# Patient Record
Sex: Male | Born: 1965 | Race: Black or African American | Hispanic: No | Smoking: Current some day smoker
Health system: Southern US, Community
[De-identification: ages and names within clinical notes are randomized; demographics above are authoritative.]

## PROBLEM LIST (undated history)

## (undated) DIAGNOSIS — K219 Gastro-esophageal reflux disease without esophagitis: Secondary | ICD-10-CM

## (undated) DIAGNOSIS — I509 Heart failure, unspecified: Secondary | ICD-10-CM

## (undated) DIAGNOSIS — I2510 Atherosclerotic heart disease of native coronary artery without angina pectoris: Secondary | ICD-10-CM

## (undated) DIAGNOSIS — I240 Acute coronary thrombosis not resulting in myocardial infarction: Secondary | ICD-10-CM

## (undated) DIAGNOSIS — E785 Hyperlipidemia, unspecified: Secondary | ICD-10-CM

## (undated) DIAGNOSIS — I10 Essential (primary) hypertension: Secondary | ICD-10-CM

## (undated) DIAGNOSIS — I255 Ischemic cardiomyopathy: Secondary | ICD-10-CM

## (undated) DIAGNOSIS — Z720 Tobacco use: Secondary | ICD-10-CM

## (undated) HISTORY — DX: Acute coronary thrombosis not resulting in myocardial infarction: I24.0

## (undated) HISTORY — PX: WISDOM TOOTH EXTRACTION: SHX21

---

## 1999-03-05 ENCOUNTER — Emergency Department (HOSPITAL_COMMUNITY): Admission: EM | Admit: 1999-03-05 | Discharge: 1999-03-05 | Payer: Self-pay

## 2003-07-27 ENCOUNTER — Emergency Department (HOSPITAL_COMMUNITY): Admission: EM | Admit: 2003-07-27 | Discharge: 2003-07-27 | Payer: Self-pay | Admitting: Emergency Medicine

## 2003-07-27 ENCOUNTER — Encounter: Payer: Self-pay | Admitting: Emergency Medicine

## 2004-01-03 ENCOUNTER — Emergency Department (HOSPITAL_COMMUNITY): Admission: EM | Admit: 2004-01-03 | Discharge: 2004-01-03 | Payer: Self-pay | Admitting: Emergency Medicine

## 2004-03-09 ENCOUNTER — Emergency Department (HOSPITAL_COMMUNITY): Admission: EM | Admit: 2004-03-09 | Discharge: 2004-03-09 | Payer: Self-pay | Admitting: Emergency Medicine

## 2004-03-09 ENCOUNTER — Emergency Department (HOSPITAL_COMMUNITY): Admission: EM | Admit: 2004-03-09 | Discharge: 2004-03-09 | Payer: Self-pay | Admitting: *Deleted

## 2007-09-08 ENCOUNTER — Emergency Department (HOSPITAL_COMMUNITY): Admission: EM | Admit: 2007-09-08 | Discharge: 2007-09-08 | Payer: Self-pay | Admitting: Emergency Medicine

## 2007-09-12 ENCOUNTER — Emergency Department (HOSPITAL_COMMUNITY): Admission: EM | Admit: 2007-09-12 | Discharge: 2007-09-12 | Payer: Self-pay | Admitting: Emergency Medicine

## 2008-07-25 ENCOUNTER — Emergency Department (HOSPITAL_COMMUNITY): Admission: EM | Admit: 2008-07-25 | Discharge: 2008-07-26 | Payer: Self-pay | Admitting: Emergency Medicine

## 2008-10-31 DIAGNOSIS — E1159 Type 2 diabetes mellitus with other circulatory complications: Secondary | ICD-10-CM

## 2008-10-31 HISTORY — DX: Type 2 diabetes mellitus with other circulatory complications: E11.59

## 2008-11-19 ENCOUNTER — Emergency Department (HOSPITAL_COMMUNITY): Admission: EM | Admit: 2008-11-19 | Discharge: 2008-11-20 | Payer: Self-pay | Admitting: Emergency Medicine

## 2008-12-08 ENCOUNTER — Emergency Department (HOSPITAL_COMMUNITY): Admission: EM | Admit: 2008-12-08 | Discharge: 2008-12-08 | Payer: Self-pay | Admitting: Emergency Medicine

## 2009-01-19 ENCOUNTER — Ambulatory Visit: Payer: Self-pay | Admitting: Nurse Practitioner

## 2009-01-19 DIAGNOSIS — F17200 Nicotine dependence, unspecified, uncomplicated: Secondary | ICD-10-CM

## 2009-01-19 LAB — CONVERTED CEMR LAB
Blood in Urine, dipstick: NEGATIVE
Glucose, Urine, Semiquant: >=1000
Hgb A1c MFr Bld: 13.9 %
Specific Gravity, Urine: 1.015
WBC Urine, dipstick: NEGATIVE
pH: 5

## 2009-01-20 ENCOUNTER — Encounter (INDEPENDENT_AMBULATORY_CARE_PROVIDER_SITE_OTHER): Payer: Self-pay | Admitting: Nurse Practitioner

## 2009-01-20 LAB — CONVERTED CEMR LAB
ALT: 19 U/L
AST: 12 U/L
Albumin: 4.5 g/dL
Alkaline Phosphatase: 98 U/L
BUN: 12 mg/dL
Basophils Absolute: 0 K/uL
Basophils Relative: 1 %
CO2: 25 meq/L
CRP: 0.6 mg/dL — ABNORMAL HIGH
Calcium: 9.5 mg/dL
Chloride: 97 meq/L
Cholesterol: 371 mg/dL — ABNORMAL HIGH
Creatinine, Ser: 1.13 mg/dL
Eosinophils Absolute: 0.1 K/uL
Eosinophils Relative: 2 %
Glucose, Bld: 362 mg/dL — ABNORMAL HIGH
HCT: 50.9 %
HDL: 37 mg/dL — ABNORMAL LOW
Hemoglobin: 17.7 g/dL — ABNORMAL HIGH
Insulin: 11 u[IU]/mL
Lymphocytes Relative: 44 %
Lymphs Abs: 2.3 K/uL
MCHC: 34.8 g/dL
MCV: 93.2 fL
Microalb, Ur: 6.09 mg/dL — ABNORMAL HIGH
Monocytes Absolute: 0.4 K/uL
Monocytes Relative: 8 %
Neutro Abs: 2.4 K/uL
Neutrophils Relative %: 46 %
PSA: 1.31 ng/mL
Platelets: 189 K/uL
Potassium: 4.8 meq/L
RBC: 5.46 M/uL
RDW: 13.5 %
Sodium: 135 meq/L
TSH: 0.859 u[IU]/mL
Total Bilirubin: 0.5 mg/dL
Total CHOL/HDL Ratio: 10
Total Protein: 7.3 g/dL
Triglycerides: 890 mg/dL — ABNORMAL HIGH
WBC: 5.2 10*3/microliter

## 2009-01-21 ENCOUNTER — Ambulatory Visit: Payer: Self-pay | Admitting: *Deleted

## 2009-01-27 ENCOUNTER — Emergency Department (HOSPITAL_COMMUNITY): Admission: EM | Admit: 2009-01-27 | Discharge: 2009-01-28 | Payer: Self-pay | Admitting: Emergency Medicine

## 2009-02-23 ENCOUNTER — Ambulatory Visit: Payer: Self-pay | Admitting: Nurse Practitioner

## 2009-02-23 LAB — CONVERTED CEMR LAB
Blood in Urine, dipstick: NEGATIVE
HDL goal, serum: 40 mg/dL
Hgb A1c MFr Bld: 13 %
LDL Goal: 100 mg/dL
Protein, U semiquant: 30
Urobilinogen, UA: 0.2
WBC Urine, dipstick: NEGATIVE

## 2009-03-22 ENCOUNTER — Emergency Department (HOSPITAL_COMMUNITY): Admission: EM | Admit: 2009-03-22 | Discharge: 2009-03-23 | Payer: Self-pay | Admitting: Emergency Medicine

## 2009-03-24 ENCOUNTER — Emergency Department (HOSPITAL_COMMUNITY): Admission: EM | Admit: 2009-03-24 | Discharge: 2009-03-25 | Payer: Self-pay | Admitting: Emergency Medicine

## 2009-04-18 ENCOUNTER — Emergency Department (HOSPITAL_COMMUNITY): Admission: EM | Admit: 2009-04-18 | Discharge: 2009-04-18 | Payer: Self-pay | Admitting: Emergency Medicine

## 2009-05-07 ENCOUNTER — Emergency Department (HOSPITAL_COMMUNITY): Admission: EM | Admit: 2009-05-07 | Discharge: 2009-05-07 | Payer: Self-pay | Admitting: Emergency Medicine

## 2010-06-22 ENCOUNTER — Emergency Department (HOSPITAL_COMMUNITY): Admission: EM | Admit: 2010-06-22 | Discharge: 2010-06-23 | Payer: Self-pay | Admitting: Emergency Medicine

## 2010-08-14 ENCOUNTER — Emergency Department (HOSPITAL_COMMUNITY): Admission: EM | Admit: 2010-08-14 | Discharge: 2010-08-14 | Payer: Self-pay | Admitting: Emergency Medicine

## 2010-08-17 ENCOUNTER — Emergency Department (HOSPITAL_COMMUNITY): Admission: EM | Admit: 2010-08-17 | Discharge: 2010-08-17 | Payer: Self-pay | Admitting: Emergency Medicine

## 2010-08-21 ENCOUNTER — Emergency Department (HOSPITAL_COMMUNITY): Admission: EM | Admit: 2010-08-21 | Discharge: 2010-08-21 | Payer: Self-pay | Admitting: Emergency Medicine

## 2011-01-21 ENCOUNTER — Encounter: Payer: Self-pay | Admitting: Cardiology

## 2011-01-24 ENCOUNTER — Emergency Department (HOSPITAL_COMMUNITY)
Admission: EM | Admit: 2011-01-24 | Discharge: 2011-01-24 | Payer: Self-pay | Source: Home / Self Care | Admitting: Emergency Medicine

## 2011-02-22 ENCOUNTER — Emergency Department (HOSPITAL_COMMUNITY)
Admission: EM | Admit: 2011-02-22 | Discharge: 2011-02-22 | Disposition: A | Discharge status: Home / Self Care | Payer: Medicaid Other | Attending: Emergency Medicine | Admitting: Emergency Medicine

## 2011-02-22 DIAGNOSIS — K089 Disorder of teeth and supporting structures, unspecified: Secondary | ICD-10-CM | POA: Insufficient documentation

## 2011-02-22 DIAGNOSIS — K029 Dental caries, unspecified: Secondary | ICD-10-CM | POA: Insufficient documentation

## 2011-02-22 DIAGNOSIS — E119 Type 2 diabetes mellitus without complications: Secondary | ICD-10-CM | POA: Insufficient documentation

## 2011-02-22 DIAGNOSIS — R220 Localized swelling, mass and lump, head: Secondary | ICD-10-CM | POA: Insufficient documentation

## 2011-02-22 DIAGNOSIS — R221 Localized swelling, mass and lump, neck: Secondary | ICD-10-CM | POA: Insufficient documentation

## 2011-02-22 DIAGNOSIS — Z79899 Other long term (current) drug therapy: Secondary | ICD-10-CM | POA: Insufficient documentation

## 2011-03-06 ENCOUNTER — Emergency Department (HOSPITAL_COMMUNITY)
Admission: EM | Admit: 2011-03-06 | Discharge: 2011-03-06 | Discharge status: Against Medical Advice | Payer: Medicaid Other | Attending: Emergency Medicine | Admitting: Emergency Medicine

## 2011-03-06 DIAGNOSIS — M79609 Pain in unspecified limb: Secondary | ICD-10-CM | POA: Insufficient documentation

## 2011-03-18 LAB — GLUCOSE, CAPILLARY: Glucose-Capillary: 449 mg/dL — ABNORMAL HIGH (ref 70–99)

## 2011-03-18 LAB — POCT I-STAT, CHEM 8
BUN: 15 mg/dL (ref 6–23)
Calcium, Ion: 1.11 mmol/L — ABNORMAL LOW (ref 1.12–1.32)
Chloride: 105 mEq/L (ref 96–112)
HCT: 45 % (ref 39.0–52.0)
Potassium: 3.9 mEq/L (ref 3.5–5.1)
Sodium: 137 mEq/L (ref 135–145)

## 2011-03-18 LAB — URINALYSIS, ROUTINE W REFLEX MICROSCOPIC
Bilirubin Urine: NEGATIVE
Glucose, UA: >1000 mg/dL — AB
Ketones, ur: NEGATIVE mg/dL
Leukocytes, UA: NEGATIVE
Protein, ur: NEGATIVE mg/dL

## 2011-03-27 ENCOUNTER — Emergency Department (HOSPITAL_COMMUNITY)
Admission: EM | Admit: 2011-03-27 | Discharge: 2011-03-27 | Disposition: A | Discharge status: Home / Self Care | Payer: Medicaid Other | Attending: Emergency Medicine | Admitting: Emergency Medicine

## 2011-03-27 DIAGNOSIS — K089 Disorder of teeth and supporting structures, unspecified: Secondary | ICD-10-CM | POA: Insufficient documentation

## 2011-03-27 DIAGNOSIS — E119 Type 2 diabetes mellitus without complications: Secondary | ICD-10-CM | POA: Insufficient documentation

## 2011-04-11 LAB — GLUCOSE, CAPILLARY: Glucose-Capillary: 343 mg/dL — ABNORMAL HIGH (ref 70–99)

## 2011-04-12 LAB — DIFFERENTIAL
Basophils Absolute: 0 10*3/uL (ref 0.0–0.1)
Eosinophils Relative: 1 % (ref 0–5)
Lymphocytes Relative: 18 % (ref 12–46)
Lymphs Abs: 1.6 10*3/uL (ref 0.7–4.0)
Neutro Abs: 6.4 10*3/uL (ref 1.7–7.7)
Neutrophils Relative %: 72 % (ref 43–77)

## 2011-04-12 LAB — COMPREHENSIVE METABOLIC PANEL
AST: 36 U/L (ref 0–37)
Albumin: 2.9 g/dL — ABNORMAL LOW (ref 3.5–5.2)
Alkaline Phosphatase: 102 U/L (ref 39–117)
BUN: 10 mg/dL (ref 6–23)
Chloride: 101 mEq/L (ref 96–112)
GFR calc Af Amer: >60 mL/min (ref >60)
Potassium: 4.8 mEq/L (ref 3.5–5.1)
Total Bilirubin: 1.6 mg/dL — ABNORMAL HIGH (ref 0.3–1.2)
Total Protein: 6.6 g/dL (ref 6.0–8.3)

## 2011-04-12 LAB — CBC
Platelets: 257 10*3/uL (ref 150–400)
RDW: 13 % (ref 11.5–15.5)
WBC: 9 10*3/uL (ref 4.0–10.5)

## 2011-04-12 LAB — GLUCOSE, CAPILLARY: Glucose-Capillary: 494 mg/dL — ABNORMAL HIGH (ref 70–99)

## 2011-04-12 LAB — POCT I-STAT, CHEM 8
Calcium, Ion: 1.15 mmol/L (ref 1.12–1.32)
Chloride: 102 mEq/L (ref 96–112)
Creatinine, Ser: 1 mg/dL (ref 0.4–1.5)
Glucose, Bld: 423 mg/dL — ABNORMAL HIGH (ref 70–99)
HCT: 44 % (ref 39.0–52.0)
Potassium: 4.1 mEq/L (ref 3.5–5.1)

## 2011-04-12 LAB — KETONES, QUALITATIVE: Acetone, Bld: NEGATIVE

## 2011-04-16 LAB — GLUCOSE, CAPILLARY: Glucose-Capillary: 342 mg/dL — ABNORMAL HIGH (ref 70–99)

## 2011-07-17 ENCOUNTER — Emergency Department (HOSPITAL_COMMUNITY)
Admission: EM | Admit: 2011-07-17 | Discharge: 2011-07-18 | Disposition: A | Discharge status: Home / Self Care | Payer: Medicaid Other | Attending: Emergency Medicine | Admitting: Emergency Medicine

## 2011-07-17 ENCOUNTER — Emergency Department (HOSPITAL_COMMUNITY)
Admission: EM | Admit: 2011-07-17 | Discharge: 2011-07-17 | Disposition: A | Discharge status: Home / Self Care | Payer: Medicaid Other | Attending: Emergency Medicine | Admitting: Emergency Medicine

## 2011-07-17 DIAGNOSIS — L03319 Cellulitis of trunk, unspecified: Secondary | ICD-10-CM | POA: Insufficient documentation

## 2011-07-17 DIAGNOSIS — E119 Type 2 diabetes mellitus without complications: Secondary | ICD-10-CM | POA: Insufficient documentation

## 2011-07-17 DIAGNOSIS — N498 Inflammatory disorders of other specified male genital organs: Secondary | ICD-10-CM | POA: Insufficient documentation

## 2011-07-17 DIAGNOSIS — L02219 Cutaneous abscess of trunk, unspecified: Secondary | ICD-10-CM | POA: Insufficient documentation

## 2011-07-17 LAB — GLUCOSE, CAPILLARY: Glucose-Capillary: 333 mg/dL — ABNORMAL HIGH (ref 70–99)

## 2011-08-08 ENCOUNTER — Emergency Department (HOSPITAL_COMMUNITY)
Admission: EM | Admit: 2011-08-08 | Discharge: 2011-08-08 | Disposition: A | Discharge status: Home / Self Care | Payer: Medicaid Other | Attending: Emergency Medicine | Admitting: Emergency Medicine

## 2011-08-08 DIAGNOSIS — E119 Type 2 diabetes mellitus without complications: Secondary | ICD-10-CM | POA: Insufficient documentation

## 2011-08-08 DIAGNOSIS — L509 Urticaria, unspecified: Secondary | ICD-10-CM | POA: Insufficient documentation

## 2011-10-02 LAB — BLOOD GAS, VENOUS
FIO2: 0.21
Patient temperature: 98.6
TCO2: 22.3
pCO2, Ven: 45.6
pH, Ven: 7.362 — ABNORMAL HIGH

## 2011-10-02 LAB — CBC
HCT: 48.1
Hemoglobin: 16.4
Platelets: 178
WBC: 7.2

## 2011-10-02 LAB — DIFFERENTIAL
Eosinophils Relative: 2
Lymphocytes Relative: 23
Lymphs Abs: 1.7
Monocytes Relative: 10

## 2011-10-02 LAB — URINALYSIS, ROUTINE W REFLEX MICROSCOPIC
Glucose, UA: >1000 — AB
Leukocytes, UA: NEGATIVE
pH: 5.5

## 2011-10-02 LAB — BASIC METABOLIC PANEL
BUN: 11
Calcium: 9.7
GFR calc non Af Amer: >60
Potassium: 4.1
Sodium: 130 — ABNORMAL LOW

## 2011-10-02 LAB — GLUCOSE, CAPILLARY: Glucose-Capillary: 149 — ABNORMAL HIGH

## 2011-10-02 LAB — URINE MICROSCOPIC-ADD ON: Urine-Other: NONE SEEN

## 2011-10-02 LAB — POCT CARDIAC MARKERS

## 2011-10-05 LAB — GLUCOSE, CAPILLARY: Glucose-Capillary: 482 mg/dL — ABNORMAL HIGH (ref 70–99)

## 2011-10-30 ENCOUNTER — Emergency Department (HOSPITAL_COMMUNITY): Payer: Medicaid Other

## 2011-10-30 ENCOUNTER — Emergency Department (HOSPITAL_COMMUNITY)
Admission: EM | Admit: 2011-10-30 | Discharge: 2011-10-30 | Disposition: A | Discharge status: Home / Self Care | Payer: Medicaid Other | Attending: Emergency Medicine | Admitting: Emergency Medicine

## 2011-10-30 ENCOUNTER — Encounter (HOSPITAL_COMMUNITY): Payer: Self-pay

## 2011-10-30 DIAGNOSIS — L03211 Cellulitis of face: Secondary | ICD-10-CM | POA: Insufficient documentation

## 2011-10-30 DIAGNOSIS — L0201 Cutaneous abscess of face: Secondary | ICD-10-CM | POA: Insufficient documentation

## 2011-10-30 DIAGNOSIS — M8448XA Pathological fracture, other site, initial encounter for fracture: Secondary | ICD-10-CM | POA: Insufficient documentation

## 2011-10-30 DIAGNOSIS — E119 Type 2 diabetes mellitus without complications: Secondary | ICD-10-CM | POA: Insufficient documentation

## 2011-10-30 DIAGNOSIS — J329 Chronic sinusitis, unspecified: Secondary | ICD-10-CM | POA: Insufficient documentation

## 2011-10-30 DIAGNOSIS — Z79899 Other long term (current) drug therapy: Secondary | ICD-10-CM | POA: Insufficient documentation

## 2011-10-30 LAB — CBC
MCH: 31.7 pg (ref 26.0–34.0)
MCV: 91.3 fL (ref 78.0–100.0)
Platelets: 203 10*3/uL (ref 150–400)
RBC: 4.96 MIL/uL (ref 4.22–5.81)
RDW: 13 % (ref 11.5–15.5)
WBC: 8.1 10*3/uL (ref 4.0–10.5)

## 2011-10-30 LAB — POCT I-STAT, CHEM 8
Calcium, Ion: 1.15 mmol/L (ref 1.12–1.32)
Glucose, Bld: 144 mg/dL — ABNORMAL HIGH (ref 70–99)
HCT: 47 % (ref 39.0–52.0)
TCO2: 26 mmol/L (ref 0–100)

## 2011-10-30 LAB — DIFFERENTIAL
Basophils Relative: 0 % (ref 0–1)
Eosinophils Absolute: 0.2 10*3/uL (ref 0.0–0.7)
Eosinophils Relative: 3 % (ref 0–5)
Lymphs Abs: 3 10*3/uL (ref 0.7–4.0)
Neutrophils Relative %: 48 % (ref 43–77)

## 2011-10-30 LAB — GLUCOSE, CAPILLARY: Glucose-Capillary: 137 mg/dL — ABNORMAL HIGH (ref 70–99)

## 2012-01-14 ENCOUNTER — Encounter (HOSPITAL_COMMUNITY): Payer: Self-pay

## 2012-01-14 ENCOUNTER — Emergency Department (HOSPITAL_COMMUNITY)
Admission: EM | Admit: 2012-01-14 | Discharge: 2012-01-14 | Discharge status: Against Medical Advice | Payer: Medicaid Other | Attending: Emergency Medicine | Admitting: Emergency Medicine

## 2012-01-14 DIAGNOSIS — L0291 Cutaneous abscess, unspecified: Secondary | ICD-10-CM | POA: Insufficient documentation

## 2012-01-14 NOTE — ED Notes (Signed)
Pt decided to leave and to call doctor in the morning

## 2012-01-14 NOTE — ED Notes (Signed)
Boil to rt side of neck and on head-  Rt neck boil with pus drainage

## 2012-04-01 ENCOUNTER — Emergency Department (HOSPITAL_COMMUNITY)
Admission: EM | Admit: 2012-04-01 | Discharge: 2012-04-01 | Disposition: A | Discharge status: Home / Self Care | Payer: Medicaid Other | Attending: Emergency Medicine | Admitting: Emergency Medicine

## 2012-04-01 ENCOUNTER — Encounter (HOSPITAL_COMMUNITY): Payer: Self-pay

## 2012-04-01 DIAGNOSIS — K029 Dental caries, unspecified: Secondary | ICD-10-CM | POA: Insufficient documentation

## 2012-04-01 DIAGNOSIS — K0889 Other specified disorders of teeth and supporting structures: Secondary | ICD-10-CM

## 2012-04-01 DIAGNOSIS — F17200 Nicotine dependence, unspecified, uncomplicated: Secondary | ICD-10-CM | POA: Insufficient documentation

## 2012-04-01 DIAGNOSIS — E119 Type 2 diabetes mellitus without complications: Secondary | ICD-10-CM | POA: Insufficient documentation

## 2012-04-01 MED ORDER — PENICILLIN V POTASSIUM 500 MG PO TABS: 500.0000 mg | ORAL_TABLET | Freq: Four times a day (QID) | ORAL | Status: AC

## 2012-04-01 MED ORDER — OXYCODONE-ACETAMINOPHEN 5-325 MG PO TABS: 1.0000 | ORAL_TABLET | Freq: Four times a day (QID) | ORAL | Status: AC | PRN

## 2012-04-01 MED ORDER — OXYCODONE-ACETAMINOPHEN 5-325 MG PO TABS
2.0000 | ORAL_TABLET | Freq: Once | ORAL | Status: AC
  Administered 2012-04-01: 2 via ORAL
  Filled 2012-04-01: qty 2

## 2012-04-01 NOTE — ED Provider Notes (Signed)
Medical screening examination/treatment/procedure(s) were performed by non-physician practitioner and as supervising physician I was immediately available for consultation/collaboration.   Dayton Bailiff, MD 04/01/12 [phone]

## 2012-04-01 NOTE — ED Provider Notes (Signed)
History     CSN: [id number]  Arrival date & time 04/01/12  2041   First MD Initiated Contact with Patient 04/01/12 2145      Chief Complaint  Patient presents with  . Dental Pain    (Consider location/radiation/quality/duration/timing/severity/associated sxs/prior treatment) HPI Comments: Patient does not have a dentist.  Dental pain of the right lower premolar teeth.  Patient is a 47 y.o. male presenting with tooth pain. The history is provided by the patient.  Dental PainPrimary symptoms do not include dental injury, oral bleeding, oral lesions, fever or shortness of breath. The symptoms began yesterday. The symptoms are worsening. The symptoms occur constantly.  Additional symptoms include: gum swelling and gum tenderness. Additional symptoms do not include: purulent gums, trismus, facial swelling, trouble swallowing, drooling and ear pain.    Past Medical History  Diagnosis Date  . Diabetes mellitus     History reviewed. No pertinent past surgical history.  History reviewed. No pertinent family history.  History  Substance Use Topics  . Smoking status: Current Everyday Smoker  . Smokeless tobacco: Not on file  . Alcohol Use: Yes      Review of Systems  Constitutional: Negative for fever and chills.  HENT: Positive for dental problem. Negative for ear pain, facial swelling, drooling, trouble swallowing, neck pain and neck stiffness.   Respiratory: Negative for shortness of breath.   Gastrointestinal: Negative for vomiting.  Skin: Negative for color change.    Allergies  Review of patient's allergies indicates no known allergies.  Home Medications   Current Outpatient Rx  Name Route Sig Dispense Refill  . METFORMIN HCL 1000 MG PO TABS Oral Take 1,000 mg by mouth once.      BP 153/98  Pulse 88  Temp(Src) 99 F (37.2 C) (Oral)  Resp 20  SpO2 97%  Physical Exam  Nursing note and vitals reviewed. Constitutional: He is oriented to person, place, and  time. He appears well-developed and well-nourished. No distress.  HENT:  Head: Normocephalic and atraumatic. No trismus in the jaw.  Mouth/Throat: Uvula is midline, oropharynx is clear and moist and mucous membranes are normal. Abnormal dentition. Dental caries present. No dental abscesses or uvula swelling. No oropharyngeal exudate, posterior oropharyngeal edema, posterior oropharyngeal erythema or tonsillar abscesses.       Poor dental hygiene. Pt able to open and close mouth with out difficulty. Airway intact. Uvula midline. Mild gingival swelling with tenderness over affected area, but no fluctuance. No swelling or tenderness of submental and submandibular regions. Right lower premolar tooth decayed and broken.  Eyes: Conjunctivae and EOM are normal.  Neck: Normal range of motion and full passive range of motion without pain. Neck supple.  Cardiovascular: Normal rate and regular rhythm.   Pulmonary/Chest: Effort normal and breath sounds normal. No stridor. No respiratory distress. He has no wheezes.  Musculoskeletal: Normal range of motion.  Lymphadenopathy:       Head (right side): No submental, no submandibular, no tonsillar, no preauricular and no posterior auricular adenopathy present.       Head (left side): No submental, no submandibular, no tonsillar, no preauricular and no posterior auricular adenopathy present.    He has no cervical adenopathy.  Neurological: He is alert and oriented to person, place, and time.  Skin: Skin is warm and dry. No rash noted. He is not diaphoretic.    ED Course  Procedures (including critical care time)  Labs Reviewed - No data to display No results found.   No  diagnosis found.    MDM  Patient with toothache.  No gross abscess.  Exam unconcerning for Ludwig's angina or spread of infection.  Will treat with penicillin and pain medicine.  Urged patient to follow-up with dentist.          Pascal Lux Atco, PA-C 04/01/12 2236

## 2012-04-01 NOTE — ED Notes (Signed)
Pt complains of a tooth abcess since yesterday

## 2012-04-01 NOTE — Discharge Instructions (Signed)
You have a dental injury. Use the resource guide listed below to help you find a dentist if you do not already have one to followup with. It is very important that you get evaluated by a dentist as soon as possible. Call tomorrow to schedule an appointment. Use your pain medication as prescribed and do not operate heavy machinery while on pain medication. Note that your pain medication contains acetaminophen (Tylenol) & its is not recommended that you use additional acetaminophen (Tylenol) while taking this medication. Take your full course of antibiotics. Read the instructions below. ° °Eat a soft or liquid diet and rinse your mouth out after meals with warm water. You should see a dentist or return here at once if you have increased swelling, increased pain or uncontrolled bleeding from the site of your injury. ° ° °SEEK MEDICAL CARE IF:  °· You have increased pain not controlled with medicines.  °· You have swelling around your tooth, in your face or neck.  °· You have bleeding which starts, continues, or gets worse.  °· You have a fever >101 °· If you are unable to open your mouth ° °RESOURCE GUIDE ° °Dental Problems ° °Patients with Medicaid: °[facility]                     [facility] °[address]                                           [address] °Phone:  [phone]                                                  Phone:  510-2600 ° °If unable to pay or uninsured, contact:  [facility] or [facility] to become qualified for the adult dental clinic. ° °Chronic Pain Problems °Contact [facility]  [phone] °Patients need to be referred by their primary care doctor. ° °Insufficient Money for Medicine °Contact [employer]:  call "211" or [facility] 271-5999. ° °No Primary Care Doctor °Call [facility]  832-8000 °Other agencies that provide inexpensive medical care °   [facility]  832-8035 °   [facility]  832-7272 °   [facility]  [phone] °   [facility]  832-4777 °   [facility]  373-0678 °   [facility]  272-1050 ° °Psychological Services °[facility]  [phone] °[facility]  [phone] °[facility]   [phone] (emergency services 641-4993) ° °Substance Abuse Resources °[facility]  336-882-2125 °[facility] 336-784-9470 °[facility] 336-285-9073 °[facility] 336-845-3988 °Residential & Outpatient Substance Abuse Program  800-659-3381 ° °Abuse/Neglect °[place] [facility] (336) 641-3795 °[place] [facility] 800-378-5315 (After Hours) ° °Emergency Shelter °[facility] (336) 271-5985 ° °Maternity Homes °[facility] (336) 275-9566 °[facility] (704) 372-4663 ° °MRSA Hotline #:   832-7006 ° ° ° °[place] [facility] ° °[facility] [facility]     [employer]                            [facility] °[address] [place]                       [address]      [address]  °[place]                                                [place]                            [place] °Phone:  349-3220                                   Phone:  [phone]                 Phone:  342-8140 ° °[facility] °Phone:  342-8316 ° °[place] [facility] Hot[facility] °(336) 342-1394 °(336) 342-3537 (After Hours) ° ° ° ° °

## 2012-06-06 ENCOUNTER — Emergency Department (HOSPITAL_COMMUNITY)
Admission: EM | Admit: 2012-06-06 | Discharge: 2012-06-06 | Disposition: A | Discharge status: Home / Self Care | Payer: Medicaid Other | Attending: Emergency Medicine | Admitting: Emergency Medicine

## 2012-06-06 ENCOUNTER — Emergency Department (HOSPITAL_COMMUNITY): Payer: Medicaid Other

## 2012-06-06 ENCOUNTER — Encounter (HOSPITAL_COMMUNITY): Payer: Self-pay | Admitting: *Deleted

## 2012-06-06 DIAGNOSIS — E119 Type 2 diabetes mellitus without complications: Secondary | ICD-10-CM | POA: Insufficient documentation

## 2012-06-06 DIAGNOSIS — Z79899 Other long term (current) drug therapy: Secondary | ICD-10-CM | POA: Insufficient documentation

## 2012-06-06 DIAGNOSIS — F17200 Nicotine dependence, unspecified, uncomplicated: Secondary | ICD-10-CM | POA: Insufficient documentation

## 2012-06-06 DIAGNOSIS — R079 Chest pain, unspecified: Secondary | ICD-10-CM | POA: Insufficient documentation

## 2012-06-06 DIAGNOSIS — R0789 Other chest pain: Secondary | ICD-10-CM

## 2012-06-06 LAB — COMPREHENSIVE METABOLIC PANEL
AST: 9 U/L (ref 0–37)
Albumin: 3.6 g/dL (ref 3.5–5.2)
BUN: 5 mg/dL — ABNORMAL LOW (ref 6–23)
Chloride: 99 mEq/L (ref 96–112)
Creatinine, Ser: 0.88 mg/dL (ref 0.50–1.35)
Potassium: 3.1 mEq/L — ABNORMAL LOW (ref 3.5–5.1)
Total Bilirubin: 0.4 mg/dL (ref 0.3–1.2)
Total Protein: 6.9 g/dL (ref 6.0–8.3)

## 2012-06-06 LAB — DIFFERENTIAL
Basophils Absolute: 0 10*3/uL (ref 0.0–0.1)
Basophils Relative: 0 % (ref 0–1)
Eosinophils Absolute: 0.2 10*3/uL (ref 0.0–0.7)
Monocytes Absolute: 0.7 10*3/uL (ref 0.1–1.0)
Neutro Abs: 4 10*3/uL (ref 1.7–7.7)
Neutrophils Relative %: 58 % (ref 43–77)

## 2012-06-06 LAB — CBC
MCH: 31.2 pg (ref 26.0–34.0)
MCHC: 34.8 g/dL (ref 30.0–36.0)
RDW: 12.9 % (ref 11.5–15.5)

## 2012-06-06 LAB — POCT I-STAT TROPONIN I: Troponin i, poc: 0 ng/mL (ref 0.00–0.08)

## 2012-06-06 LAB — LIPASE, BLOOD: Lipase: 16 U/L (ref 11–59)

## 2012-06-06 MED ORDER — POTASSIUM CHLORIDE CRYS ER 20 MEQ PO TBCR
40.0000 meq | EXTENDED_RELEASE_TABLET | Freq: Once | ORAL | Status: AC
  Administered 2012-06-06: 40 meq via ORAL
  Filled 2012-06-06: qty 2

## 2012-06-06 MED ORDER — SODIUM CHLORIDE 0.9 % IV SOLN
Freq: Once | INTRAVENOUS | Status: AC
  Administered 2012-06-06: 04:00:00 via INTRAVENOUS

## 2012-06-06 MED ORDER — KETOROLAC TROMETHAMINE 30 MG/ML IJ SOLN: 30.0000 mg | Freq: Once | INTRAMUSCULAR | Status: DC

## 2012-06-06 MED ORDER — IBUPROFEN 600 MG PO TABS: 600.0000 mg | ORAL_TABLET | Freq: Four times a day (QID) | ORAL | Status: AC | PRN

## 2012-06-06 MED ORDER — IBUPROFEN 200 MG PO TABS
400.0000 mg | ORAL_TABLET | Freq: Once | ORAL | Status: AC
  Administered 2012-06-06: 400 mg via ORAL
  Filled 2012-06-06: qty 2

## 2012-06-06 MED ORDER — LORAZEPAM 2 MG/ML IJ SOLN
1.0000 mg | Freq: Once | INTRAMUSCULAR | Status: AC
  Administered 2012-06-06: 1 mg via INTRAVENOUS
  Filled 2012-06-06: qty 1

## 2012-06-06 MED ORDER — METHOCARBAMOL 750 MG PO TABS: 750.0000 mg | ORAL_TABLET | Freq: Four times a day (QID) | ORAL | Status: AC

## 2012-06-06 NOTE — ED Notes (Signed)
Pt c/o chest pain starting at 1700; mainly left sided; denies shortness of breath; no known injury

## 2012-06-06 NOTE — Discharge Instructions (Signed)
Chest Wall Pain  Chest wall pain is pain in or around the bones and muscles of your chest. It may take up to 6 weeks to get better. It may take longer if you must stay physically active in your work and activities.   CAUSES   Chest wall pain may happen on its own. However, it may be caused by:   A viral illness like the flu.   Injury.   Coughing.   Exercise.   Arthritis.   Fibromyalgia.   Shingles.  HOME CARE INSTRUCTIONS    Avoid overtiring physical activity. Try not to strain or perform activities that cause pain. This includes any activities using your chest or your abdominal and side muscles, especially if heavy weights are used.   Put ice on the sore area.   Put ice in a plastic bag.   Place a towel between your skin and the bag.   Leave the ice on for 15 to 20 minutes per hour while awake for the first 2 days.   Only take over-the-counter or prescription medicines for pain, discomfort, or fever as directed by your caregiver.  SEEK IMMEDIATE MEDICAL CARE IF:    Your pain increases, or you are very uncomfortable.   You have a fever.   Your chest pain becomes worse.   You have new, unexplained symptoms.   You have nausea or vomiting.   You feel sweaty or lightheaded.   You have a cough with phlegm (sputum), or you cough up blood.  MAKE SURE YOU:    Understand these instructions.   Will watch your condition.   Will get help right away if you are not doing well or get worse.  Document Released: 12/17/2005 Document Revised: 12/06/2011 Document Reviewed: 08/13/2011  ExitCare Patient Information 2012 [employer]    Chest Pain (Nonspecific)  It is often hard to give a specific diagnosis for the cause of chest pain. There is always a chance that your pain could be related to something serious, such as a heart attack or a blood clot in the lungs. You need to follow up with your caregiver for further evaluation.  CAUSES    Heartburn.   Pneumonia or bronchitis.   Anxiety or  stress.   Inflammation around your heart (pericarditis) or lung (pleuritis or pleurisy).   A blood clot in the lung.   A collapsed lung (pneumothorax). It can develop suddenly on its own (spontaneous pneumothorax) or from injury (trauma) to the chest.   Shingles infection (herpes zoster virus).  The chest wall is composed of bones, muscles, and cartilage. Any of these can be the source of the pain.   The bones can be bruised by injury.   The muscles or cartilage can be strained by coughing or overwork.   The cartilage can be affected by inflammation and become sore (costochondritis).  DIAGNOSIS   Lab tests or other studies, such as X-rays, electrocardiography, stress testing, or cardiac imaging, may be needed to find the cause of your pain.   TREATMENT    Treatment depends on what may be causing your chest pain. Treatment may include:   Acid blockers for heartburn.   Anti-inflammatory medicine.   Pain medicine for inflammatory conditions.   Antibiotics if an infection is present.   You may be advised to change lifestyle habits. This includes stopping smoking and avoiding alcohol, caffeine, and chocolate.   You may be advised to keep your head raised (elevated) when sleeping. This reduces the chance of   acid going backward from your stomach into your esophagus.   Most of the time, nonspecific chest pain will improve within 2 to 3 days with rest and mild pain medicine.  HOME CARE INSTRUCTIONS    If antibiotics were prescribed, take your antibiotics as directed. Finish them even if you start to feel better.   For the next few days, avoid physical activities that bring on chest pain. Continue physical activities as directed.   Do not smoke.   Avoid drinking alcohol.   Only take over-the-counter or prescription medicine for pain, discomfort, or fever as directed by your caregiver.   Follow your caregiver's suggestions for further testing if your chest pain does not go away.   Keep any follow-up  appointments you made. If you do not go to an appointment, you could develop lasting (chronic) problems with pain. If there is any problem keeping an appointment, you must call to reschedule.  SEEK MEDICAL CARE IF:    You think you are having problems from the medicine you are taking. Read your medicine instructions carefully.   Your chest pain does not go away, even after treatment.   You develop a rash with blisters on your chest.  SEEK IMMEDIATE MEDICAL CARE IF:    You have increased chest pain or pain that spreads to your arm, neck, jaw, back, or abdomen.   You develop shortness of breath, an increasing cough, or you are coughing up blood.   You have severe back or abdominal pain, feel nauseous, or vomit.   You develop severe weakness, fainting, or chills.   You have a fever.  THIS IS AN EMERGENCY. Do not wait to see if the pain will go away. Get medical help at once. Call your local emergency services (911 in [place] Do not drive yourself to the hospital.  MAKE SURE YOU:    Understand these instructions.   Will watch your condition.   Will get help right away if you are not doing well or get worse.  Document Released: 09/26/2005 Document Revised: 12/06/2011 Document Reviewed: 07/22/2008  Exit[employer] Patient Information 2012 [employer]

## 2012-06-06 NOTE — ED Provider Notes (Signed)
History     CSN: [id number]  Arrival date & time 06/06/12  0240   First MD Initiated Contact with Patient 06/06/12 0309      Chief Complaint  Patient presents with  . Chest Pain    (Consider location/radiation/quality/duration/timing/severity/associated sxs/prior treatment) Patient is a 47 y.o. male presenting with chest pain. The history is provided by the patient.  Chest Pain    Patient here with constant left-sided chest pain x8 hours. Pain is worse with certain positions and better with remaining still. Patient denies any associated diaphoresis or dyspnea. No nausea vomiting. No medications taken for this prior to arrival. No recent fever or cough symptoms. Pain has been non- exertional. No prior history of same and patient denies any recent injury Past Medical History  Diagnosis Date  . Diabetes mellitus     History reviewed. No pertinent past surgical history.  History reviewed. No pertinent family history.  History  Substance Use Topics  . Smoking status: Current Everyday Smoker  . Smokeless tobacco: Not on file  . Alcohol Use: Yes      Review of Systems  Cardiovascular: Positive for chest pain.  All other systems reviewed and are negative.    Allergies  Review of patient's allergies indicates no known allergies.  Home Medications   Current Outpatient Rx  Name Route Sig Dispense Refill  . METFORMIN HCL 1000 MG PO TABS Oral Take 1,000 mg by mouth once.      BP 182/118  Pulse 71  Temp(Src) 98.4 F (36.9 C) (Oral)  Resp 24  Wt 192 lb 12.8 oz (87.454 kg)  SpO2 99%  Physical Exam  Nursing note and vitals reviewed. Constitutional: He is oriented to person, place, and time. He appears well-developed and well-nourished.  Non-toxic appearance. No distress.  HENT:  Head: Normocephalic and atraumatic.  Eyes: Conjunctivae, EOM and lids are normal. Pupils are equal, round, and reactive to light.  Neck: Normal range of motion. Neck supple. No tracheal  deviation present. No mass present.  Cardiovascular: Normal rate, regular rhythm and normal heart sounds.  Exam reveals no gallop.   No murmur heard. Pulmonary/Chest: Effort normal and breath sounds normal. No stridor. No respiratory distress. He has no decreased breath sounds. He has no wheezes. He has no rhonchi. He has no rales. He exhibits tenderness and bony tenderness. He exhibits no crepitus.    Abdominal: Soft. Normal appearance and bowel sounds are normal. He exhibits no distension. There is no tenderness. There is no rebound and no CVA tenderness.  Musculoskeletal: Normal range of motion. He exhibits no edema and no tenderness.  Neurological: He is alert and oriented to person, place, and time. He has normal strength. No cranial nerve deficit or sensory deficit. GCS eye subscore is 4. GCS verbal subscore is 5. GCS motor subscore is 6.  Skin: Skin is warm and dry. No abrasion and no rash noted.  Psychiatric: He has a normal mood and affect. His speech is normal and behavior is normal.    ED Course  Procedures (including critical care time)   Labs Reviewed  CBC  DIFFERENTIAL  COMPREHENSIVE METABOLIC PANEL  LIPASE, BLOOD   No results found.   No diagnosis found.    MDM   Date: 06/06/2012  Rate: 75  Rhythm: normal sinus rhythm  QRS Axis: normal  Intervals: normal  ST/T Wave abnormalities: nonspecific ST/T changes  Conduction Disutrbances:none  Narrative Interpretation:   Old EKG Reviewed: unchanged      5:01 AM Patient  with out evidence of ACS at this time. Was given medications here for his chest wall pain does flow better. Will be discharged home at this time. No concern for pulmonary embolism  Toy Baker, MD 06/06/12 2956

## 2012-06-06 NOTE — ED Notes (Signed)
Patient transported to X-ray 

## 2012-08-11 ENCOUNTER — Encounter (HOSPITAL_COMMUNITY): Payer: Self-pay | Admitting: Emergency Medicine

## 2012-08-11 ENCOUNTER — Emergency Department (HOSPITAL_COMMUNITY)
Admission: EM | Admit: 2012-08-11 | Discharge: 2012-08-11 | Disposition: A | Discharge status: Home / Self Care | Payer: Medicaid Other | Attending: Emergency Medicine | Admitting: Emergency Medicine

## 2012-08-11 DIAGNOSIS — L0390 Cellulitis, unspecified: Secondary | ICD-10-CM

## 2012-08-11 DIAGNOSIS — L02419 Cutaneous abscess of limb, unspecified: Secondary | ICD-10-CM | POA: Insufficient documentation

## 2012-08-11 DIAGNOSIS — F17200 Nicotine dependence, unspecified, uncomplicated: Secondary | ICD-10-CM | POA: Insufficient documentation

## 2012-08-11 DIAGNOSIS — E119 Type 2 diabetes mellitus without complications: Secondary | ICD-10-CM | POA: Insufficient documentation

## 2012-08-11 DIAGNOSIS — W57XXXA Bitten or stung by nonvenomous insect and other nonvenomous arthropods, initial encounter: Secondary | ICD-10-CM

## 2012-08-11 LAB — GLUCOSE, CAPILLARY: Glucose-Capillary: 142 mg/dL — ABNORMAL HIGH (ref 70–99)

## 2012-08-11 MED ORDER — ONDANSETRON HCL 4 MG PO TABS: 4.0000 mg | ORAL_TABLET | Freq: Four times a day (QID) | ORAL | Status: AC

## 2012-08-11 MED ORDER — CLINDAMYCIN HCL 150 MG PO CAPS: 150.0000 mg | ORAL_CAPSULE | Freq: Four times a day (QID) | ORAL | Status: AC

## 2012-08-11 MED ORDER — HYDROCODONE-ACETAMINOPHEN 5-500 MG PO TABS: 1.0000 | ORAL_TABLET | Freq: Four times a day (QID) | ORAL | Status: AC | PRN

## 2012-08-11 NOTE — ED Provider Notes (Signed)
History     CSN: [id number]  Arrival date & time 08/11/12  1415   First MD Initiated Contact with Patient 08/11/12 1651      Chief Complaint  Patient presents with  . Abscess  . Insect Bite    (Consider location/radiation/quality/duration/timing/severity/associated sxs/prior treatment) HPI  Patient presents to the emergency Department with complaints of rash on the inside of the left leg. He states that he is bit by an insect on Thursday and he continued to get worse. He states that this morning he noticed there is a pimple with pus in it popped and drained. After seeing that he decided that he needed to come in and get reevaluated. The patient is a diabetic and he admits that he has been taking his medications and that his sugars are under control. He denies symptomatic symptoms fever, nausea, vomiting, chills, disorientation. His vital signs are stable and within normal limits.  Past Medical History  Diagnosis Date  . Diabetes mellitus     History reviewed. No pertinent past surgical history.  No family history on file.  History  Substance Use Topics  . Smoking status: Current Everyday Smoker -- 1.0 packs/day  . Smokeless tobacco: Not on file  . Alcohol Use: No      Review of Systems   HEENT: denies blurry vision or change in hearing PULMONARY: Denies difficulty breathing and SOB CARDIAC: denies chest pain or heart palpitations MUSCULOSKELETAL:  denies being unable to ambulate ABDOMEN AL: denies abdominal pain GU: denies loss of bowel or urinary control NEUR+ new rash to inside of left lower extremity PSYCH: patient denies anxiety or depression. NECK: Pt denies having neck pain     Allergies  Review of patient's allergies indicates no known allergies.  Home Medications  No current outpatient prescriptions on file.  BP 152/108  Pulse 69  Temp 98.5 F (36.9 C) (Oral)  Resp 19  Wt 182 lb 3.2 oz (82.645 kg)  SpO2 99%  Physical Exam  Nursing note and  vitals reviewed. Constitutional: He appears well-developed and well-nourished. No distress.  HENT:  Head: Normocephalic and atraumatic.  Eyes: Pupils are equal, round, and reactive to light.  Neck: Normal range of motion. Neck supple.  Cardiovascular: Normal rate and regular rhythm.   Pulmonary/Chest: Effort normal.  Abdominal: Soft.  Neurological: He is alert.  Skin: Skin is warm and dry.          Small 2 x 3 area of erythema with center pustule that is opened and drained. When expressed only blood comes out. Very tender, no underlying crepitus.    ED Course  Procedures (including critical care time)  Labs Reviewed  GLUCOSE, CAPILLARY - Abnormal; Notable for the following:    Glucose-Capillary 142 (*)     All other components within normal limits   No results found.   1. Cellulitis       MDM  Patients symptoms consistent with cellulitis. Clindamycin Rx'd with 24-48 hour follow-up. Pt diabetic so he has been advised to watch healing closely.  Pt has been advised of the symptoms that warrant their return to the ED. Patient has voiced understanding and has agreed to follow-up with the PCP or specialist.           Dorthula Matas, PA 08/11/12 1719

## 2012-08-11 NOTE — ED Notes (Signed)
States that he noticed a "pimple" with pus Thurs and has progressively gotten worse. Redness and swelling noted.

## 2012-08-12 NOTE — ED Provider Notes (Signed)
Medical screening examination/treatment/procedure(s) were performed by non-physician practitioner and as supervising physician I was immediately available for consultation/collaboration.   Laray Anger, DO 08/12/12 [phone]

## 2013-04-14 ENCOUNTER — Emergency Department (HOSPITAL_COMMUNITY)
Admission: EM | Admit: 2013-04-14 | Discharge: 2013-04-14 | Disposition: A | Discharge status: Home / Self Care | Payer: Medicaid Other | Attending: Emergency Medicine | Admitting: Emergency Medicine

## 2013-04-14 ENCOUNTER — Encounter (HOSPITAL_COMMUNITY): Payer: Self-pay

## 2013-04-14 DIAGNOSIS — E119 Type 2 diabetes mellitus without complications: Secondary | ICD-10-CM

## 2013-04-14 DIAGNOSIS — Z79899 Other long term (current) drug therapy: Secondary | ICD-10-CM | POA: Insufficient documentation

## 2013-04-14 DIAGNOSIS — K089 Disorder of teeth and supporting structures, unspecified: Secondary | ICD-10-CM | POA: Insufficient documentation

## 2013-04-14 DIAGNOSIS — R509 Fever, unspecified: Secondary | ICD-10-CM | POA: Insufficient documentation

## 2013-04-14 DIAGNOSIS — K0889 Other specified disorders of teeth and supporting structures: Secondary | ICD-10-CM

## 2013-04-14 DIAGNOSIS — F17200 Nicotine dependence, unspecified, uncomplicated: Secondary | ICD-10-CM | POA: Insufficient documentation

## 2013-04-14 DIAGNOSIS — K0381 Cracked tooth: Secondary | ICD-10-CM | POA: Insufficient documentation

## 2013-04-14 MED ORDER — AMOXICILLIN-POT CLAVULANATE 875-125 MG PO TABS: ORAL_TABLET | ORAL | Status: DC

## 2013-04-14 MED ORDER — IBUPROFEN 800 MG PO TABS
800.0000 mg | ORAL_TABLET | Freq: Once | ORAL | Status: AC
  Administered 2013-04-14: 800 mg via ORAL
  Filled 2013-04-14: qty 1

## 2013-04-14 MED ORDER — NAPROXEN 375 MG PO TABS: 375.0000 mg | ORAL_TABLET | Freq: Two times a day (BID) | ORAL | Status: DC

## 2013-04-14 NOTE — ED Notes (Signed)
Pt c/o pain to bottom RT wisdom tooth pain x 2 days.  Doesn't have a Education officer, community.

## 2013-04-14 NOTE — ED Provider Notes (Signed)
History     CSN: [id number]  Arrival date & time 04/14/13  1331   First MD Initiated Contact with Patient 04/14/13 1439      Chief Complaint  Patient presents with  . Dental Pain    (Consider location/radiation/quality/duration/timing/severity/associated sxs/prior treatment) HPI Comments: Patient is a 48 y/o M with PMHx of DM presenting to the ED with dental pain x 2 days - reported that pain is mainly to the right bottom 1st and 2nd molars described as a constant throbbing sensation with radiation to the right cheek, right temple region. Reported gargling warm water and salt with minimal relief. Patient reported pain to be worse with warm fluids. Associated symptoms are subjective fever, chills. Denied facial swelling, bleeding and drainage from gums, abscess formation, cyst formation, numbness and tingling to mouth, shortness of breathe, chest pain, difficulty breathing, gi symptoms, urinary symptoms, eye complaint, ear complaints.    The history is provided by the patient. No language interpreter was used.    Past Medical History  Diagnosis Date  . Diabetes mellitus     Past Surgical History  Procedure Laterality Date  . Wisdom tooth extraction      bottom LT, top RT    History reviewed. No pertinent family history.  History  Substance Use Topics  . Smoking status: Current Every Day Smoker -- 0.25 packs/day    Types: Cigarettes  . Smokeless tobacco: Not on file  . Alcohol Use: Yes     Comment: very occasionally      Review of Systems  Constitutional: Positive for fever and chills. Negative for fatigue.  HENT: Positive for dental problem. Negative for ear pain, sore throat, trouble swallowing and neck pain.   Eyes: Negative for pain and visual disturbance.  Respiratory: Negative for chest tightness and shortness of breath.   Cardiovascular: Negative for chest pain.  Gastrointestinal: Negative for nausea, vomiting, abdominal pain, diarrhea and constipation.    Genitourinary: Negative for dysuria, hematuria, decreased urine volume, difficulty urinating and penile pain.  Musculoskeletal: Negative for back pain.  Skin: Negative for rash.  Neurological: Negative for dizziness, light-headedness and headaches.  All other systems reviewed and are negative.    Allergies  Aspirin  Home Medications   Current Outpatient Rx  Name  Route  Sig  Dispense  Refill  . ibuprofen (ADVIL,MOTRIN) 200 MG tablet   Oral   Take 200 mg by mouth every 6 (six) hours as needed for pain.         . metFORMIN (GLUCOPHAGE-XR) 750 MG 24 hr tablet   Oral   Take 750 mg by mouth daily with breakfast.         . amoxicillin-clavulanate (AUGMENTIN) 875-125 MG per tablet      Take one tablet PO BID x 7 days.   14 tablet   0   . naproxen (NAPROSYN) 375 MG tablet   Oral   Take 1 tablet (375 mg total) by mouth 2 (two) times daily.   20 tablet   0     BP 190/100  Pulse 61  Temp(Src) 98.8 F (37.1 C) (Oral)  Resp 16  Ht 6' (1.829 m)  Wt 180 lb (81.647 kg)  BMI 24.41 kg/m2  SpO2 100%  Physical Exam  Nursing note and vitals reviewed. Constitutional: He is oriented to person, place, and time. He appears well-developed and well-nourished. No distress.  HENT:  Head: Normocephalic and atraumatic.  Mouth/Throat: Oropharynx is clear and moist. No oropharyngeal exudate.    Mouth:  No lesions, erythema, swelling, inflammation noted to face. No lesions erythema, swelling, inflammation noted to buccal mucosa bilaterally. No lesions, erythema, swelling, inflammation, abscess, cysts noted to upper and lower gums bilaterally. Negative sublingual lesions. Right lower 2nd molar cracked. Pain upon tongue depressor tapping on right lower 2nd molar.   Eyes: Conjunctivae and EOM are normal. Pupils are equal, round, and reactive to light. Right eye exhibits no discharge. Left eye exhibits no discharge.  Neck: Normal range of motion. Neck supple. No tracheal deviation present. No  thyromegaly present.  Negative lymphadenopathy  Cardiovascular: Normal rate, regular rhythm, normal heart sounds and intact distal pulses.  Exam reveals no friction rub.   No murmur heard. Pulmonary/Chest: Effort normal and breath sounds normal. No respiratory distress. He has no wheezes. He has no rales. He exhibits no tenderness.  Lymphadenopathy:    He has no cervical adenopathy.  Neurological: He is alert and oriented to person, place, and time. No cranial nerve deficit. He exhibits normal muscle tone. Coordination normal.  Skin: Skin is warm and dry. No rash noted. He is not diaphoretic. No erythema.  Psychiatric: He has a normal mood and affect. His behavior is normal. Thought content normal.    ED Course  Procedures (including critical care time)  Labs Reviewed - No data to display No results found.  Filed Vitals:   04/14/13 1537  BP: 190/100  Pulse: 61  Temp: 98.8 F (37.1 C)  Resp: 16    1. Pain, dental   2. Diabetes mellitus       MDM  Patient afebrile, normotensive, non-tachycardic, alert and oriented. Dental pain to right lower second molar - pain upon tongue depressor tapping tooth - no lesions, abscess, inflammation, sores, swelling drainage noted to site. Patient aseptic, non-toxic appearing, in no acute distress. No sublingual lesion r/o Ludwig's Angina. Discharged patient. Discharged patient with Augmentin for infection coverage and Naproxen for inflammation and pain. Discussed with patient to follow-up with dentist regarding pain and for possible tooth extraction - stressed importance to follow-up with dentist. Instructed patient to stay hydrated. Discussed with patient to apply cool compression to the face if there is swelling and to aid in pain relief. Discussed with patient to monitor symptoms and that if symptoms worsen or change to please report back to the ED. Gave patient resource list for physician.  Patient agreed to plan of care, understood, all  questions answered.      Raymon Mutton, PA-C 04/14/13 1846

## 2013-04-14 NOTE — ED Notes (Signed)
Pt given pain meds and PA aware of BP.  Pt states his BP elevated d/t pain level.

## 2013-04-16 NOTE — ED Provider Notes (Signed)
Medical screening examination/treatment/procedure(s) were performed by non-physician practitioner and as supervising physician I was immediately available for consultation/collaboration. Devoria Albe, MD, Armando Gang   Ward Givens, MD 04/16/13 [phone]

## 2013-09-28 ENCOUNTER — Encounter (HOSPITAL_COMMUNITY): Payer: Self-pay | Admitting: *Deleted

## 2013-09-28 ENCOUNTER — Emergency Department (HOSPITAL_COMMUNITY)
Admission: EM | Admit: 2013-09-28 | Discharge: 2013-09-29 | Disposition: A | Discharge status: Home / Self Care | Payer: Medicaid Other | Attending: Emergency Medicine | Admitting: Emergency Medicine

## 2013-09-28 ENCOUNTER — Emergency Department (HOSPITAL_COMMUNITY): Payer: Medicaid Other

## 2013-09-28 DIAGNOSIS — R197 Diarrhea, unspecified: Secondary | ICD-10-CM | POA: Insufficient documentation

## 2013-09-28 DIAGNOSIS — E119 Type 2 diabetes mellitus without complications: Secondary | ICD-10-CM | POA: Insufficient documentation

## 2013-09-28 DIAGNOSIS — Z79899 Other long term (current) drug therapy: Secondary | ICD-10-CM | POA: Insufficient documentation

## 2013-09-28 DIAGNOSIS — F17200 Nicotine dependence, unspecified, uncomplicated: Secondary | ICD-10-CM | POA: Insufficient documentation

## 2013-09-28 DIAGNOSIS — R112 Nausea with vomiting, unspecified: Secondary | ICD-10-CM | POA: Insufficient documentation

## 2013-09-28 LAB — CBC WITH DIFFERENTIAL/PLATELET
Basophils Absolute: 0 10*3/uL (ref 0.0–0.1)
Lymphocytes Relative: 13 % (ref 12–46)
Lymphs Abs: 1.1 10*3/uL (ref 0.7–4.0)
MCV: 90.1 fL (ref 78.0–100.0)
Neutro Abs: 6.7 10*3/uL (ref 1.7–7.7)
Neutrophils Relative %: 82 % — ABNORMAL HIGH (ref 43–77)
Platelets: 219 10*3/uL (ref 150–400)
RBC: 5.14 MIL/uL (ref 4.22–5.81)
RDW: 13 % (ref 11.5–15.5)
WBC: 8.2 10*3/uL (ref 4.0–10.5)

## 2013-09-28 LAB — COMPREHENSIVE METABOLIC PANEL
ALT: 18 U/L (ref 0–53)
AST: 20 U/L (ref 0–37)
Alkaline Phosphatase: 90 U/L (ref 39–117)
CO2: 26 mEq/L (ref 19–32)
Calcium: 9.7 mg/dL (ref 8.4–10.5)
Chloride: 95 mEq/L — ABNORMAL LOW (ref 96–112)
GFR calc Af Amer: >90 mL/min (ref >90)
GFR calc non Af Amer: >90 mL/min (ref >90)
Glucose, Bld: 179 mg/dL — ABNORMAL HIGH (ref 70–99)
Potassium: 4.2 mEq/L (ref 3.5–5.1)
Sodium: 135 mEq/L (ref 135–145)
Total Bilirubin: 0.5 mg/dL (ref 0.3–1.2)

## 2013-09-28 MED ORDER — ONDANSETRON HCL 4 MG/2ML IJ SOLN
4.0000 mg | Freq: Once | INTRAMUSCULAR | Status: AC
  Administered 2013-09-28: 4 mg via INTRAVENOUS

## 2013-09-28 MED ORDER — ONDANSETRON HCL 4 MG/2ML IJ SOLN
INTRAMUSCULAR | Status: AC
  Filled 2013-09-28: qty 2

## 2013-09-28 MED ORDER — PROMETHAZINE HCL 25 MG/ML IJ SOLN
25.0000 mg | Freq: Once | INTRAMUSCULAR | Status: AC
  Administered 2013-09-28: 25 mg via INTRAVENOUS
  Filled 2013-09-28: qty 1

## 2013-09-28 NOTE — ED Provider Notes (Signed)
CSN: [id number]     Arrival date & time 09/28/13  2205 History   First MD Initiated Contact with Patient 09/28/13 2244     Chief Complaint  Patient presents with  . Emesis  . Chest Pain   (Consider location/radiation/quality/duration/timing/severity/associated sxs/prior Treatment) HPI Comments: Patient presents with a chief complaint of nausea, vomiting, and diarrhea.  He reports that symptoms began this morning.  He reports numerous episodes of both vomiting and diarrhea.  No blood in emesis or blood in his stool.  He reports that he began having a burning substernal chest pain after several episodes of vomiting.  Burning in his chest has been present since 1 PM earlier today.  Pain radiates up into his throat.  He reports that the pain is improving at this time.  He has taken Pepto-Bismol for his symptoms, but does not feel that it is helping.  He denies urinary symptoms.  Denies abdominal pain. . Denies fever or chills.  No SOB or cough.  No known sick contacts.  He does have a history of DM.  No history of HTN or Hyperlipidemia.  No prior cardiac history.    The history is provided by the patient.    Past Medical History  Diagnosis Date  . Diabetes mellitus    Past Surgical History  Procedure Laterality Date  . Wisdom tooth extraction      bottom LT, top RT   No family history on file. History  Substance Use Topics  . Smoking status: Current Every Day Smoker -- 0.25 packs/day    Types: Cigarettes  . Smokeless tobacco: Not on file  . Alcohol Use: Yes     Comment: very occasionally    Review of Systems  All other systems reviewed and are negative.    Allergies  Aspirin  Home Medications   Current Outpatient Rx  Name  Route  Sig  Dispense  Refill  . amoxicillin-clavulanate (AUGMENTIN) 875-125 MG per tablet      Take one tablet PO BID x 7 days.   14 tablet   0   . ibuprofen (ADVIL,MOTRIN) 200 MG tablet   Oral   Take 200 mg by mouth every 6 (six) hours as needed  for pain.         . metFORMIN (GLUCOPHAGE-XR) 750 MG 24 hr tablet   Oral   Take 750 mg by mouth daily with breakfast.         . naproxen (NAPROSYN) 375 MG tablet   Oral   Take 1 tablet (375 mg total) by mouth 2 (two) times daily.   20 tablet   0    BP 185/84  Pulse 59  Temp(Src) 99.1 F (37.3 C)  Resp 20  SpO2 100% Physical Exam  Nursing note and vitals reviewed. Constitutional: He appears well-developed and well-nourished.  HENT:  Head: Normocephalic and atraumatic.  Mouth/Throat: Oropharynx is clear and moist.  Neck: Normal range of motion. Neck supple.  Cardiovascular: Normal rate, regular rhythm and normal heart sounds.   Pulmonary/Chest: Effort normal and breath sounds normal.  Abdominal: Soft. Bowel sounds are normal. He exhibits no distension and no mass. There is no tenderness. There is no rebound and no guarding.  Neurological: He is alert.  Skin: Skin is warm and dry.  Psychiatric: He has a normal mood and affect.    ED Course  Procedures (including critical care time) Labs Review Labs Reviewed  CBC WITH DIFFERENTIAL  COMPREHENSIVE METABOLIC PANEL   Imaging Review Dg Chest  2 View  09/28/2013   CLINICAL DATA:  Chest pain.  EXAM: CHEST  2 VIEW  COMPARISON:  06/06/2012.  FINDINGS: The cardiac silhouette, mediastinal and hilar contours are within normal limits and stable. Stable bullous changes at the left lung base. No acute overlying pulmonary process. No pleural effusion. The bony thorax is intact.  IMPRESSION: Stable bullous changes at the left lung base.  No acute pulmonary findings.   Electronically Signed   By: Loralie Champagne M.D.   On: 09/28/2013 23:02   12:00 AM Patient reports that nausea has improved after the Phenergan.  Will fluid challenge and reassess.    1:00 AM Patient reports that he continues to feel nauseous, but has been able to tolerate fluids.  1:30 AM Patient reports that nausea has now improved.    MDM  No diagnosis  found. Patient presenting with a chief complaint of nausea, vomiting, and diarrhea.  Patient is afebrile.  No abdominal pain on exam.  Labs unremarkable.  Patient also complaining of burning pain in his chest that presented after several episodes of vomiting.  No blood in his emesis.  Troponin negative.  CXR negative.  Pain improved after the vomiting improved.  No vomiting during ED course.  Patient able to tolerate PO liquids prior to discharge.  Patient stable for discharge.  Return precautions given.    Pascal Lux Hurst, PA-C 09/29/13 1630

## 2013-09-28 NOTE — ED Notes (Signed)
Pt vomiting since this morning; c/o chest pain since 1pm; throbbing;

## 2013-09-29 MED ORDER — PROMETHAZINE HCL 25 MG PO TABS: 25.0000 mg | ORAL_TABLET | Freq: Four times a day (QID) | ORAL | Status: DC | PRN

## 2013-09-29 MED ORDER — ONDANSETRON HCL 4 MG/2ML IJ SOLN
4.0000 mg | Freq: Once | INTRAMUSCULAR | Status: AC
  Administered 2013-09-29: 4 mg via INTRAVENOUS

## 2013-09-29 MED ORDER — ONDANSETRON HCL 4 MG/2ML IJ SOLN
INTRAMUSCULAR | Status: AC
  Filled 2013-09-29: qty 2

## 2013-09-29 NOTE — ED Provider Notes (Signed)
Medical screening examination/treatment/procedure(s) were performed by non-physician practitioner and as supervising physician I was immediately available for consultation/collaboration.  Ethelda Chick, MD 09/29/13 [phone]

## 2013-09-30 ENCOUNTER — Emergency Department (HOSPITAL_COMMUNITY): Payer: Medicaid Other

## 2013-09-30 ENCOUNTER — Emergency Department (HOSPITAL_COMMUNITY)
Admission: EM | Admit: 2013-09-30 | Discharge: 2013-09-30 | Disposition: A | Discharge status: Home / Self Care | Payer: Medicaid Other | Attending: Emergency Medicine | Admitting: Emergency Medicine

## 2013-09-30 ENCOUNTER — Encounter (HOSPITAL_COMMUNITY): Payer: Self-pay | Admitting: Emergency Medicine

## 2013-09-30 DIAGNOSIS — Z79899 Other long term (current) drug therapy: Secondary | ICD-10-CM | POA: Insufficient documentation

## 2013-09-30 DIAGNOSIS — F17200 Nicotine dependence, unspecified, uncomplicated: Secondary | ICD-10-CM | POA: Insufficient documentation

## 2013-09-30 DIAGNOSIS — R112 Nausea with vomiting, unspecified: Secondary | ICD-10-CM | POA: Insufficient documentation

## 2013-09-30 DIAGNOSIS — Z791 Long term (current) use of non-steroidal anti-inflammatories (NSAID): Secondary | ICD-10-CM | POA: Insufficient documentation

## 2013-09-30 DIAGNOSIS — E119 Type 2 diabetes mellitus without complications: Secondary | ICD-10-CM | POA: Insufficient documentation

## 2013-09-30 DIAGNOSIS — R1013 Epigastric pain: Secondary | ICD-10-CM | POA: Insufficient documentation

## 2013-09-30 LAB — CBC WITH DIFFERENTIAL/PLATELET
Basophils Absolute: 0 10*3/uL (ref 0.0–0.1)
Eosinophils Absolute: 0 10*3/uL (ref 0.0–0.7)
HCT: 49.1 % (ref 39.0–52.0)
Hemoglobin: 17.3 g/dL — ABNORMAL HIGH (ref 13.0–17.0)
Lymphocytes Relative: 18 % (ref 12–46)
Lymphs Abs: 1.2 10*3/uL (ref 0.7–4.0)
Monocytes Absolute: 0.5 10*3/uL (ref 0.1–1.0)
Neutro Abs: 4.9 10*3/uL (ref 1.7–7.7)
Platelets: 190 10*3/uL (ref 150–400)
RBC: 5.42 MIL/uL (ref 4.22–5.81)
RDW: 13 % (ref 11.5–15.5)
WBC: 6.6 10*3/uL (ref 4.0–10.5)

## 2013-09-30 LAB — HEPATIC FUNCTION PANEL
ALT: 17 U/L (ref 0–53)
AST: 15 U/L (ref 0–37)
Alkaline Phosphatase: 88 U/L (ref 39–117)
Total Protein: 8.1 g/dL (ref 6.0–8.3)

## 2013-09-30 LAB — BASIC METABOLIC PANEL
CO2: 23 mEq/L (ref 19–32)
Chloride: 96 mEq/L (ref 96–112)
GFR calc Af Amer: >90 mL/min (ref >90)
Glucose, Bld: 202 mg/dL — ABNORMAL HIGH (ref 70–99)
Sodium: 135 mEq/L (ref 135–145)

## 2013-09-30 LAB — LIPASE, BLOOD: Lipase: 20 U/L (ref 11–59)

## 2013-09-30 MED ORDER — SODIUM CHLORIDE 0.9 % IV SOLN
1000.0000 mL | Freq: Once | INTRAVENOUS | Status: AC
  Administered 2013-09-30: 1000 mL via INTRAVENOUS

## 2013-09-30 MED ORDER — OMEPRAZOLE 20 MG PO CPDR: 20.0000 mg | DELAYED_RELEASE_CAPSULE | Freq: Every day | ORAL | Status: DC

## 2013-09-30 MED ORDER — ONDANSETRON HCL 4 MG/2ML IJ SOLN
4.0000 mg | Freq: Once | INTRAMUSCULAR | Status: AC
  Administered 2013-09-30: 4 mg via INTRAVENOUS
  Filled 2013-09-30: qty 2

## 2013-09-30 MED ORDER — HYDROMORPHONE HCL PF 1 MG/ML IJ SOLN
0.5000 mg | Freq: Once | INTRAMUSCULAR | Status: AC
  Administered 2013-09-30: 0.5 mg via INTRAVENOUS
  Filled 2013-09-30: qty 1

## 2013-09-30 MED ORDER — SUCRALFATE 1 G PO TABS: 1.0000 g | ORAL_TABLET | Freq: Four times a day (QID) | ORAL | Status: DC

## 2013-09-30 MED ORDER — PROMETHAZINE HCL 25 MG/ML IJ SOLN
25.0000 mg | Freq: Once | INTRAMUSCULAR | Status: AC
  Administered 2013-09-30: 25 mg via INTRAVENOUS
  Filled 2013-09-30: qty 1

## 2013-09-30 NOTE — ED Provider Notes (Addendum)
CSN: [id number]     Arrival date & time 09/30/13  1651 History   First MD Initiated Contact with Patient 09/30/13 1846     Chief Complaint  Patient presents with  . Nausea  . Emesis  . Diarrhea   (Consider location/radiation/quality/duration/timing/severity/associated sxs/prior Treatment) HPI Patient presents for the second time in 48 hours with nausea, vomiting, epigastric pain. Patient states that since his initial evaluation, he was briefly well, but soon developed additional nausea, epigastric pain, vomiting.  When the patient is not vomiting, there is minimal pain, but the epigastric pain is severe, nonradiating when he is vomiting. No relief with anything. The patient does state that he tried to eat something in the interim, and has not taken any new medication. No new fever, no chills, no other chest pain, dyspnea.  Past Medical History  Diagnosis Date  . Diabetes mellitus    Past Surgical History  Procedure Laterality Date  . Wisdom tooth extraction      bottom LT, top RT   No family history on file. History  Substance Use Topics  . Smoking status: Current Every Day Smoker -- 0.25 packs/day    Types: Cigarettes  . Smokeless tobacco: Not on file  . Alcohol Use: Yes     Comment: very occasionally    Review of Systems  Constitutional:       Per HPI, otherwise negative  HENT:       Per HPI, otherwise negative  Respiratory:       Per HPI, otherwise negative  Cardiovascular:       Per HPI, otherwise negative  Gastrointestinal: Positive for nausea, vomiting and abdominal pain. Negative for diarrhea.  Endocrine:       Negative aside from HPI  Genitourinary:       Neg aside from HPI   Musculoskeletal:       Per HPI, otherwise negative  Skin: Negative.   Neurological: Negative for syncope.    Allergies  Aspirin  Home Medications   Current Outpatient Rx  Name  Route  Sig  Dispense  Refill  . ibuprofen (ADVIL,MOTRIN) 200 MG tablet   Oral   Take 200 mg by  mouth every 6 (six) hours as needed for pain.         . metFORMIN (GLUCOPHAGE-XR) 750 MG 24 hr tablet   Oral   Take 750 mg by mouth daily with breakfast.         . naproxen (NAPROSYN) 375 MG tablet   Oral   Take 1 tablet (375 mg total) by mouth 2 (two) times daily.   20 tablet   0   . promethazine (PHENERGAN) 25 MG tablet   Oral   Take 1 tablet (25 mg total) by mouth every 6 (six) hours as needed for nausea.   20 tablet   0    BP 215/93  Pulse 63  Temp(Src) 98.9 F (37.2 C) (Oral)  Resp 16  SpO2 100% Physical Exam  Nursing note and vitals reviewed. Constitutional: He is oriented to person, place, and time. He appears well-developed. No distress.  HENT:  Head: Normocephalic and atraumatic.  Eyes: Conjunctivae and EOM are normal.  Cardiovascular: Normal rate and regular rhythm.   Pulmonary/Chest: Effort normal. No stridor. No respiratory distress.  Abdominal: He exhibits no distension. There is no tenderness. There is no rebound and no guarding.  Musculoskeletal: He exhibits no edema.  Neurological: He is alert and oriented to person, place, and time.  Skin: Skin is warm  and dry.  Psychiatric: He has a normal mood and affect.    ED Course  Procedures (including critical care time) Labs Review Labs Reviewed  CBC WITH DIFFERENTIAL - Abnormal; Notable for the following:    Hemoglobin 17.3 (*)    All other components within normal limits  BASIC METABOLIC PANEL  LIPASE, BLOOD  HEPATIC FUNCTION PANEL   Imaging Review Dg Chest 2 View  09/28/2013   CLINICAL DATA:  Chest pain.  EXAM: CHEST  2 VIEW  COMPARISON:  06/06/2012.  FINDINGS: The cardiac silhouette, mediastinal and hilar contours are within normal limits and stable. Stable bullous changes at the left lung base. No acute overlying pulmonary process. No pleural effusion. The bony thorax is intact.  IMPRESSION: Stable bullous changes at the left lung base.  No acute pulmonary findings.   Electronically Signed   By:  Loralie Champagne M.D.   On: 09/28/2013 23:02  patient's initial vital signs notable for systolic blood pressure greater than 200.  I reviewed the patient's chart, including ED evaluation 2 days ago.  EKG has a sinus rhythm, short PR, rate 64, otherwise unremarkable  9:37 PM Patient sleeping. I discussed all results with the patient and his wife. MDM  No diagnosis found. Patient presents with ongoing nausea, vomiting, epigastric pain.  Patient was seen here for similar complaints within the past 48 hours.  With patient that currently taking any antacid, Carafate, or other medication focused on gastroesophageal irritation.  With 2 negative troponins, and substantial improvement following medication here, there is low suspicion for ACS, occult infection.  Patient's presentations are likely due to gastroesophageal irritation.  Hemoglobin stable, there is low concern for ulceration.  I had a lengthy conversation with the patient and his wife.  Patient will be discharged with new medication,    gastroenterology followup.  Gerhard Munch, MD 09/30/13 2139  Gerhard Munch, MD 09/30/13 2140

## 2013-09-30 NOTE — ED Notes (Signed)
Called pt for a BP recheck.  Pt did not answer.

## 2013-09-30 NOTE — ED Notes (Signed)
Pt states he was here on Monday and d/c.  C/o NVD.

## 2014-03-09 ENCOUNTER — Emergency Department (HOSPITAL_COMMUNITY)
Admission: EM | Admit: 2014-03-09 | Discharge: 2014-03-10 | Disposition: A | Discharge status: Home / Self Care | Payer: Medicaid Other | Attending: Emergency Medicine | Admitting: Emergency Medicine

## 2014-03-09 ENCOUNTER — Encounter (HOSPITAL_COMMUNITY): Payer: Self-pay | Admitting: Emergency Medicine

## 2014-03-09 DIAGNOSIS — I10 Essential (primary) hypertension: Secondary | ICD-10-CM | POA: Insufficient documentation

## 2014-03-09 DIAGNOSIS — Z791 Long term (current) use of non-steroidal anti-inflammatories (NSAID): Secondary | ICD-10-CM | POA: Insufficient documentation

## 2014-03-09 DIAGNOSIS — Z79899 Other long term (current) drug therapy: Secondary | ICD-10-CM | POA: Insufficient documentation

## 2014-03-09 DIAGNOSIS — F17200 Nicotine dependence, unspecified, uncomplicated: Secondary | ICD-10-CM | POA: Insufficient documentation

## 2014-03-09 DIAGNOSIS — E119 Type 2 diabetes mellitus without complications: Secondary | ICD-10-CM | POA: Insufficient documentation

## 2014-03-09 DIAGNOSIS — R197 Diarrhea, unspecified: Secondary | ICD-10-CM | POA: Insufficient documentation

## 2014-03-09 DIAGNOSIS — R112 Nausea with vomiting, unspecified: Secondary | ICD-10-CM

## 2014-03-09 HISTORY — DX: Essential (primary) hypertension: I10

## 2014-03-09 LAB — CBC WITH DIFFERENTIAL/PLATELET
BASOS PCT: 0 % (ref 0–1)
Basophils Absolute: 0 10*3/uL (ref 0.0–0.1)
EOS ABS: 0 10*3/uL (ref 0.0–0.7)
Eosinophils Relative: 0 % (ref 0–5)
HCT: 45.2 % (ref 39.0–52.0)
Hemoglobin: 16 g/dL (ref 13.0–17.0)
Lymphocytes Relative: 10 % — ABNORMAL LOW (ref 12–46)
Lymphs Abs: 1 10*3/uL (ref 0.7–4.0)
MCH: 32.2 pg (ref 26.0–34.0)
MCHC: 35.4 g/dL (ref 30.0–36.0)
MCV: 90.9 fL (ref 78.0–100.0)
Monocytes Absolute: 0.4 10*3/uL (ref 0.1–1.0)
Monocytes Relative: 3 % (ref 3–12)
NEUTROS PCT: 87 % — AB (ref 43–77)
Neutro Abs: 9.2 10*3/uL — ABNORMAL HIGH (ref 1.7–7.7)
PLATELETS: 214 10*3/uL (ref 150–400)
RBC: 4.97 MIL/uL (ref 4.22–5.81)
RDW: 13.5 % (ref 11.5–15.5)
WBC: 10.6 10*3/uL — ABNORMAL HIGH (ref 4.0–10.5)

## 2014-03-09 LAB — COMPREHENSIVE METABOLIC PANEL
ALT: 17 U/L (ref 0–53)
AST: 15 U/L (ref 0–37)
Albumin: 4.5 g/dL (ref 3.5–5.2)
Alkaline Phosphatase: 95 U/L (ref 39–117)
BUN: 11 mg/dL (ref 6–23)
CALCIUM: 9.9 mg/dL (ref 8.4–10.5)
CO2: 24 mEq/L (ref 19–32)
Chloride: 99 mEq/L (ref 96–112)
Creatinine, Ser: 1.01 mg/dL (ref 0.50–1.35)
GFR calc Af Amer: >90 mL/min (ref >90)
GFR calc non Af Amer: 86 mL/min — ABNORMAL LOW (ref >90)
Glucose, Bld: 201 mg/dL — ABNORMAL HIGH (ref 70–99)
POTASSIUM: 4 meq/L (ref 3.7–5.3)
SODIUM: 141 meq/L (ref 137–147)
TOTAL PROTEIN: 8.1 g/dL (ref 6.0–8.3)
Total Bilirubin: 0.4 mg/dL (ref 0.3–1.2)

## 2014-03-09 LAB — I-STAT TROPONIN, ED: Troponin i, poc: 0.02 ng/mL (ref 0.00–0.08)

## 2014-03-09 LAB — LIPASE, BLOOD: LIPASE: 14 U/L (ref 11–59)

## 2014-03-09 LAB — CBG MONITORING, ED: GLUCOSE-CAPILLARY: 213 mg/dL — AB (ref 70–99)

## 2014-03-09 MED ORDER — SODIUM CHLORIDE 0.9 % IV BOLUS (SEPSIS)
1000.0000 mL | Freq: Once | INTRAVENOUS | Status: AC
  Administered 2014-03-09: 1000 mL via INTRAVENOUS

## 2014-03-09 MED ORDER — ONDANSETRON HCL 4 MG/2ML IJ SOLN
4.0000 mg | Freq: Once | INTRAMUSCULAR | Status: AC
  Administered 2014-03-09: 4 mg via INTRAVENOUS
  Filled 2014-03-09: qty 2

## 2014-03-09 MED ORDER — FENTANYL CITRATE 0.05 MG/ML IJ SOLN
50.0000 ug | INTRAMUSCULAR | Status: DC | PRN
  Administered 2014-03-10 (×2): 50 ug via INTRAVENOUS
  Filled 2014-03-09 (×2): qty 2

## 2014-03-09 MED ORDER — PROMETHAZINE HCL 25 MG/ML IJ SOLN
25.0000 mg | Freq: Once | INTRAMUSCULAR | Status: AC
  Administered 2014-03-10: 25 mg via INTRAVENOUS
  Filled 2014-03-09: qty 1

## 2014-03-09 NOTE — ED Notes (Signed)
Pt states that he started vomiting and hyperglycemia at home. Hypertensive. States BS at home was 211.

## 2014-03-09 NOTE — ED Notes (Signed)
MD at bedside. 

## 2014-03-09 NOTE — ED Notes (Signed)
Pt unable to void at this time. 

## 2014-03-10 LAB — URINALYSIS, ROUTINE W REFLEX MICROSCOPIC
Bilirubin Urine: NEGATIVE
Glucose, UA: NEGATIVE mg/dL
LEUKOCYTES UA: NEGATIVE
NITRITE: NEGATIVE
PROTEIN: 100 mg/dL — AB
Specific Gravity, Urine: 1.027 (ref 1.005–1.030)
Urobilinogen, UA: 0.2 mg/dL (ref 0.0–1.0)
pH: 6 (ref 5.0–8.0)

## 2014-03-10 LAB — URINE MICROSCOPIC-ADD ON

## 2014-03-10 MED ORDER — PROMETHAZINE HCL 25 MG PO TABS: 25.0000 mg | ORAL_TABLET | Freq: Four times a day (QID) | ORAL | Status: DC | PRN

## 2014-03-10 NOTE — ED Provider Notes (Signed)
CSN: [id number]     Arrival date & time 03/09/14  2245 History   First MD Initiated Contact with Patient 03/09/14 2342     Chief Complaint  Patient presents with  . Emesis  . Hyperglycemia     (Consider location/radiation/quality/duration/timing/severity/associated sxs/prior Treatment) HPI History per patient. Nausea vomiting and diarrhea onset around 10 AM this morning. Denies any abdominal pain /back pain but does c/o queezieness in his stomach. No fevers. Chills today. Patient is diabetic and blood sugar is over 200 tonight. No dysuria. No rash. No recent travel. No known sick contacts. Unable to hold anything down tonight. Symptoms moderate in severity.  Past Medical History  Diagnosis Date  . Diabetes mellitus   . Hypertension    Past Surgical History  Procedure Laterality Date  . Wisdom tooth extraction      bottom LT, top RT   History reviewed. No pertinent family history. History  Substance Use Topics  . Smoking status: Current Every Day Smoker -- 0.25 packs/day    Types: Cigarettes  . Smokeless tobacco: Not on file  . Alcohol Use: Yes     Comment: very occasionally    Review of Systems  Constitutional: Negative for fever and chills.  Respiratory: Negative for shortness of breath.   Cardiovascular: Negative for chest pain.  Gastrointestinal: Positive for nausea, vomiting and diarrhea. Negative for abdominal pain and blood in stool.  Genitourinary: Negative for dysuria.  Musculoskeletal: Negative for back pain.  Skin: Negative for rash.  Neurological: Negative for syncope and headaches.  All other systems reviewed and are negative.      Allergies  Aspirin  Home Medications   Current Outpatient Rx  Name  Route  Sig  Dispense  Refill  . metFORMIN (GLUCOPHAGE) 500 MG tablet   Oral   Take 500 mg by mouth 2 (two) times daily with a meal.         . ibuprofen (ADVIL,MOTRIN) 200 MG tablet   Oral   Take 200 mg by mouth every 6 (six) hours as needed for  pain.          BP 109/69  Pulse 92  Temp(Src) 98.5 F (36.9 C) (Oral)  Resp 14  SpO2 97% Physical Exam  Nursing note and vitals reviewed. Constitutional: He is oriented to person, place, and time. He appears well-developed and well-nourished.  HENT:  Head: Normocephalic and atraumatic.  Eyes: EOM are normal. Pupils are equal, round, and reactive to light.  Neck: Neck supple.  Cardiovascular: Normal rate, regular rhythm and intact distal pulses.   Pulmonary/Chest: Effort normal and breath sounds normal. No respiratory distress.  Abdominal: Soft. He exhibits no distension. There is no tenderness. There is no rebound and no guarding.  Hyperactive bowel sounds  Musculoskeletal: Normal range of motion. He exhibits no edema and no tenderness.  Neurological: He is alert and oriented to person, place, and time. No cranial nerve deficit.  Skin: Skin is warm and dry.    ED Course  Procedures (including critical care time) Labs Review Labs Reviewed  CBC WITH DIFFERENTIAL - Abnormal; Notable for the following:    WBC 10.6 (*)    Neutrophils Relative % 87 (*)    Neutro Abs 9.2 (*)    Lymphocytes Relative 10 (*)    All other components within normal limits  COMPREHENSIVE METABOLIC PANEL - Abnormal; Notable for the following:    Glucose, Bld 201 (*)    GFR calc non Af Amer 86 (*)    All  other components within normal limits  URINALYSIS, ROUTINE W REFLEX MICROSCOPIC - Abnormal; Notable for the following:    Hgb urine dipstick SMALL (*)    Ketones, ur >80 (*)    Protein, ur 100 (*)    All other components within normal limits  CBG MONITORING, ED - Abnormal; Notable for the following:    Glucose-Capillary 213 (*)    All other components within normal limits  LIPASE, BLOOD  URINE MICROSCOPIC-ADD ON  I-STAT TROPOININ, ED  CBG MONITORING, ED   Imaging Review No results found.   EKG Interpretation   Date/Time:  Tuesday March 09 2014 23:00:12 EDT Ventricular Rate:  60 PR  Interval:  140 QRS Duration: 98 QT Interval:  449 QTC Calculation: 449 R Axis:   81 Text Interpretation:  Sinus rhythm Nonspecific ST abnormality No  significant change since last tracing Confirmed by Donyell Carrell  MD, Mickelle Goupil  [phone]([id number]) on 03/09/2014 11:43:17 PM     IV fluids. IV Zofran. On recheck is still nauseated requesting something stronger IV Phenergan and fentanyl provided. - Recheck feels improved, requesting to rest in the ER for a while. No vomiting. Tolerating by mouth fluids.  Plan discharge home with prescription for Phenergan.  Patient will take Imodium as needed. He agrees to close outpatient followup and strict return precautions. MDM   Diagnosis: Nausea vomiting diarrhea  Treated with IV fluids and medications as above Labs and urinalysis reviewed. EKG reviewed. Symptoms do not suggest ACS Vital signs and nursing notes reviewed and considered.    Sunnie NielsenBrian Grayson Pfefferle, MD 03/10/14 [phone]

## 2014-03-10 NOTE — Discharge Instructions (Signed)
Viral Gastroenteritis Viral gastroenteritis is also known as stomach flu. This condition affects the stomach and intestinal tract. It can cause sudden diarrhea and vomiting. The illness typically lasts 3 to 8 days. Most people develop an immune response that eventually gets rid of the virus. While this natural response develops, the virus can make you quite ill. CAUSES  Many different viruses can cause gastroenteritis, such as rotavirus or noroviruses. You can catch one of these viruses by consuming contaminated food or water. You may also catch a virus by sharing utensils or other personal items with an infected person or by touching a contaminated surface. SYMPTOMS  The most common symptoms are diarrhea and vomiting. These problems can cause a severe loss of body fluids (dehydration) and a body salt (electrolyte) imbalance. Other symptoms may include:  Fever.  Headache.  Fatigue.  Abdominal pain. DIAGNOSIS  Your caregiver can usually diagnose viral gastroenteritis based on your symptoms and a physical exam. A stool sample may also be taken to test for the presence of viruses or other infections. TREATMENT  This illness typically goes away on its own. Treatments are aimed at rehydration. The most serious cases of viral gastroenteritis involve vomiting so severely that you are not able to keep fluids down. In these cases, fluids must be given through an intravenous line (IV). HOME CARE INSTRUCTIONS   Drink enough fluids to keep your urine clear or pale yellow. Drink small amounts of fluids frequently and increase the amounts as tolerated.  Ask your caregiver for specific rehydration instructions.  Avoid:  Foods high in sugar.  Alcohol.  Carbonated drinks.  Tobacco.  Juice.  Caffeine drinks.  Extremely hot or cold fluids.  Fatty, greasy foods.  Too much intake of anything at one time.  Dairy products until 24 to 48 hours after diarrhea stops.  You may consume probiotics.  Probiotics are active cultures of beneficial bacteria. They may lessen the amount and number of diarrheal stools in adults. Probiotics can be found in yogurt with active cultures and in supplements.  Wash your hands well to avoid spreading the virus.  Only take over-the-counter or prescription medicines for pain, discomfort, or fever as directed by your caregiver. Do not give aspirin to children. Antidiarrheal medicines are not recommended.  Ask your caregiver if you should continue to take your regular prescribed and over-the-counter medicines.  Keep all follow-up appointments as directed by your caregiver. SEEK IMMEDIATE MEDICAL CARE IF:   You are unable to keep fluids down.  You do not urinate at least once every 6 to 8 hours.  You develop shortness of breath.  You notice blood in your stool or vomit. This may look like coffee grounds.  You have abdominal pain that increases or is concentrated in one small area (localized).  You have persistent vomiting or diarrhea.  You have a fever.  The patient is a child younger than 3 months, and he or she has a fever.  The patient is a child older than 3 months, and he or she has a fever and persistent symptoms.  The patient is a child older than 3 months, and he or she has a fever and symptoms suddenly get worse.  The patient is a baby, and he or she has no tears when crying. MAKE SURE YOU:   Understand these instructions.  Will watch your condition.  Will get help right away if you are not doing well or get worse. Document Released: 12/17/2005 Document Revised: 03/10/2012 Document Reviewed: 10/03/2011   [employer] Patient Information 2014 [employer].  

## 2014-05-10 ENCOUNTER — Encounter (HOSPITAL_COMMUNITY): Payer: Self-pay | Admitting: Emergency Medicine

## 2014-05-10 ENCOUNTER — Emergency Department (HOSPITAL_COMMUNITY)
Admission: EM | Admit: 2014-05-10 | Discharge: 2014-05-11 | Disposition: A | Discharge status: Home / Self Care | Payer: Medicaid Other | Attending: Emergency Medicine | Admitting: Emergency Medicine

## 2014-05-10 DIAGNOSIS — Z79899 Other long term (current) drug therapy: Secondary | ICD-10-CM | POA: Insufficient documentation

## 2014-05-10 DIAGNOSIS — E119 Type 2 diabetes mellitus without complications: Secondary | ICD-10-CM | POA: Insufficient documentation

## 2014-05-10 DIAGNOSIS — K5289 Other specified noninfective gastroenteritis and colitis: Secondary | ICD-10-CM | POA: Insufficient documentation

## 2014-05-10 DIAGNOSIS — K529 Noninfective gastroenteritis and colitis, unspecified: Secondary | ICD-10-CM

## 2014-05-10 DIAGNOSIS — F17200 Nicotine dependence, unspecified, uncomplicated: Secondary | ICD-10-CM | POA: Insufficient documentation

## 2014-05-10 DIAGNOSIS — I10 Essential (primary) hypertension: Secondary | ICD-10-CM | POA: Insufficient documentation

## 2014-05-10 LAB — CBC WITH DIFFERENTIAL/PLATELET
BASOS ABS: 0 10*3/uL (ref 0.0–0.1)
Basophils Relative: 0 % (ref 0–1)
Eosinophils Absolute: 0.1 10*3/uL (ref 0.0–0.7)
Eosinophils Relative: 1 % (ref 0–5)
HEMATOCRIT: 43.4 % (ref 39.0–52.0)
Hemoglobin: 15.4 g/dL (ref 13.0–17.0)
LYMPHS PCT: 23 % (ref 12–46)
Lymphs Abs: 1.7 10*3/uL (ref 0.7–4.0)
MCH: 32.2 pg (ref 26.0–34.0)
MCHC: 35.5 g/dL (ref 30.0–36.0)
MCV: 90.8 fL (ref 78.0–100.0)
MONO ABS: 0.6 10*3/uL (ref 0.1–1.0)
Monocytes Relative: 8 % (ref 3–12)
NEUTROS ABS: 5.1 10*3/uL (ref 1.7–7.7)
Neutrophils Relative %: 68 % (ref 43–77)
Platelets: 206 10*3/uL (ref 150–400)
RBC: 4.78 MIL/uL (ref 4.22–5.81)
RDW: 13.1 % (ref 11.5–15.5)
WBC: 7.5 10*3/uL (ref 4.0–10.5)

## 2014-05-10 LAB — BASIC METABOLIC PANEL
BUN: 11 mg/dL (ref 6–23)
CHLORIDE: 107 meq/L (ref 96–112)
CO2: 22 meq/L (ref 19–32)
CREATININE: 0.93 mg/dL (ref 0.50–1.35)
Calcium: 8.2 mg/dL — ABNORMAL LOW (ref 8.4–10.5)
GFR calc Af Amer: >90 mL/min (ref >90)
GFR calc non Af Amer: >90 mL/min (ref >90)
Glucose, Bld: 143 mg/dL — ABNORMAL HIGH (ref 70–99)
Potassium: 4 mEq/L (ref 3.7–5.3)
Sodium: 142 mEq/L (ref 137–147)

## 2014-05-10 LAB — COMPREHENSIVE METABOLIC PANEL
ALT: 17 U/L (ref 0–53)
AST: 16 U/L (ref 0–37)
Albumin: 4.2 g/dL (ref 3.5–5.2)
Alkaline Phosphatase: 87 U/L (ref 39–117)
BUN: 13 mg/dL (ref 6–23)
CO2: 19 meq/L (ref 19–32)
CREATININE: 0.99 mg/dL (ref 0.50–1.35)
Calcium: 9.2 mg/dL (ref 8.4–10.5)
Chloride: 103 mEq/L (ref 96–112)
GFR calc Af Amer: >90 mL/min (ref >90)
Glucose, Bld: 215 mg/dL — ABNORMAL HIGH (ref 70–99)
Potassium: 3.6 mEq/L — ABNORMAL LOW (ref 3.7–5.3)
SODIUM: 139 meq/L (ref 137–147)
Total Bilirubin: 0.4 mg/dL (ref 0.3–1.2)
Total Protein: 7.5 g/dL (ref 6.0–8.3)

## 2014-05-10 LAB — CBG MONITORING, ED: GLUCOSE-CAPILLARY: 183 mg/dL — AB (ref 70–99)

## 2014-05-10 LAB — I-STAT TROPONIN, ED: Troponin i, poc: 0 ng/mL (ref 0.00–0.08)

## 2014-05-10 LAB — LIPASE, BLOOD: Lipase: 18 U/L (ref 11–59)

## 2014-05-10 MED ORDER — ONDANSETRON HCL 4 MG PO TABS: 4.0000 mg | ORAL_TABLET | Freq: Four times a day (QID) | ORAL | Status: DC

## 2014-05-10 MED ORDER — ONDANSETRON 8 MG PO TBDP
8.0000 mg | ORAL_TABLET | Freq: Once | ORAL | Status: AC
  Administered 2014-05-10: 8 mg via ORAL
  Filled 2014-05-10: qty 1

## 2014-05-10 MED ORDER — SODIUM CHLORIDE 0.9 % IV BOLUS (SEPSIS)
1000.0000 mL | Freq: Once | INTRAVENOUS | Status: AC
  Administered 2014-05-10: 1000 mL via INTRAVENOUS

## 2014-05-10 MED ORDER — DICYCLOMINE HCL 10 MG/ML IM SOLN
20.0000 mg | Freq: Once | INTRAMUSCULAR | Status: AC
  Administered 2014-05-10: 20 mg via INTRAMUSCULAR
  Filled 2014-05-10: qty 2

## 2014-05-10 MED ORDER — HYDROMORPHONE HCL PF 1 MG/ML IJ SOLN
1.0000 mg | Freq: Once | INTRAMUSCULAR | Status: AC
  Administered 2014-05-10: 1 mg via INTRAVENOUS
  Filled 2014-05-10: qty 1

## 2014-05-10 MED ORDER — METOCLOPRAMIDE HCL 5 MG/ML IJ SOLN
10.0000 mg | Freq: Once | INTRAMUSCULAR | Status: AC
  Administered 2014-05-10: 10 mg via INTRAVENOUS
  Filled 2014-05-10: qty 2

## 2014-05-10 MED ORDER — DICYCLOMINE HCL 20 MG PO TABS: 20.0000 mg | ORAL_TABLET | Freq: Two times a day (BID) | ORAL | Status: DC

## 2014-05-10 MED ORDER — PROMETHAZINE HCL 25 MG/ML IJ SOLN
12.5000 mg | Freq: Once | INTRAMUSCULAR | Status: AC
  Administered 2014-05-10: 12.5 mg via INTRAVENOUS
  Filled 2014-05-10: qty 1

## 2014-05-10 NOTE — ED Provider Notes (Signed)
CSN: [id number]     Arrival date & time 05/10/14  1632 History   First MD Initiated Contact with Patient 05/10/14 1752     Chief Complaint  Patient presents with  . Abdominal Pain  . Emesis  . Diarrhea     (Consider location/radiation/quality/duration/timing/severity/associated sxs/prior Treatment) HPI Comments: Patient is 49 year old DM male who presents to the ED with a day history of crampy abdominal pain, nausea and multiple episodes of NBNB vomitus.  He states he has no previous history of DKA, states is unable to keep down even sips of liquids, reports multiple episodes of watery diarrhea.  No one else in the home is ill and he denies eating anything that might have made him ill.  He denies fever, chills, localization of the pain.  Patient is a 49 y.o. male presenting with abdominal pain, vomiting, and diarrhea. The history is provided by the patient. No language interpreter was used.  Abdominal Pain Pain location:  Generalized Pain quality: cramping   Pain radiates to:  Does not radiate Pain severity:  Severe Onset quality:  Sudden Duration:  8 hours Timing:  Constant Progression:  Worsening Chronicity:  New Context: not alcohol use, not diet changes, not previous surgeries, not recent travel, not retching, not sick contacts and not suspicious food intake   Relieved by:  Nothing Worsened by:  Nothing tried Ineffective treatments:  None tried Associated symptoms: anorexia, diarrhea, nausea and vomiting   Associated symptoms: no chest pain, no chills, no constipation, no dysuria, no fatigue, no hematemesis, no hematochezia, no hematuria and no melena   Emesis Associated symptoms: abdominal pain and diarrhea   Associated symptoms: no chills   Diarrhea Associated symptoms: abdominal pain and vomiting   Associated symptoms: no chills     Past Medical History  Diagnosis Date  . Diabetes mellitus   . Hypertension    Past Surgical History  Procedure Laterality Date  .  Wisdom tooth extraction      bottom LT, top RT   No family history on file. History  Substance Use Topics  . Smoking status: Current Every Day Smoker -- 0.25 packs/day    Types: Cigarettes  . Smokeless tobacco: Not on file  . Alcohol Use: Yes     Comment: very occasionally    Review of Systems  Constitutional: Negative for chills and fatigue.  Cardiovascular: Negative for chest pain.  Gastrointestinal: Positive for nausea, vomiting, abdominal pain, diarrhea and anorexia. Negative for constipation, melena, hematochezia and hematemesis.  Genitourinary: Negative for dysuria and hematuria.  All other systems reviewed and are negative.     Allergies  Aspirin  Home Medications   Prior to Admission medications   Medication Sig Start Date End Date Taking? Authorizing Provider  metFORMIN (GLUCOPHAGE) 500 MG tablet Take 500 mg by mouth 2 (two) times daily with a meal.   Yes Historical Provider, MD   BP 136/84  Pulse 76  Temp(Src) 98.4 F (36.9 C) (Oral)  Resp 16  SpO2 98% Physical Exam  Nursing note and vitals reviewed. Constitutional: He is oriented to person, place, and time. He appears well-developed and well-nourished. No distress.  HENT:  Head: Normocephalic and atraumatic.  Right Ear: External ear normal.  Left Ear: External ear normal.  Nose: Nose normal.  Mouth/Throat: Oropharynx is clear and moist. No oropharyngeal exudate.  Eyes: Conjunctivae are normal. Pupils are equal, round, and reactive to light. No scleral icterus.  Neck: Normal range of motion. Neck supple.  Cardiovascular: Normal rate,  regular rhythm and normal heart sounds.  Exam reveals no gallop.   No murmur heard. Pulmonary/Chest: Effort normal and breath sounds normal. No respiratory distress. He has no wheezes. He has no rales. He exhibits no tenderness.  Abdominal: Soft. Bowel sounds are normal. He exhibits no distension. There is tenderness. There is no rebound and no guarding.  Diffuse abdominal  tenderness without guarding or rebound  Musculoskeletal: Normal range of motion. He exhibits no edema and no tenderness.  Lymphadenopathy:    He has no cervical adenopathy.  Neurological: He is alert and oriented to person, place, and time. He exhibits normal muscle tone. Coordination normal.  Skin: Skin is warm and dry. No rash noted. No erythema. No pallor.  Psychiatric: He has a normal mood and affect. His behavior is normal. Judgment and thought content normal.    ED Course  Procedures (including critical care time) Labs Review Labs Reviewed  COMPREHENSIVE METABOLIC PANEL - Abnormal; Notable for the following:    Potassium 3.6 (*)    Glucose, Bld 215 (*)    All other components within normal limits  BASIC METABOLIC PANEL - Abnormal; Notable for the following:    Glucose, Bld 143 (*)    Calcium 8.2 (*)    All other components within normal limits  CBG MONITORING, ED - Abnormal; Notable for the following:    Glucose-Capillary 183 (*)    All other components within normal limits  CBC WITH DIFFERENTIAL  LIPASE, BLOOD  URINALYSIS, ROUTINE W REFLEX MICROSCOPIC  I-STAT TROPOININ, ED    Imaging Review No results found.   EKG Interpretation None      Results for orders placed during the hospital encounter of 05/10/14  CBC WITH DIFFERENTIAL      Result Value Ref Range   WBC 7.5  4.0 - 10.5 K/uL   RBC 4.78  4.22 - 5.81 MIL/uL   Hemoglobin 15.4  13.0 - 17.0 g/dL   HCT 16.1  09.6 - 04.5 %   MCV 90.8  78.0 - 100.0 fL   MCH 32.2  26.0 - 34.0 pg   MCHC 35.5  30.0 - 36.0 g/dL   RDW 40.9  81.1 - 91.4 %   Platelets 206  150 - 400 K/uL   Neutrophils Relative % 68  43 - 77 %   Neutro Abs 5.1  1.7 - 7.7 K/uL   Lymphocytes Relative 23  12 - 46 %   Lymphs Abs 1.7  0.7 - 4.0 K/uL   Monocytes Relative 8  3 - 12 %   Monocytes Absolute 0.6  0.1 - 1.0 K/uL   Eosinophils Relative 1  0 - 5 %   Eosinophils Absolute 0.1  0.0 - 0.7 K/uL   Basophils Relative 0  0 - 1 %   Basophils Absolute  0.0  0.0 - 0.1 K/uL  COMPREHENSIVE METABOLIC PANEL      Result Value Ref Range   Sodium 139  137 - 147 mEq/L   Potassium 3.6 (*) 3.7 - 5.3 mEq/L   Chloride 103  96 - 112 mEq/L   CO2 19  19 - 32 mEq/L   Glucose, Bld 215 (*) 70 - 99 mg/dL   BUN 13  6 - 23 mg/dL   Creatinine, Ser 7.82  0.50 - 1.35 mg/dL   Calcium 9.2  8.4 - 95.6 mg/dL   Total Protein 7.5  6.0 - 8.3 g/dL   Albumin 4.2  3.5 - 5.2 g/dL   AST 16  0 -  37 U/L   ALT 17  0 - 53 U/L   Alkaline Phosphatase 87  39 - 117 U/L   Total Bilirubin 0.4  0.3 - 1.2 mg/dL   GFR calc non Af Amer >90  >90 mL/min   GFR calc Af Amer >90  >90 mL/min  LIPASE, BLOOD      Result Value Ref Range   Lipase 18  11 - 59 U/L  BASIC METABOLIC PANEL      Result Value Ref Range   Sodium 142  137 - 147 mEq/L   Potassium 4.0  3.7 - 5.3 mEq/L   Chloride 107  96 - 112 mEq/L   CO2 22  19 - 32 mEq/L   Glucose, Bld 143 (*) 70 - 99 mg/dL   BUN 11  6 - 23 mg/dL   Creatinine, Ser 1.65  0.50 - 1.35 mg/dL   Calcium 8.2 (*) 8.4 - 10.5 mg/dL   GFR calc non Af Amer >90  >90 mL/min   GFR calc Af Amer >90  >90 mL/min  CBG MONITORING, ED      Result Value Ref Range   Glucose-Capillary 183 (*) 70 - 99 mg/dL  I-STAT TROPOININ, ED      Result Value Ref Range   Troponin i, poc 0.00  0.00 - 0.08 ng/mL   Comment 3            No results found.  Medications  ondansetron (ZOFRAN-ODT) disintegrating tablet 8 mg (8 mg Oral Given 05/10/14 1708)  metoCLOPramide (REGLAN) injection 10 mg (10 mg Intravenous Given 05/10/14 1836)  dicyclomine (BENTYL) injection 20 mg (20 mg Intramuscular Given 05/10/14 1834)  sodium chloride 0.9 % bolus 1,000 mL (0 mLs Intravenous Stopped 05/10/14 2121)  HYDROmorphone (DILAUDID) injection 1 mg (1 mg Intravenous Given 05/10/14 2037)  promethazine (PHENERGAN) injection 12.5 mg (12.5 mg Intravenous Given 05/10/14 2038)     MDM   Gastroenteritis  Patient here with N/V/D and diffuse crampy abdominal pain.  Initially the patient had an anion gap  of 17, but after a liter of fluids and pain medication, the gap has normalized to 13.  Patient feels better and is able to tolerate po fluids.  Will discharge with bentyl and zofran.    Izola Price Marisue Humble, PA-C 05/10/14 2352

## 2014-05-10 NOTE — ED Notes (Signed)
Pt still unable to void

## 2014-05-10 NOTE — Discharge Instructions (Signed)
Viral Gastroenteritis Viral gastroenteritis is also known as stomach flu. This condition affects the stomach and intestinal tract. It can cause sudden diarrhea and vomiting. The illness typically lasts 3 to 8 days. Most people develop an immune response that eventually gets rid of the virus. While this natural response develops, the virus can make you quite ill. CAUSES  Many different viruses can cause gastroenteritis, such as rotavirus or noroviruses. You can catch one of these viruses by consuming contaminated food or water. You may also catch a virus by sharing utensils or other personal items with an infected person or by touching a contaminated surface. SYMPTOMS  The most common symptoms are diarrhea and vomiting. These problems can cause a severe loss of body fluids (dehydration) and a body salt (electrolyte) imbalance. Other symptoms may include:  Fever.  Headache.  Fatigue.  Abdominal pain. DIAGNOSIS  Your caregiver can usually diagnose viral gastroenteritis based on your symptoms and a physical exam. A stool sample may also be taken to test for the presence of viruses or other infections. TREATMENT  This illness typically goes away on its own. Treatments are aimed at rehydration. The most serious cases of viral gastroenteritis involve vomiting so severely that you are not able to keep fluids down. In these cases, fluids must be given through an intravenous line (IV). HOME CARE INSTRUCTIONS   Drink enough fluids to keep your urine clear or pale yellow. Drink small amounts of fluids frequently and increase the amounts as tolerated.  Ask your caregiver for specific rehydration instructions.  Avoid:  Foods high in sugar.  Alcohol.  Carbonated drinks.  Tobacco.  Juice.  Caffeine drinks.  Extremely hot or cold fluids.  Fatty, greasy foods.  Too much intake of anything at one time.  Dairy products until 24 to 48 hours after diarrhea stops.  You may consume probiotics.  Probiotics are active cultures of beneficial bacteria. They may lessen the amount and number of diarrheal stools in adults. Probiotics can be found in yogurt with active cultures and in supplements.  Wash your hands well to avoid spreading the virus.  Only take over-the-counter or prescription medicines for pain, discomfort, or fever as directed by your caregiver. Do not give aspirin to children. Antidiarrheal medicines are not recommended.  Ask your caregiver if you should continue to take your regular prescribed and over-the-counter medicines.  Keep all follow-up appointments as directed by your caregiver. SEEK IMMEDIATE MEDICAL CARE IF:   You are unable to keep fluids down.  You do not urinate at least once every 6 to 8 hours.  You develop shortness of breath.  You notice blood in your stool or vomit. This may look like coffee grounds.  You have abdominal pain that increases or is concentrated in one small area (localized).  You have persistent vomiting or diarrhea.  You have a fever.  The patient is a child younger than 3 months, and he or she has a fever.  The patient is a child older than 3 months, and he or she has a fever and persistent symptoms.  The patient is a child older than 3 months, and he or she has a fever and symptoms suddenly get worse.  The patient is a baby, and he or she has no tears when crying. MAKE SURE YOU:   Understand these instructions.  Will watch your condition.  Will get help right away if you are not doing well or get worse. Document Released: 12/17/2005 Document Revised: 03/10/2012 Document Reviewed: 10/03/2011   [employer] Patient Information 2014 [employer].  

## 2014-05-10 NOTE — ED Notes (Signed)
Pt unable to void at this time. 

## 2014-05-10 NOTE — ED Notes (Signed)
Pt woke up with abd pain, n/v/d this morning.  Pt denies taking any medications.

## 2014-05-12 ENCOUNTER — Encounter (HOSPITAL_COMMUNITY): Payer: Self-pay | Admitting: Emergency Medicine

## 2014-05-12 ENCOUNTER — Emergency Department (HOSPITAL_COMMUNITY): Payer: Medicaid Other

## 2014-05-12 ENCOUNTER — Emergency Department (HOSPITAL_COMMUNITY)
Admission: EM | Admit: 2014-05-12 | Discharge: 2014-05-12 | Disposition: A | Discharge status: Home / Self Care | Payer: Medicaid Other | Attending: Emergency Medicine | Admitting: Emergency Medicine

## 2014-05-12 DIAGNOSIS — E119 Type 2 diabetes mellitus without complications: Secondary | ICD-10-CM | POA: Insufficient documentation

## 2014-05-12 DIAGNOSIS — R197 Diarrhea, unspecified: Secondary | ICD-10-CM | POA: Insufficient documentation

## 2014-05-12 DIAGNOSIS — F17200 Nicotine dependence, unspecified, uncomplicated: Secondary | ICD-10-CM | POA: Insufficient documentation

## 2014-05-12 DIAGNOSIS — R109 Unspecified abdominal pain: Secondary | ICD-10-CM

## 2014-05-12 DIAGNOSIS — Z79899 Other long term (current) drug therapy: Secondary | ICD-10-CM | POA: Insufficient documentation

## 2014-05-12 DIAGNOSIS — R112 Nausea with vomiting, unspecified: Secondary | ICD-10-CM

## 2014-05-12 DIAGNOSIS — R1084 Generalized abdominal pain: Secondary | ICD-10-CM | POA: Insufficient documentation

## 2014-05-12 DIAGNOSIS — I10 Essential (primary) hypertension: Secondary | ICD-10-CM | POA: Insufficient documentation

## 2014-05-12 LAB — COMPREHENSIVE METABOLIC PANEL
ALT: 15 U/L (ref 0–53)
AST: 16 U/L (ref 0–37)
Albumin: 3.9 g/dL (ref 3.5–5.2)
Alkaline Phosphatase: 83 U/L (ref 39–117)
BUN: 13 mg/dL (ref 6–23)
CALCIUM: 9.4 mg/dL (ref 8.4–10.5)
CO2: 26 mEq/L (ref 19–32)
Chloride: 101 mEq/L (ref 96–112)
Creatinine, Ser: 1.07 mg/dL (ref 0.50–1.35)
GFR calc Af Amer: >90 mL/min (ref >90)
GFR calc non Af Amer: 80 mL/min — ABNORMAL LOW (ref >90)
Glucose, Bld: 187 mg/dL — ABNORMAL HIGH (ref 70–99)
Potassium: 4.3 mEq/L (ref 3.7–5.3)
Sodium: 140 mEq/L (ref 137–147)
Total Bilirubin: 0.4 mg/dL (ref 0.3–1.2)
Total Protein: 7.5 g/dL (ref 6.0–8.3)

## 2014-05-12 LAB — CBC WITH DIFFERENTIAL/PLATELET
BASOS ABS: 0 10*3/uL (ref 0.0–0.1)
BASOS PCT: 1 % (ref 0–1)
Eosinophils Absolute: 0.2 10*3/uL (ref 0.0–0.7)
Eosinophils Relative: 3 % (ref 0–5)
HEMATOCRIT: 44.5 % (ref 39.0–52.0)
Hemoglobin: 15.3 g/dL (ref 13.0–17.0)
Lymphocytes Relative: 35 % (ref 12–46)
Lymphs Abs: 1.9 10*3/uL (ref 0.7–4.0)
MCH: 31.7 pg (ref 26.0–34.0)
MCHC: 34.4 g/dL (ref 30.0–36.0)
MCV: 92.3 fL (ref 78.0–100.0)
MONO ABS: 0.6 10*3/uL (ref 0.1–1.0)
Monocytes Relative: 10 % (ref 3–12)
Neutro Abs: 2.8 10*3/uL (ref 1.7–7.7)
Neutrophils Relative %: 51 % (ref 43–77)
Platelets: 221 10*3/uL (ref 150–400)
RBC: 4.82 MIL/uL (ref 4.22–5.81)
RDW: 13.3 % (ref 11.5–15.5)
WBC: 5.5 10*3/uL (ref 4.0–10.5)

## 2014-05-12 LAB — URINALYSIS, ROUTINE W REFLEX MICROSCOPIC
BILIRUBIN URINE: NEGATIVE
GLUCOSE, UA: NEGATIVE mg/dL
Ketones, ur: NEGATIVE mg/dL
Leukocytes, UA: NEGATIVE
Nitrite: NEGATIVE
PH: 5.5 (ref 5.0–8.0)
Protein, ur: 30 mg/dL — AB
SPECIFIC GRAVITY, URINE: 1.024 (ref 1.005–1.030)
UROBILINOGEN UA: 0.2 mg/dL (ref 0.0–1.0)

## 2014-05-12 LAB — URINE MICROSCOPIC-ADD ON

## 2014-05-12 LAB — LIPASE, BLOOD: LIPASE: 32 U/L (ref 11–59)

## 2014-05-12 MED ORDER — IOHEXOL 300 MG/ML  SOLN
50.0000 mL | Freq: Once | INTRAMUSCULAR | Status: AC | PRN
  Administered 2014-05-12: 50 mL via ORAL

## 2014-05-12 MED ORDER — ONDANSETRON HCL 4 MG/2ML IJ SOLN
4.0000 mg | Freq: Once | INTRAMUSCULAR | Status: AC
  Administered 2014-05-12: 4 mg via INTRAVENOUS
  Filled 2014-05-12: qty 2

## 2014-05-12 MED ORDER — LISINOPRIL-HYDROCHLOROTHIAZIDE 10-12.5 MG PO TABS: 1.0000 | ORAL_TABLET | Freq: Every day | ORAL | Status: DC

## 2014-05-12 MED ORDER — LORAZEPAM 2 MG/ML IJ SOLN
1.0000 mg | Freq: Once | INTRAMUSCULAR | Status: AC
  Administered 2014-05-12: 17:00:00 via INTRAVENOUS
  Filled 2014-05-12: qty 1

## 2014-05-12 MED ORDER — PROMETHAZINE HCL 25 MG PO TABS: 25.0000 mg | ORAL_TABLET | Freq: Four times a day (QID) | ORAL | Status: DC | PRN

## 2014-05-12 MED ORDER — PROMETHAZINE HCL 25 MG/ML IJ SOLN
25.0000 mg | Freq: Once | INTRAMUSCULAR | Status: AC
  Administered 2014-05-12: 25 mg via INTRAVENOUS
  Filled 2014-05-12 (×2): qty 1

## 2014-05-12 MED ORDER — METOCLOPRAMIDE HCL 5 MG/ML IJ SOLN
10.0000 mg | Freq: Once | INTRAMUSCULAR | Status: AC
  Administered 2014-05-12: 10 mg via INTRAVENOUS
  Filled 2014-05-12: qty 2

## 2014-05-12 MED ORDER — METOCLOPRAMIDE HCL 10 MG PO TABS: 10.0000 mg | ORAL_TABLET | Freq: Four times a day (QID) | ORAL | Status: DC

## 2014-05-12 MED ORDER — HYDROMORPHONE HCL PF 1 MG/ML IJ SOLN
1.0000 mg | Freq: Once | INTRAMUSCULAR | Status: AC
  Administered 2014-05-12: 1 mg via INTRAVENOUS
  Filled 2014-05-12: qty 1

## 2014-05-12 MED ORDER — DICYCLOMINE HCL 10 MG/ML IM SOLN
20.0000 mg | Freq: Once | INTRAMUSCULAR | Status: AC
  Administered 2014-05-12: 20 mg via INTRAMUSCULAR
  Filled 2014-05-12: qty 2

## 2014-05-12 MED ORDER — GI COCKTAIL ~~LOC~~
30.0000 mL | Freq: Once | ORAL | Status: AC
  Administered 2014-05-12: 30 mL via ORAL
  Filled 2014-05-12: qty 30

## 2014-05-12 MED ORDER — CLONIDINE HCL 0.1 MG PO TABS
0.2000 mg | ORAL_TABLET | Freq: Once | ORAL | Status: AC
  Administered 2014-05-12: 0.2 mg via ORAL
  Filled 2014-05-12: qty 2

## 2014-05-12 MED ORDER — LABETALOL HCL 5 MG/ML IV SOLN: 10.0000 mg | Freq: Once | INTRAVENOUS | Status: DC

## 2014-05-12 MED ORDER — IOHEXOL 300 MG/ML  SOLN
100.0000 mL | Freq: Once | INTRAMUSCULAR | Status: AC | PRN
  Administered 2014-05-12: 100 mL via INTRAVENOUS

## 2014-05-12 NOTE — ED Provider Notes (Signed)
Medical screening examination/treatment/procedure(s) were performed by non-physician practitioner and as supervising physician I was immediately available for consultation/collaboration.   EKG Interpretation None       Hurman Horn, MD 05/12/14 2120

## 2014-05-12 NOTE — ED Provider Notes (Signed)
Medical screening examination/treatment/procedure(s) were performed by non-physician practitioner and as supervising physician I was immediately available for consultation/collaboration.   EKG Interpretation None       Doug Sou, MD 05/12/14 2244

## 2014-05-12 NOTE — ED Notes (Signed)
Pt reports here Monday at [facility] for presenting complaint.

## 2014-05-12 NOTE — ED Notes (Addendum)
Pt reports abdominal pain that started Monday and seen here previously. Pt still having generalized abdominal pain and nausea since this am after "having a good day yesterday". Pain 10/10. Pt vomiting in triage.

## 2014-05-12 NOTE — ED Provider Notes (Signed)
CSN: [id number]     Arrival date & time 05/12/14  1229 History   First MD Initiated Contact with Patient 05/12/14 1250     Chief Complaint  Patient presents with  . Abdominal Pain     (Consider location/radiation/quality/duration/timing/severity/associated sxs/prior Treatment) HPI Comments: Patient is a 49 year old male has history of diabetes and hypertension who presents to the emergency department today for nausea, vomiting, abdominal pain, diarrhea. He reports that he was seen for this on Monday and initially felt improved after IV medications. The pain in his abdomen is a throbbing, crampy pain. It is diffusely through his entire abdomen. He felt well yesterday, but worsened this morning. He has not been able to stop vomiting in here. He did not take any of the home medications he was prescribed because he did not have enough money. No fevers, chills, chest pain, shortness of breath. He denies any recent travel or eating any suspicious foods. No sick contacts.  Patient is a 49 y.o. male presenting with abdominal pain. The history is provided by the patient. No language interpreter was used.  Abdominal Pain Associated symptoms: diarrhea, nausea and vomiting   Associated symptoms: no chest pain, no fever and no shortness of breath     Past Medical History  Diagnosis Date  . Diabetes mellitus   . Hypertension    Past Surgical History  Procedure Laterality Date  . Wisdom tooth extraction      bottom LT, top RT   No family history on file. History  Substance Use Topics  . Smoking status: Current Every Day Smoker -- 0.25 packs/day    Types: Cigarettes  . Smokeless tobacco: Not on file  . Alcohol Use: Yes     Comment: very occasionally    Review of Systems  Constitutional: Negative for fever.  Respiratory: Negative for shortness of breath.   Cardiovascular: Negative for chest pain.  Gastrointestinal: Positive for nausea, vomiting, abdominal pain and diarrhea.  All other  systems reviewed and are negative.     Allergies  Aspirin  Home Medications   Prior to Admission medications   Medication Sig Start Date End Date Taking? Authorizing Provider  metFORMIN (GLUCOPHAGE) 500 MG tablet Take 500 mg by mouth 2 (two) times daily with a meal.   Yes Historical Provider, MD   BP 198/86  Pulse 61  Temp(Src) 98 F (36.7 C) (Oral)  Resp 18  SpO2 100% Physical Exam  Nursing note and vitals reviewed. Constitutional: He is oriented to person, place, and time. He appears well-developed and well-nourished. He appears distressed.  HENT:  Head: Normocephalic and atraumatic.  Right Ear: External ear normal.  Left Ear: External ear normal.  Nose: Nose normal.  Eyes: Conjunctivae are normal.  Neck: Normal range of motion. No tracheal deviation present.  Cardiovascular: Normal rate, regular rhythm and normal heart sounds.   Pulmonary/Chest: Effort normal and breath sounds normal. No stridor.  Abdominal: Soft. Bowel sounds are normal. He exhibits no distension. There is generalized tenderness. There is no rigidity and no rebound.  Patient is actively vomiting on exam. Abdomen is diffusely tender.  Musculoskeletal: Normal range of motion.  Neurological: He is alert and oriented to person, place, and time.  Skin: Skin is warm and dry. He is not diaphoretic.  Psychiatric: He has a normal mood and affect. His behavior is normal.    ED Course  Procedures (including critical care time) Labs Review Labs Reviewed  COMPREHENSIVE METABOLIC PANEL - Abnormal; Notable for the following:  Glucose, Bld 187 (*)    GFR calc non Af Amer 80 (*)    All other components within normal limits  URINALYSIS, ROUTINE W REFLEX MICROSCOPIC - Abnormal; Notable for the following:    Hgb urine dipstick SMALL (*)    Protein, ur 30 (*)    All other components within normal limits  URINE CULTURE  CBC WITH DIFFERENTIAL  LIPASE, BLOOD  URINE MICROSCOPIC-ADD ON    Imaging Review Ct  Abdomen Pelvis W Contrast  05/12/2014   CLINICAL DATA:  Persistent abdominal pain for 3 days with vomiting  EXAM: CT ABDOMEN AND PELVIS WITH CONTRAST  TECHNIQUE: Multidetector CT imaging of the abdomen and pelvis was performed using the standard protocol following bolus administration of intravenous contrast.  CONTRAST:  OMNIPAQUE IOHEXOL 300 MG/ML SOLN ; the patient did not receive oral contrast material.  COMPARISON:  None.  FINDINGS: The liver exhibits no focal mass nor ductal dilation. The gallbladder is adequately distended with no evidence of stones or wall thickening or surrounding inflammatory changes. The pancreas, spleen, adrenal glands, and kidneys exhibit no acute abnormalities. The caliber of the abdominal aorta is normal. There is no periaortic or pericaval lymphadenopathy. The psoas musculature is normal in appearance. On delayed images contrast within the renal collecting systems appears normal. The urinary bladder, prostate gland, and seminal vesicles appear normal for age.  The stomach is partially distended with fluid. The duodenum, jejunum, and ileum exhibit no evidence of acute inflammation or obstruction. A normal calibered, gas-filled appendix is demonstrated best on the coronal images numbers 37-49 of series 4. The colon is nondistended. There is subjective diffuse wall thickening of the transverse and descending and rectosigmoid portions of the colon. The pericolonic fat however exhibits normal density. The rectum is normal in appearance. There is no inguinal or umbilical hernia.  The lumbar spine and bony pelvis exhibit no acute abnormalities. The lung bases exhibit extensive emphysematous changes on the left.  IMPRESSION: 1. There is no evidence of acute hepatobiliary abnormality nor acute urinary tract abnormality. 2. There is no definite abnormality of the stomach or small bowel. There is subjective thickening of the wall of the transverse and descending portion of the colon but  this may merely be related to the nondistended state. The study is limited without administration of oral contrast material. Correlation with patient's clinical exam is needed. It may be that rescanning following administration of oral contrast allowing time for transit through the colon would be of value. 3. There is no intra-abdominal pelvic free fluid, lymphadenopathy, nor abscess formation. No free extraluminal gas collections are demonstrated.   Electronically Signed   By: David  [place]   On: 05/12/2014 16:04     EKG Interpretation None      MDM   Final diagnoses:  Abdominal pain  Nausea & vomiting  Hypertension   Patient is nontoxic, nonseptic appearing, in no apparent distress.  Patient's pain and other symptoms adequately managed in emergency department.  Fluid bolus given.  Labs, imaging and vitals reviewed.  Patient does not meet the SIRS or Sepsis criteria.  On repeat exam patient does not have a surgical abdomen and there are no peritoneal signs.  No indication of appendicitis, bowel obstruction, bowel perforation, cholecystitis, diverticulitis.  Patient still uncomfortable despite symptomatic therapy and benign imaging. Additional medications given. Patient signed out to [place], PA-C at change of shift. Plan to PO challenge patient after meds and discharge when feeling better.    Medications  HYDROmorphone (DILAUDID)  injection 1 mg (1 mg Intravenous Given 05/12/14 1422)  ondansetron (ZOFRAN) injection 4 mg (4 mg Intravenous Given 05/12/14 1422)  dicyclomine (BENTYL) injection 20 mg (20 mg Intramuscular Given 05/12/14 1422)  iohexol (OMNIPAQUE) 300 MG/ML solution 50 mL (50 mLs Oral Contrast Given 05/12/14 1432)  promethazine (PHENERGAN) injection 25 mg (25 mg Intravenous Given 05/12/14 1525)  iohexol (OMNIPAQUE) 300 MG/ML solution 100 mL (100 mLs Intravenous Contrast Given 05/12/14 1535)  ondansetron (ZOFRAN) injection 4 mg (4 mg Intravenous Given 05/12/14 1653)  HYDROmorphone  (DILAUDID) injection 1 mg (1 mg Intravenous Given 05/12/14 1657)  metoCLOPramide (REGLAN) injection 10 mg (10 mg Intravenous Given 05/12/14 1654)  gi cocktail (Maalox,Lidocaine,Donnatal) (30 mLs Oral Given 05/12/14 1659)  LORazepam (ATIVAN) injection 1 mg ( Intravenous Given 05/12/14 1656)  ondansetron (ZOFRAN) injection 4 mg (4 mg Intravenous Given 05/12/14 1938)  cloNIDine (CATAPRES) tablet 0.2 mg (0.2 mg Oral Given 05/12/14 2025)      Mora BellmanHannah S Jessenya Berdan, PA-C 05/13/14 0840

## 2014-05-12 NOTE — ED Notes (Signed)
Pt reports HX of elevated BP but takes nothing for elevated BP.

## 2014-05-12 NOTE — ED Provider Notes (Signed)
  Pt signed out to me at shift change. Pt with nausea, vomiting, abdominal pain. 2nd visit for the same. Pt had lab work and CT abd/pelvis whithout acute findings. CT did show poor visualization of wall of transverse and descending colon. Non distended state vs inflammation, no contrast in bowel bc pt refused to drink any. Pt's abdomen is soft with no specific tenderness over the area, will need further evaluation outpatient. Pt continues to have nausea and vomiting in ED despite all medications. Plan to try another round dilaudid, reglan, ativan, GI cocktail and reassess.   6:46 PM Pt feeling better. He tolerated PO sprite. His abdomen is soft, no guarding on exam. Pt wants to go home. Pt's bp is high in ED, severeal reads show normal BP, on the same arm, with same BP cuff. Pt states it is from pain. No prior prescriptions. Will recheck. He will need close follow up with PCP. Will d/c home with phenergan and reglan. Return precations discussed. At this time, no surgical abdomen.    Filed Vitals:   05/12/14 1323 05/12/14 1422 05/12/14 1549 05/12/14 1820  BP: 212/103 198/86 187/105 112/70  Pulse: 64 61 66 59  Temp:   98.6 F (37 C)   TempSrc:   Oral   Resp:  18 15 14   SpO2:  100% 100% 98%   8:41 PM BP elevated upon discharge. Pt is asymptomatic for it, however BP is 211/98. Will treat with clonidine, labetalol. Will start on BP meds at home. Lisinopril-hctz prescribed. Pt instructed to make sure to follow up with PCP. Return precautions given.   Lottie Mussel, PA-C 05/12/14 1855  Lottie Mussel, PA-C 05/12/14 1901  Lottie Mussel, PA-C 05/12/14 2042

## 2014-05-12 NOTE — ED Notes (Signed)
UNABLE TO DRINK ORAL CT CONTRAST

## 2014-05-12 NOTE — ED Notes (Signed)
Pt encouraged to void when able. 

## 2014-05-12 NOTE — ED Notes (Signed)
Pt aware a urine sample is needed. Pt states he is unable to urinate at this time due to constant vomiting.

## 2014-05-12 NOTE — Discharge Instructions (Signed)
Take reglan and or phenergan for nausea as prescribed. Stick with clear liquids for at least 24 hrs. BP intermittently elevated, you need close follow up in 2 weeks. Follow up with primary care doctor for further evaluation.    Cyclic Vomiting Syndrome Cyclic vomiting syndrome is a benign condition in which patients experience bouts or cycles of severe nausea and vomiting that last for hours or even days. The bouts of nausea and vomiting alternate with longer periods of no symptoms and generally good health. Cyclic vomiting syndrome occurs mostly in children, but can affect adults. CAUSES  [facility] has no known cause. Each episode is typically similar to the previous ones. The episodes tend to:   Start at about the same time of day.  Last the same length of time.  Present the same symptoms at the same level of intensity. Cyclic vomiting syndrome can begin at any age in children and adults. Cyclic vomiting syndrome usually starts between the ages of 3 and 7 years. In adults, episodes tend to occur less often than they do in children, but they last longer. Furthermore, the events or situations that trigger episodes in adults cannot always be pinpointed as easily as they can in children. There are 4 phases of cyclic vomiting syndrome: 1. Prodrome. The prodrome phase signals that an episode of nausea and vomiting is about to begin. This phase can last from just a few minutes to several hours. This phase is often marked by belly (abdominal) pain. Sometimes taking medicine early in the prodrome phase can stop an episode in progress. However, sometimes there is no warning. A person may simply wake up in the middle of the night or early morning and begin vomiting. 2. Episode. The episode phase consists of:  Severe vomiting.  Nausea.  Gagging (retching). 3. Recovery. The recovery phase begins when the nausea and vomiting stop. Healthy color, appetite, and energy return. 4. Symptom-free interval. The  symptom-free interval phase is the period between episodes when no symptoms are present. TRIGGERS Episodes can be triggered by an infection or event. Examples of triggers include:  Infections.  Colds, allergies, sinus problems, and the flu.  Eating certain foods such as chocolate or cheese.  Foods with monosodium glutamate (MSG) or preservatives.  Fast foods.  Pre-packaged foods.  Foods with low nutritional value (junk foods).  Overeating.  Eating just before going to bed.  Hot weather.  Dehydration.  Not enough sleep or poor sleep quality.  Physical exhaustion.  Menstruation.  Motion sickness.  Emotional stress (school or home difficulties).  Excitement or stress. SYMPTOMS  The main symptoms of cyclic vomiting syndrome are:  Severe vomiting.  Nausea.  Gagging (retching). Episodes usually begin at night or the first thing in the morning. Episodes may include vomiting or retching up to 5 or 6 times an hour during the worst of the episode. Episodes usually last anywhere from 1 to 4 days. Episodes can last for up to 10 days. Other symptoms include:  Paleness.  Exhaustion.  Listlessness.  Abdominal pain.  Loose stools or diarrhea. Sometimes the nausea and vomiting are so severe that a person appears to be almost unconscious. Sensitivity to light, headache, fever, dizziness, may also accompany an episode. In addition, the vomiting may cause drooling and excessive thirst. Drinking water usually leads to more vomiting, though the water can dilute the acid in the vomit, making the episode a little less painful. Continuous vomiting can lead to dehydration, which means that the body has lost excessive water  and salts. DIAGNOSIS  Cyclic vomiting syndrome is hard to diagnose because there are no clear tests to identify it. A caregiver must diagnose cyclic vomiting syndrome by looking at symptoms and medical history. A caregiver must exclude more common diseases or  disorders that can also cause nausea and vomiting. Also, diagnosis takes time because caregivers need to identify a pattern or cycle to the vomiting. TREATMENT  Cyclic vomiting syndrome cannot be cured. Treatment varies, but people with cyclic vomiting syndrome should get plenty of rest and sleep and take medications that prevent, stop, or lessen the vomiting episodes and other symptoms. People whose episodes are frequent and long-lasting may be treated during the symptom-free intervals in an effort to prevent or ease future episodes. The symptom-free phase is a good time to eliminate anything known to trigger an episode. For example, if episodes are brought on by stress or excitement, this period is the time to find ways to reduce stress and stay calm. If sinus problems or allergies cause episodes, those conditions should be treated. The triggers listed above should be avoided or prevented. Because of the similarities between migraine and cyclic vomiting syndrome, caregivers treat some people with severe cyclic vomiting syndrome with drugs that are also used for migraine headaches. The drugs are designed to:  Prevent episodes.  Reduce their frequency.  Lessen their severity. HOME CARE INSTRUCTIONS Once a vomiting episode begins, treatment is supportive. It helps to stay in bed and sleep in a dark, quiet room. Severe nausea and vomiting may require hospitalization and intravenous (IV) fluids to prevent dehydration. Relaxing medications (sedatives) may help if the nausea continues. Sometimes, during the prodrome phase, it is possible to stop an episode from happening altogether. Only take over-the-counter or prescription medicines for pain, discomfort or fever as directed by your caregiver. Do not give aspirin to children. During the recovery phase, drinking water and replacing lost electrolytes (salts in the blood) are very important. Electrolytes are salts that the body needs to function well and stay  healthy. Symptoms during the recovery phase can vary. Some people find that their appetites return to normal immediately, while others need to begin by drinking clear liquids and then move slowly to solid food. RELATED COMPLICATIONS The severe vomiting that defines cyclic vomiting syndrome is a risk factor for several complications:  Dehydration Vomiting causes the body to lose water quickly.  Electrolyte imbalance Vomiting also causes the body to lose the important salts it needs to keep working properly.  Peptic esophagitis The tube that connects the mouth to the stomach (esophagus) becomes injured from the stomach acid that comes up with the vomit.  Hematemesis The esophagus becomes irritated and bleeds, so blood mixes with the vomit.  Mallory-Weiss tear The lower end of the esophagus may tear open or the stomach may bruise from vomiting or retching.  Tooth decay The acid in the vomit can hurt the teeth by corroding the tooth enamel. SEEK MEDICAL CARE IF: You have questions or problems. Document Released: 02/25/2002 Document Revised: 03/10/2012 Document Reviewed: 03/26/2011 [facility] Patient Information 2014 [place], [place].

## 2014-05-13 LAB — URINE CULTURE

## 2014-05-14 NOTE — ED Provider Notes (Signed)
Medical screening examination/treatment/procedure(s) were performed by non-physician practitioner and as supervising physician I was immediately available for consultation/collaboration.   EKG Interpretation None       Courtney F Horton, MD 05/14/14 0813 

## 2014-06-28 ENCOUNTER — Emergency Department (HOSPITAL_COMMUNITY)
Admission: EM | Admit: 2014-06-28 | Discharge: 2014-06-28 | Discharge status: Against Medical Advice | Payer: Medicaid Other

## 2014-06-28 ENCOUNTER — Emergency Department (HOSPITAL_COMMUNITY): Payer: Medicaid Other

## 2014-06-28 ENCOUNTER — Encounter (HOSPITAL_COMMUNITY): Payer: Self-pay | Admitting: Emergency Medicine

## 2014-06-28 ENCOUNTER — Emergency Department (HOSPITAL_COMMUNITY)
Admission: EM | Admit: 2014-06-28 | Discharge: 2014-06-29 | Disposition: A | Discharge status: Home / Self Care | Payer: Medicaid Other | Attending: Emergency Medicine | Admitting: Emergency Medicine

## 2014-06-28 DIAGNOSIS — F17200 Nicotine dependence, unspecified, uncomplicated: Secondary | ICD-10-CM | POA: Diagnosis not present

## 2014-06-28 DIAGNOSIS — Z8719 Personal history of other diseases of the digestive system: Secondary | ICD-10-CM | POA: Insufficient documentation

## 2014-06-28 DIAGNOSIS — R112 Nausea with vomiting, unspecified: Secondary | ICD-10-CM

## 2014-06-28 DIAGNOSIS — A088 Other specified intestinal infections: Secondary | ICD-10-CM | POA: Insufficient documentation

## 2014-06-28 DIAGNOSIS — E119 Type 2 diabetes mellitus without complications: Secondary | ICD-10-CM | POA: Diagnosis not present

## 2014-06-28 DIAGNOSIS — Z79899 Other long term (current) drug therapy: Secondary | ICD-10-CM | POA: Insufficient documentation

## 2014-06-28 DIAGNOSIS — I10 Essential (primary) hypertension: Secondary | ICD-10-CM | POA: Diagnosis not present

## 2014-06-28 DIAGNOSIS — A084 Viral intestinal infection, unspecified: Secondary | ICD-10-CM

## 2014-06-28 DIAGNOSIS — I498 Other specified cardiac arrhythmias: Secondary | ICD-10-CM | POA: Diagnosis not present

## 2014-06-28 DIAGNOSIS — R109 Unspecified abdominal pain: Secondary | ICD-10-CM | POA: Diagnosis present

## 2014-06-28 DIAGNOSIS — R197 Diarrhea, unspecified: Secondary | ICD-10-CM

## 2014-06-28 HISTORY — DX: Gastro-esophageal reflux disease without esophagitis: K21.9

## 2014-06-28 LAB — CBC WITH DIFFERENTIAL/PLATELET
BASOS PCT: 0 % (ref 0–1)
Basophils Absolute: 0 10*3/uL (ref 0.0–0.1)
Eosinophils Absolute: 0 10*3/uL (ref 0.0–0.7)
Eosinophils Relative: 0 % (ref 0–5)
HCT: 43.4 % (ref 39.0–52.0)
HEMOGLOBIN: 14.9 g/dL (ref 13.0–17.0)
Lymphocytes Relative: 9 % — ABNORMAL LOW (ref 12–46)
Lymphs Abs: 0.6 10*3/uL — ABNORMAL LOW (ref 0.7–4.0)
MCH: 31.2 pg (ref 26.0–34.0)
MCHC: 34.3 g/dL (ref 30.0–36.0)
MCV: 90.8 fL (ref 78.0–100.0)
MONOS PCT: 4 % (ref 3–12)
Monocytes Absolute: 0.2 10*3/uL (ref 0.1–1.0)
NEUTROS ABS: 5.1 10*3/uL (ref 1.7–7.7)
NEUTROS PCT: 87 % — AB (ref 43–77)
Platelets: 203 10*3/uL (ref 150–400)
RBC: 4.78 MIL/uL (ref 4.22–5.81)
RDW: 13.1 % (ref 11.5–15.5)
WBC: 5.9 10*3/uL (ref 4.0–10.5)

## 2014-06-28 LAB — COMPREHENSIVE METABOLIC PANEL
ALBUMIN: 4.3 g/dL (ref 3.5–5.2)
ALK PHOS: 97 U/L (ref 39–117)
ALT: 14 U/L (ref 0–53)
AST: 15 U/L (ref 0–37)
BILIRUBIN TOTAL: 0.4 mg/dL (ref 0.3–1.2)
BUN: 10 mg/dL (ref 6–23)
CHLORIDE: 101 meq/L (ref 96–112)
CO2: 22 mEq/L (ref 19–32)
Calcium: 9.4 mg/dL (ref 8.4–10.5)
Creatinine, Ser: 1.02 mg/dL (ref 0.50–1.35)
GFR calc Af Amer: >90 mL/min (ref >90)
GFR calc non Af Amer: 84 mL/min — ABNORMAL LOW (ref >90)
Glucose, Bld: 174 mg/dL — ABNORMAL HIGH (ref 70–99)
POTASSIUM: 3.5 meq/L — AB (ref 3.7–5.3)
Sodium: 141 mEq/L (ref 137–147)
Total Protein: 7.5 g/dL (ref 6.0–8.3)

## 2014-06-28 LAB — I-STAT TROPONIN, ED: Troponin i, poc: 0.08 ng/mL (ref 0.00–0.08)

## 2014-06-28 LAB — LIPASE, BLOOD: Lipase: 12 U/L (ref 11–59)

## 2014-06-28 MED ORDER — SODIUM CHLORIDE 0.9 % IV BOLUS (SEPSIS)
1000.0000 mL | INTRAVENOUS | Status: AC
  Administered 2014-06-29: 1000 mL via INTRAVENOUS

## 2014-06-28 MED ORDER — ONDANSETRON HCL 4 MG/2ML IJ SOLN
INTRAMUSCULAR | Status: AC
  Filled 2014-06-28: qty 2

## 2014-06-28 MED ORDER — MORPHINE SULFATE 4 MG/ML IJ SOLN
4.0000 mg | Freq: Once | INTRAMUSCULAR | Status: AC
  Administered 2014-06-29: 4 mg via INTRAVENOUS
  Filled 2014-06-28: qty 1

## 2014-06-28 MED ORDER — ONDANSETRON HCL 4 MG/2ML IJ SOLN
4.0000 mg | Freq: Once | INTRAMUSCULAR | Status: AC
  Administered 2014-06-29: 4 mg via INTRAVENOUS
  Filled 2014-06-28: qty 2

## 2014-06-28 MED ORDER — SODIUM CHLORIDE 0.9 % IV SOLN
1000.0000 mL | Freq: Once | INTRAVENOUS | Status: AC
  Administered 2014-06-28: 1000 mL via INTRAVENOUS

## 2014-06-28 MED ORDER — ONDANSETRON HCL 4 MG/2ML IJ SOLN
4.0000 mg | Freq: Once | INTRAMUSCULAR | Status: AC
  Administered 2014-06-28: 4 mg via INTRAVENOUS

## 2014-06-28 NOTE — ED Notes (Signed)
Called for pt x 2 for triage.  No response

## 2014-06-28 NOTE — ED Notes (Signed)
Patient tried to urinate, but was unable to. Will try again in a bit.

## 2014-06-28 NOTE — ED Notes (Addendum)
Patient states he was here earlier for acid reflux complaint, LWBS. Patient states when he got home he began having elft sided sharp chest pain. Patient with emesis x1-15 episodes. Patient with one episode of yellow emesis in triage. Patient states CP has subsided at this time.

## 2014-06-28 NOTE — ED Notes (Signed)
Patient is still unable to urinate 

## 2014-06-28 NOTE — ED Provider Notes (Signed)
CSN: [id number]     Arrival date & time 06/28/14  1949 History   First MD Initiated Contact with Patient 06/28/14 2223     Chief Complaint  Patient presents with  . Chest Pain  . Abdominal Pain    with vomiting     (Consider location/radiation/quality/duration/timing/severity/associated sxs/prior Treatment) HPI Comments: Patient presents emergency department with chief complaint of abdominal pain. He reports associated nausea, vomiting, diarrhea. He states that the symptoms began yesterday, and have progressively worsened. He reports approximately 15 episodes of vomiting. Reports her vomit has been stomach contents. Denies seeing any blood. Originally, he reported some mild chest pain, but he attributes this to his persistent vomiting, and states that he does not have any chest pain now. He denies any history of chest pain in the past. Denies any associated shortness of breath. There no aggravating or relieving factors. Patient denies any abdominal surgical history.  The history is provided by the patient. No language interpreter was used.    Past Medical History  Diagnosis Date  . Diabetes mellitus   . Hypertension   . GERD (gastroesophageal reflux disease)    Past Surgical History  Procedure Laterality Date  . Wisdom tooth extraction      bottom LT, top RT   No family history on file. History  Substance Use Topics  . Smoking status: Current Every Day Smoker -- 0.50 packs/day    Types: Cigarettes  . Smokeless tobacco: Not on file  . Alcohol Use: Yes     Comment: very occasionally    Review of Systems  All other systems reviewed and are negative.     Allergies  Aspirin  Home Medications   Prior to Admission medications   Medication Sig Start Date End Date Taking? Authorizing Antonis Lor  metFORMIN (GLUCOPHAGE) 500 MG tablet Take 500 mg by mouth 2 (two) times daily with a meal.   Yes Historical Crystalee Ventress, MD   BP 196/77  Pulse 45  Temp(Src) 98.2 F (36.8 C) (Oral)   Resp 14  SpO2 97% Physical Exam  Nursing note and vitals reviewed. Constitutional: He is oriented to person, place, and time. He appears well-developed and well-nourished.  HENT:  Head: Normocephalic and atraumatic.  Eyes: Conjunctivae and EOM are normal. Pupils are equal, round, and reactive to light. Right eye exhibits no discharge. Left eye exhibits no discharge. No scleral icterus.  Neck: Normal range of motion. Neck supple. No JVD present.  Cardiovascular: Regular rhythm and normal heart sounds.  Exam reveals no gallop and no friction rub.   No murmur heard. Bradycardia  Pulmonary/Chest: Effort normal and breath sounds normal. No respiratory distress. He has no wheezes. He has no rales. He exhibits no tenderness.  Abdominal: Soft. He exhibits no distension and no mass. There is no tenderness. There is no rebound and no guarding.  Diffuse crampy upper abdominal tenderness, mostly in the left upper quadrant, no focal lower abdominal tenderness, no pain at McBurney's point  Musculoskeletal: Normal range of motion. He exhibits no edema and no tenderness.  Neurological: He is alert and oriented to person, place, and time.  Skin: Skin is warm and dry.  Psychiatric: He has a normal mood and affect. His behavior is normal. Judgment and thought content normal.    ED Course  Procedures (including critical care time) Results for orders placed during the hospital encounter of 06/28/14  CBC WITH DIFFERENTIAL      Result Value Ref Range   WBC 5.9  4.0 - 10.5 K/uL  RBC 4.78  4.22 - 5.81 MIL/uL   Hemoglobin 14.9  13.0 - 17.0 g/dL   HCT 45.6  25.6 - 38.9 %   MCV 90.8  78.0 - 100.0 fL   MCH 31.2  26.0 - 34.0 pg   MCHC 34.3  30.0 - 36.0 g/dL   RDW 37.3  42.8 - 76.8 %   Platelets 203  150 - 400 K/uL   Neutrophils Relative % 87 (*) 43 - 77 %   Neutro Abs 5.1  1.7 - 7.7 K/uL   Lymphocytes Relative 9 (*) 12 - 46 %   Lymphs Abs 0.6 (*) 0.7 - 4.0 K/uL   Monocytes Relative 4  3 - 12 %   Monocytes  Absolute 0.2  0.1 - 1.0 K/uL   Eosinophils Relative 0  0 - 5 %   Eosinophils Absolute 0.0  0.0 - 0.7 K/uL   Basophils Relative 0  0 - 1 %   Basophils Absolute 0.0  0.0 - 0.1 K/uL  COMPREHENSIVE METABOLIC PANEL      Result Value Ref Range   Sodium 141  137 - 147 mEq/L   Potassium 3.5 (*) 3.7 - 5.3 mEq/L   Chloride 101  96 - 112 mEq/L   CO2 22  19 - 32 mEq/L   Glucose, Bld 174 (*) 70 - 99 mg/dL   BUN 10  6 - 23 mg/dL   Creatinine, Ser 1.15  0.50 - 1.35 mg/dL   Calcium 9.4  8.4 - 72.6 mg/dL   Total Protein 7.5  6.0 - 8.3 g/dL   Albumin 4.3  3.5 - 5.2 g/dL   AST 15  0 - 37 U/L   ALT 14  0 - 53 U/L   Alkaline Phosphatase 97  39 - 117 U/L   Total Bilirubin 0.4  0.3 - 1.2 mg/dL   GFR calc non Af Amer 84 (*) >90 mL/min   GFR calc Af Amer >90  >90 mL/min  LIPASE, BLOOD      Result Value Ref Range   Lipase 12  11 - 59 U/L  TROPONIN I      Result Value Ref Range   Troponin I <0.30  <0.30 ng/mL  I-STAT TROPOININ, ED      Result Value Ref Range   Troponin i, poc 0.08  0.00 - 0.08 ng/mL   Comment 3            Dg Abd Acute W/chest  06/28/2014   CLINICAL DATA:  Chest and abdominal Pain.  Rule out free air  EXAM: ACUTE ABDOMEN SERIES (ABDOMEN 2 VIEW & CHEST 1 VIEW)  COMPARISON:  Abdominal CT 05/12/2014  FINDINGS: Fine linear densities at the left base correlate with blebs. Linear opacity in the peripheral left mid chest is stable from 09/28/2013 chest x-ray, consistent with scar. No consolidation, edema, effusion, or pneumothorax. Normal heart size and mediastinal contours.  There are some prominent loops of gas-filled small bowel, but no distension or fluid level to suggest obstruction. No pneumoperitoneum. No suspicious intra-abdominal mass effect or calcification.  IMPRESSION: 1. No evidence of bowel obstruction or perforation. 2. No acute intrathoracic findings.   Electronically Signed   By: Tiburcio Pea M.D.   On: 06/28/2014 23:55      EKG Interpretation None      MDM   Final  diagnoses:  None   Patient with upper abdominal pain, and one episode of chest pain. Also with associated nausea, vomiting, and diarrhea since yesterday. Labs are  reassuring. Will treat with Zofran, give fluids, and some pain medicine. Suspect that the upper abdominal pain is related to the patient's retching from vomiting.  Delta troponin at 0230.  Patient signed out to [place], PA-C.  Plan:  Followup on troponin and EKG. If negative, and patient feels better, discharge to home.    Roxy Horseman, PA-C 06/29/14 [phone]

## 2014-06-29 LAB — TROPONIN I: Troponin I: <0.3 ng/mL (ref <0.30)

## 2014-06-29 MED ORDER — PROMETHAZINE HCL 25 MG PO TABS: 25.0000 mg | ORAL_TABLET | Freq: Four times a day (QID) | ORAL | Status: DC | PRN

## 2014-06-29 MED ORDER — HYDROMORPHONE HCL PF 1 MG/ML IJ SOLN
1.0000 mg | Freq: Once | INTRAMUSCULAR | Status: AC
  Administered 2014-06-29: 1 mg via INTRAVENOUS
  Filled 2014-06-29: qty 1

## 2014-06-29 NOTE — ED Provider Notes (Signed)
[name],Alan Diaz 1:00 AM patient discussed in sign out. Patient with upper abdominal pain slightly radiating into the chest. Also with recent nausea, vomiting and watery diarrhea suspicious for viral gastroenteritis given patient'Diaz chest discomforts will perform delta troponin.   3:50 AM second troponin negative. No significant change in the EKG with slightly abnormal inverted T waves anteriorly otherwise unremarkable. T wave inversions have been present since 2014. At this time patient stable for discharge home. Strict return percussions given.  Angus Seller, PA-C 06/29/14 [phone]

## 2014-06-29 NOTE — ED Provider Notes (Signed)
Medical screening examination/treatment/procedure(s) were performed by non-physician practitioner and as supervising physician I was immediately available for consultation/collaboration.   EKG Interpretation   Date/Time:  Monday June 28 2014 19:59:38 EDT Ventricular Rate:  52 PR Interval:  142 QRS Duration: 96 QT Interval:  486 QTC Calculation: 451 R Axis:   73 Text Interpretation:  Sinus bradycardia T wave abnormality, consider  anterior ischemia Abnormal ECG No significant change since prior ekgs   n3ew t wave inversion in v Confirmed by OTTER  MD, OLGA ([id number]) on  06/28/2014 11:53:56 PM       Olivia Mackie, MD 06/29/14 [phone]

## 2014-06-29 NOTE — Discharge Instructions (Signed)
At this time your provider(s) feel that you may have a viral gastroenteritis infection causing your vomiting and diarrhea. Drink plenty of fluids to stay hydrated. Followup with a primary care provider for continued evaluation and treatment. Return for any changing or worsening symptoms.    Viral Gastroenteritis Viral gastroenteritis is also called stomach flu. This illness is caused by a certain type of germ (virus). It can cause sudden watery poop (diarrhea) and throwing up (vomiting). This can cause you to lose body fluids (dehydration). This illness usually lasts for 3 to 8 days. It usually goes away on its own. HOME CARE   Drink enough fluids to keep your pee (urine) clear or pale yellow. Drink small amounts of fluids often.  Ask your doctor how to replace body fluid losses (rehydration).  Avoid:  Foods high in sugar.  Alcohol.  Bubbly (carbonated) drinks.  Tobacco.  Juice.  Caffeine drinks.  Very hot or cold fluids.  Fatty, greasy foods.  Eating too much at one time.  Dairy products until 24 to 48 hours after your watery poop stops.  You may eat foods with active cultures (probiotics). They can be found in some yogurts and supplements.  Wash your hands well to avoid spreading the illness.  Only take medicines as told by your doctor. Do not give aspirin to children. Do not take medicines for watery poop (antidiarrheals).  Ask your doctor if you should keep taking your regular medicines.  Keep all doctor visits as told. GET HELP RIGHT AWAY IF:   You cannot keep fluids down.  You do not pee at least once every 6 to 8 hours.  You are short of breath.  You see blood in your poop or throw up. This may look like coffee grounds.  You have belly (abdominal) pain that gets worse or is just in one small spot (localized).  You keep throwing up or having watery poop.  You have a fever.  The patient is a child younger than 3 months, and he or she has a fever.  The  patient is a child older than 3 months, and he or she has a fever and problems that do not go away.  The patient is a child older than 3 months, and he or she has a fever and problems that suddenly get worse.  The patient is a baby, and he or she has no tears when crying. MAKE SURE YOU:   Understand these instructions.  Will watch your condition.  Will get help right away if you are not doing well or get worse. Document Released: 06/04/2008 Document Revised: 03/10/2012 Document Reviewed: 10/03/2011 [facility] Patient Information 2015 [place], [place]. This information is not intended to replace advice given to you by your health care provider. Make sure you discuss any questions you have with your health care provider.

## 2014-06-29 NOTE — ED Provider Notes (Signed)
Medical screening examination/treatment/procedure(s) were performed by non-physician practitioner and as supervising physician I was immediately available for consultation/collaboration.   EKG Interpretation   Date/Time:  Monday June 28 2014 19:59:38 EDT Ventricular Rate:  52 PR Interval:  142 QRS Duration: 96 QT Interval:  486 QTC Calculation: 451 R Axis:   73 Text Interpretation:  Sinus bradycardia T wave abnormality, consider  anterior ischemia Abnormal ECG No significant change since prior ekgs   n3ew t wave inversion in v Confirmed by OTTER  MD, OLGA ([id number]) on  06/28/2014 11:53:56 PM       Olivia Mackie, MD 06/29/14 [phone]

## 2014-08-22 ENCOUNTER — Encounter (HOSPITAL_COMMUNITY): Payer: Self-pay | Admitting: Emergency Medicine

## 2014-08-22 ENCOUNTER — Emergency Department (HOSPITAL_COMMUNITY)
Admission: EM | Admit: 2014-08-22 | Discharge: 2014-08-22 | Disposition: A | Discharge status: Home / Self Care | Payer: Medicaid Other | Attending: Emergency Medicine | Admitting: Emergency Medicine

## 2014-08-22 DIAGNOSIS — I10 Essential (primary) hypertension: Secondary | ICD-10-CM | POA: Insufficient documentation

## 2014-08-22 DIAGNOSIS — E119 Type 2 diabetes mellitus without complications: Secondary | ICD-10-CM | POA: Diagnosis not present

## 2014-08-22 DIAGNOSIS — F17200 Nicotine dependence, unspecified, uncomplicated: Secondary | ICD-10-CM | POA: Diagnosis not present

## 2014-08-22 DIAGNOSIS — R197 Diarrhea, unspecified: Secondary | ICD-10-CM | POA: Insufficient documentation

## 2014-08-22 DIAGNOSIS — K2970 Gastritis, unspecified, without bleeding: Secondary | ICD-10-CM

## 2014-08-22 DIAGNOSIS — K2990 Gastroduodenitis, unspecified, without bleeding: Secondary | ICD-10-CM | POA: Diagnosis not present

## 2014-08-22 DIAGNOSIS — R1084 Generalized abdominal pain: Secondary | ICD-10-CM | POA: Diagnosis present

## 2014-08-22 DIAGNOSIS — R030 Elevated blood-pressure reading, without diagnosis of hypertension: Secondary | ICD-10-CM

## 2014-08-22 DIAGNOSIS — IMO0001 Reserved for inherently not codable concepts without codable children: Secondary | ICD-10-CM

## 2014-08-22 DIAGNOSIS — Z79899 Other long term (current) drug therapy: Secondary | ICD-10-CM | POA: Diagnosis not present

## 2014-08-22 LAB — CBC WITH DIFFERENTIAL/PLATELET
Basophils Absolute: 0 10*3/uL (ref 0.0–0.1)
Basophils Relative: 0 % (ref 0–1)
Eosinophils Absolute: 0 10*3/uL (ref 0.0–0.7)
Eosinophils Relative: 0 % (ref 0–5)
HCT: 45 % (ref 39.0–52.0)
Hemoglobin: 15.4 g/dL (ref 13.0–17.0)
LYMPHS ABS: 0.7 10*3/uL (ref 0.7–4.0)
Lymphocytes Relative: 9 % — ABNORMAL LOW (ref 12–46)
MCH: 31.4 pg (ref 26.0–34.0)
MCHC: 34.2 g/dL (ref 30.0–36.0)
MCV: 91.8 fL (ref 78.0–100.0)
MONOS PCT: 3 % (ref 3–12)
Monocytes Absolute: 0.2 10*3/uL (ref 0.1–1.0)
Neutro Abs: 6.8 10*3/uL (ref 1.7–7.7)
Neutrophils Relative %: 88 % — ABNORMAL HIGH (ref 43–77)
Platelets: 214 10*3/uL (ref 150–400)
RBC: 4.9 MIL/uL (ref 4.22–5.81)
RDW: 13.4 % (ref 11.5–15.5)
WBC: 7.7 10*3/uL (ref 4.0–10.5)

## 2014-08-22 LAB — COMPREHENSIVE METABOLIC PANEL
ALBUMIN: 4.3 g/dL (ref 3.5–5.2)
ALK PHOS: 100 U/L (ref 39–117)
ALT: 16 U/L (ref 0–53)
ANION GAP: 17 — AB (ref 5–15)
AST: 13 U/L (ref 0–37)
BUN: 10 mg/dL (ref 6–23)
CHLORIDE: 103 meq/L (ref 96–112)
CO2: 22 mEq/L (ref 19–32)
Calcium: 9.6 mg/dL (ref 8.4–10.5)
Creatinine, Ser: 1.02 mg/dL (ref 0.50–1.35)
GFR calc Af Amer: >90 mL/min (ref >90)
GFR calc non Af Amer: 84 mL/min — ABNORMAL LOW (ref >90)
Glucose, Bld: 210 mg/dL — ABNORMAL HIGH (ref 70–99)
Potassium: 4.2 mEq/L (ref 3.7–5.3)
Sodium: 142 mEq/L (ref 137–147)
Total Bilirubin: 0.4 mg/dL (ref 0.3–1.2)
Total Protein: 8.1 g/dL (ref 6.0–8.3)

## 2014-08-22 LAB — URINALYSIS, ROUTINE W REFLEX MICROSCOPIC
BILIRUBIN URINE: NEGATIVE
Glucose, UA: 250 mg/dL — AB
KETONES UR: 40 mg/dL — AB
Leukocytes, UA: NEGATIVE
Nitrite: NEGATIVE
PH: 5.5 (ref 5.0–8.0)
Protein, ur: 100 mg/dL — AB
Specific Gravity, Urine: 1.026 (ref 1.005–1.030)
Urobilinogen, UA: 0.2 mg/dL (ref 0.0–1.0)

## 2014-08-22 LAB — LIPASE, BLOOD: Lipase: 13 U/L (ref 11–59)

## 2014-08-22 LAB — URINE MICROSCOPIC-ADD ON

## 2014-08-22 MED ORDER — ONDANSETRON HCL 4 MG/2ML IJ SOLN
4.0000 mg | Freq: Once | INTRAMUSCULAR | Status: AC
  Administered 2014-08-22: 4 mg via INTRAVENOUS
  Filled 2014-08-22: qty 2

## 2014-08-22 MED ORDER — HYDROMORPHONE HCL PF 1 MG/ML IJ SOLN
1.0000 mg | Freq: Once | INTRAMUSCULAR | Status: AC
  Administered 2014-08-22: 1 mg via INTRAVENOUS
  Filled 2014-08-22: qty 1

## 2014-08-22 MED ORDER — ONDANSETRON 4 MG PO TBDP: 4.0000 mg | ORAL_TABLET | Freq: Three times a day (TID) | ORAL | Status: DC | PRN

## 2014-08-22 MED ORDER — SODIUM CHLORIDE 0.9 % IV BOLUS (SEPSIS)
1000.0000 mL | Freq: Once | INTRAVENOUS | Status: AC
  Administered 2014-08-22: 1000 mL via INTRAVENOUS

## 2014-08-22 MED ORDER — PANTOPRAZOLE SODIUM 40 MG IV SOLR
40.0000 mg | Freq: Once | INTRAVENOUS | Status: AC
  Administered 2014-08-22: 40 mg via INTRAVENOUS
  Filled 2014-08-22: qty 40

## 2014-08-22 MED ORDER — FAMOTIDINE 20 MG PO TABS: 20.0000 mg | ORAL_TABLET | Freq: Two times a day (BID) | ORAL | Status: DC

## 2014-08-22 NOTE — ED Provider Notes (Signed)
CSN: [id number]     Arrival date & time 08/22/14  1901 History   First MD Initiated Contact with Patient 08/22/14 2012     Chief Complaint  Patient presents with  . Abdominal Pain     (Consider location/radiation/quality/duration/timing/severity/associated sxs/prior Treatment) HPI Comments: Patient with history of GERD, gastritis -- presents with complaint of nausea, vomiting, epigastric pain, loose stool that began early this morning after eating spicy food last night. Patient states he gets symptoms approximately once a month, typically after a spicy meal, for the past year. He has been having episodes like this over the past year. Patient has had 2 ED visits in the past 3 months. At those times, he had a negative CT abdomen/pelvis, negative acute abdominal series. Pain and symptoms are exactly the same as his previous visits. No treatments prior to arrival. No fever. Vomiting is nonbloody, nonbilious. He has not followed up with a gastroenterologist. Onset of symptoms acute. Course is intermittent. Nothing makes symptoms better or worse.  The history is provided by the patient and medical records.    Past Medical History  Diagnosis Date  . Diabetes mellitus   . Hypertension   . GERD (gastroesophageal reflux disease)    Past Surgical History  Procedure Laterality Date  . Wisdom tooth extraction      bottom LT, top RT   History reviewed. No pertinent family history. History  Substance Use Topics  . Smoking status: Current Every Day Smoker -- 0.50 packs/day    Types: Cigarettes  . Smokeless tobacco: Not on file  . Alcohol Use: Yes     Comment: very occasionally    Review of Systems  Constitutional: Negative for fever.  HENT: Negative for rhinorrhea and sore throat.   Eyes: Negative for redness.  Respiratory: Negative for cough.   Cardiovascular: Negative for chest pain.  Gastrointestinal: Positive for nausea, vomiting, abdominal pain and diarrhea. Negative for blood in  stool.  Genitourinary: Negative for dysuria.  Musculoskeletal: Negative for myalgias.  Skin: Negative for rash.  Neurological: Negative for headaches.     Allergies  Aspirin  Home Medications   Prior to Admission medications   Medication Sig Start Date End Date Taking? Authorizing Provider  metFORMIN (GLUCOPHAGE) 500 MG tablet Take 500 mg by mouth 2 (two) times daily with a meal.    Historical Provider, MD  promethazine (PHENERGAN) 25 MG tablet Take 1 tablet (25 mg total) by mouth every 6 (six) hours as needed for nausea. 06/29/14   Phill Mutter [name], PA-C   BP 189/93  Pulse 80  Temp(Src) 98.5 F (36.9 C) (Oral)  Resp 18  Wt 180 lb (81.647 kg)  SpO2 100% Physical Exam  Nursing note and vitals reviewed. Constitutional: He appears well-developed and well-nourished.  HENT:  Head: Normocephalic and atraumatic.  Eyes: Conjunctivae are normal. Right eye exhibits no discharge. Left eye exhibits no discharge.  Neck: Normal range of motion. Neck supple.  Cardiovascular: Normal rate, regular rhythm and normal heart sounds.   No murmur heard. Pulmonary/Chest: Effort normal and breath sounds normal. No respiratory distress. He has no wheezes. He has no rales.  Abdominal: Soft. He exhibits no distension. There is tenderness (generalized). There is no rebound and no guarding.  Neurological: He is alert.  Skin: Skin is warm and dry.  Psychiatric: He has a normal mood and affect.    ED Course  Procedures (including critical care time) Labs Review Labs Reviewed  CBC WITH DIFFERENTIAL - Abnormal; Notable for the following:  Neutrophils Relative % 88 (*)    Lymphocytes Relative 9 (*)    All other components within normal limits  COMPREHENSIVE METABOLIC PANEL - Abnormal; Notable for the following:    Glucose, Bld 210 (*)    GFR calc non Af Amer 84 (*)    Anion gap 17 (*)    All other components within normal limits  URINALYSIS, ROUTINE W REFLEX MICROSCOPIC - Abnormal; Notable for the  following:    Glucose, UA 250 (*)    Hgb urine dipstick LARGE (*)    Ketones, ur 40 (*)    Protein, ur 100 (*)    All other components within normal limits  LIPASE, BLOOD  URINE MICROSCOPIC-ADD ON    Imaging Review No results found.   EKG Interpretation None      8:29 PM Patient seen and examined. Work-up initiated. Medications ordered.   Vital signs reviewed and are as follows: BP 189/93  Pulse 80  Temp(Src) 98.5 F (36.9 C) (Oral)  Resp 18  Wt 180 lb (81.647 kg)  SpO2 100%  11:25 PM Patient with resolution of pain after second dose of dilaudid. Tolerating water. States he is ready to go home.   Will d/c with pepcid, zofran, GI referral.   Patient was informed of high blood pressure reading today. Patient was counseled about long-term health problems hypertension can cause and was urged to follow-up with a primary care doctor in the next week for a blood pressure recheck. Patient verbalized understanding. PCP referral given.    MDM   Final diagnoses:  Gastritis  Elevated blood pressure   Patient with nausea, vomiting, and epigastric pain after spicy food -- same as previous episodes. He had neg imaging in past. Do not feel re-imaging is indicated tonight. Sx improved with pain medication, protonix, zofran. Labs unremarkable. Mild hyperglycemia but no signs of DKA.   Asked pt to f/u regarding elevated BP. No gross signs of end-organ damage and suspect there is some elevation 2/2 pain, vomiting.   No dangerous or life-threatening conditions suspected or identified by history, physical exam, and by work-up. No indications for hospitalization identified.      Renne Crigler, PA-C 08/22/14 2328

## 2014-08-22 NOTE — ED Notes (Signed)
Pt states he is unable to void at this time, pt will let staff know when he is able to void.

## 2014-08-22 NOTE — Discharge Instructions (Signed)
Please read and follow all provided instructions.  Your diagnoses today include:  1. Gastritis    Tests performed today include:  Blood counts and electrolytes  Blood tests to check liver and kidney function  Blood tests to check pancreas function  Urine test to look for infection  Vital signs. See below for your results today.   Medications prescribed:   Pepcid (famotidine) - antihistamine  You can find this medication over-the-counter.   DO NOT exceed:   20mg  Pepcid every 12 hours   Zofran (ondansetron) - for nausea and vomiting  Take any prescribed medications only as directed.  Home care instructions:   Follow any educational materials contained in this packet.   Your abdominal pain, nausea, vomiting, and diarrhea may be caused by a viral gastroenteritis also called 'stomach flu'. You should rest for the next several days. Keep drinking plenty of fluids and use the medicine for nausea as directed.    Drink clear liquids for the next 24 hours and introduce solid foods slowly after 24 hours using the b.r.a.t. diet (Bananas, Rice, Applesauce, Toast, Yogurt).    Follow-up instructions: Please follow-up with your primary care provider in the next 2 days for further evaluation of your symptoms. If you are not feeling better in 48 hours you may have a condition that is more serious and you need re-evaluation.   Return instructions:  SEEK IMMEDIATE MEDICAL ATTENTION IF:  If you have pain that does not go away or becomes severe   A temperature above 101F develops   Repeated vomiting occurs (multiple episodes)   If you have pain that becomes localized to portions of the abdomen. The right side could possibly be appendicitis. In an adult, the left lower portion of the abdomen could be colitis or diverticulitis.   Blood is being passed in stools or vomit (bright red or black tarry stools)   You develop chest pain, difficulty breathing, dizziness or fainting, or become  confused, poorly responsive, or inconsolable (young children)  If you have any other emergent concerns regarding your health  Additional Information: Abdominal (belly) pain can be caused by many things. Your caregiver performed an examination and possibly ordered blood/urine tests and imaging (CT scan, x-rays, ultrasound). Many cases can be observed and treated at home after initial evaluation in the emergency department. Even though you are being discharged home, abdominal pain can be unpredictable. Therefore, you need a repeated exam if your pain does not resolve, returns, or worsens. Most patients with abdominal pain don't have to be admitted to the hospital or have surgery, but serious problems like appendicitis and gallbladder attacks can start out as nonspecific pain. Many abdominal conditions cannot be diagnosed in one visit, so follow-up evaluations are very important.  Your vital signs today were: BP 195/91   Pulse 68   Temp(Src) 98.2 F (36.8 C) (Oral)   Resp 18   Wt 180 lb (81.647 kg)   SpO2 98% If your blood pressure (bp) was elevated above 135/85 this visit, please have this repeated by your doctor within one month. --------------

## 2014-08-22 NOTE — ED Notes (Signed)
Pt arrived to the ED with a complaint of recurrent abdominal pain.  Pt states pain occurs monthly.  Pt has had emesis and diarrhea all day.  Pt states pain is central abdomen and is throbbing.

## 2014-08-31 NOTE — ED Provider Notes (Signed)
Medical screening examination/treatment/procedure(s) were performed by non-physician practitioner and as supervising physician I was immediately available for consultation/collaboration.   EKG Interpretation None       Hurman Horn, MD 08/31/14 [phone]

## 2014-09-19 ENCOUNTER — Emergency Department (HOSPITAL_COMMUNITY)
Admission: EM | Admit: 2014-09-19 | Discharge: 2014-09-20 | Discharge status: Against Medical Advice | Payer: Medicaid Other | Attending: Emergency Medicine | Admitting: Emergency Medicine

## 2014-09-19 DIAGNOSIS — F17200 Nicotine dependence, unspecified, uncomplicated: Secondary | ICD-10-CM | POA: Insufficient documentation

## 2014-09-19 DIAGNOSIS — M8618 Other acute osteomyelitis, other site: Secondary | ICD-10-CM | POA: Insufficient documentation

## 2014-09-19 DIAGNOSIS — E119 Type 2 diabetes mellitus without complications: Secondary | ICD-10-CM | POA: Diagnosis not present

## 2014-09-19 DIAGNOSIS — I10 Essential (primary) hypertension: Secondary | ICD-10-CM | POA: Insufficient documentation

## 2014-09-20 ENCOUNTER — Encounter (HOSPITAL_COMMUNITY): Payer: Self-pay | Admitting: Emergency Medicine

## 2014-09-20 NOTE — ED Notes (Signed)
Pt states that he has had an abscess to his sacral area x 3 weeks; pt states that it is sore when he sits; wife reports clear to purulent drainage at times

## 2014-09-22 ENCOUNTER — Emergency Department (HOSPITAL_COMMUNITY): Payer: Medicaid Other

## 2014-09-22 ENCOUNTER — Emergency Department (HOSPITAL_COMMUNITY)
Admission: EM | Admit: 2014-09-22 | Discharge: 2014-09-22 | Disposition: A | Discharge status: Home / Self Care | Payer: Medicaid Other | Attending: Emergency Medicine | Admitting: Emergency Medicine

## 2014-09-22 DIAGNOSIS — R3129 Other microscopic hematuria: Secondary | ICD-10-CM

## 2014-09-22 DIAGNOSIS — R079 Chest pain, unspecified: Secondary | ICD-10-CM | POA: Insufficient documentation

## 2014-09-22 DIAGNOSIS — Z79899 Other long term (current) drug therapy: Secondary | ICD-10-CM | POA: Insufficient documentation

## 2014-09-22 DIAGNOSIS — I10 Essential (primary) hypertension: Secondary | ICD-10-CM

## 2014-09-22 DIAGNOSIS — R197 Diarrhea, unspecified: Secondary | ICD-10-CM | POA: Insufficient documentation

## 2014-09-22 DIAGNOSIS — R112 Nausea with vomiting, unspecified: Secondary | ICD-10-CM | POA: Insufficient documentation

## 2014-09-22 DIAGNOSIS — E119 Type 2 diabetes mellitus without complications: Secondary | ICD-10-CM | POA: Insufficient documentation

## 2014-09-22 DIAGNOSIS — R1084 Generalized abdominal pain: Secondary | ICD-10-CM

## 2014-09-22 DIAGNOSIS — Z8719 Personal history of other diseases of the digestive system: Secondary | ICD-10-CM | POA: Diagnosis not present

## 2014-09-22 DIAGNOSIS — F17200 Nicotine dependence, unspecified, uncomplicated: Secondary | ICD-10-CM | POA: Insufficient documentation

## 2014-09-22 LAB — COMPREHENSIVE METABOLIC PANEL
ALBUMIN: 4.2 g/dL (ref 3.5–5.2)
ALT: 13 U/L (ref 0–53)
ANION GAP: 15 (ref 5–15)
AST: 15 U/L (ref 0–37)
Alkaline Phosphatase: 87 U/L (ref 39–117)
BUN: 14 mg/dL (ref 6–23)
CALCIUM: 9.4 mg/dL (ref 8.4–10.5)
CHLORIDE: 101 meq/L (ref 96–112)
CO2: 22 mEq/L (ref 19–32)
CREATININE: 1.14 mg/dL (ref 0.50–1.35)
GFR calc Af Amer: 85 mL/min — ABNORMAL LOW (ref >90)
GFR calc non Af Amer: 73 mL/min — ABNORMAL LOW (ref >90)
Glucose, Bld: 209 mg/dL — ABNORMAL HIGH (ref 70–99)
Potassium: 4.1 mEq/L (ref 3.7–5.3)
Sodium: 138 mEq/L (ref 137–147)
TOTAL PROTEIN: 8.1 g/dL (ref 6.0–8.3)
Total Bilirubin: 0.4 mg/dL (ref 0.3–1.2)

## 2014-09-22 LAB — CBC WITH DIFFERENTIAL/PLATELET
Basophils Absolute: 0 10*3/uL (ref 0.0–0.1)
Basophils Relative: 0 % (ref 0–1)
EOS PCT: 1 % (ref 0–5)
Eosinophils Absolute: 0.1 10*3/uL (ref 0.0–0.7)
HEMATOCRIT: 46.5 % (ref 39.0–52.0)
HEMOGLOBIN: 16 g/dL (ref 13.0–17.0)
LYMPHS ABS: 1.7 10*3/uL (ref 0.7–4.0)
Lymphocytes Relative: 22 % (ref 12–46)
MCH: 31.3 pg (ref 26.0–34.0)
MCHC: 34.4 g/dL (ref 30.0–36.0)
MCV: 90.8 fL (ref 78.0–100.0)
MONO ABS: 0.7 10*3/uL (ref 0.1–1.0)
Monocytes Relative: 9 % (ref 3–12)
Neutro Abs: 5.5 10*3/uL (ref 1.7–7.7)
Neutrophils Relative %: 68 % (ref 43–77)
Platelets: 218 10*3/uL (ref 150–400)
RBC: 5.12 MIL/uL (ref 4.22–5.81)
RDW: 13.2 % (ref 11.5–15.5)
WBC: 8 10*3/uL (ref 4.0–10.5)

## 2014-09-22 LAB — URINALYSIS, ROUTINE W REFLEX MICROSCOPIC
BILIRUBIN URINE: NEGATIVE
Glucose, UA: 250 mg/dL — AB
KETONES UR: 40 mg/dL — AB
LEUKOCYTES UA: NEGATIVE
Nitrite: NEGATIVE
PH: 5.5 (ref 5.0–8.0)
Protein, ur: 100 mg/dL — AB
Specific Gravity, Urine: 1.029 (ref 1.005–1.030)
Urobilinogen, UA: 0.2 mg/dL (ref 0.0–1.0)

## 2014-09-22 LAB — CBG MONITORING, ED: Glucose-Capillary: 230 mg/dL — ABNORMAL HIGH (ref 70–99)

## 2014-09-22 LAB — URINE MICROSCOPIC-ADD ON

## 2014-09-22 LAB — TROPONIN I: Troponin I: <0.3 ng/mL (ref <0.30)

## 2014-09-22 LAB — LIPASE, BLOOD: LIPASE: 17 U/L (ref 11–59)

## 2014-09-22 MED ORDER — HYDROMORPHONE HCL 1 MG/ML IJ SOLN
1.0000 mg | Freq: Once | INTRAMUSCULAR | Status: AC
  Administered 2014-09-22: 1 mg via INTRAVENOUS
  Filled 2014-09-22: qty 1

## 2014-09-22 MED ORDER — SODIUM CHLORIDE 0.9 % IV BOLUS (SEPSIS)
1000.0000 mL | Freq: Once | INTRAVENOUS | Status: AC
  Administered 2014-09-22: 1000 mL via INTRAVENOUS

## 2014-09-22 MED ORDER — OXYCODONE-ACETAMINOPHEN 5-325 MG PO TABS: 1.0000 | ORAL_TABLET | ORAL | Status: DC | PRN

## 2014-09-22 MED ORDER — ONDANSETRON HCL 4 MG/2ML IJ SOLN
4.0000 mg | Freq: Once | INTRAMUSCULAR | Status: AC
  Administered 2014-09-22: 4 mg via INTRAVENOUS
  Filled 2014-09-22: qty 2

## 2014-09-22 MED ORDER — LISINOPRIL 20 MG PO TABS
20.0000 mg | ORAL_TABLET | Freq: Once | ORAL | Status: AC
  Administered 2014-09-22: 20 mg via ORAL
  Filled 2014-09-22: qty 1

## 2014-09-22 MED ORDER — MORPHINE SULFATE 4 MG/ML IJ SOLN
4.0000 mg | Freq: Once | INTRAMUSCULAR | Status: AC
  Administered 2014-09-22: 4 mg via INTRAVENOUS
  Filled 2014-09-22: qty 1

## 2014-09-22 MED ORDER — PANTOPRAZOLE SODIUM 40 MG PO TBEC: 40.0000 mg | DELAYED_RELEASE_TABLET | Freq: Every day | ORAL | Status: DC

## 2014-09-22 MED ORDER — PROMETHAZINE HCL 25 MG/ML IJ SOLN
25.0000 mg | Freq: Once | INTRAMUSCULAR | Status: AC
  Administered 2014-09-22: 25 mg via INTRAVENOUS
  Filled 2014-09-22: qty 1

## 2014-09-22 MED ORDER — ONDANSETRON 4 MG PO TBDP: ORAL_TABLET | ORAL | Status: DC

## 2014-09-22 MED ORDER — LISINOPRIL 10 MG PO TABS: 10.0000 mg | ORAL_TABLET | Freq: Every day | ORAL | Status: DC

## 2014-09-22 NOTE — ED Notes (Signed)
Pt unable to provide urine specimen at this time. Pt verbalized understanding of attempting to provide urine specimen.

## 2014-09-22 NOTE — Discharge Instructions (Signed)
Abdominal Pain Many things can cause abdominal pain. Usually, abdominal pain is not caused by a disease and will improve without treatment. It can often be observed and treated at home. Your health care provider will do a physical exam and possibly order blood tests and X-rays to help determine the seriousness of your pain. However, in many cases, more time must pass before a clear cause of the pain can be found. Before that point, your health care provider may not know if you need more testing or further treatment. HOME CARE INSTRUCTIONS  Monitor your abdominal pain for any changes. The following actions may help to alleviate any discomfort you are experiencing:  Only take over-the-counter or prescription medicines as directed by your health care provider.  Do not take laxatives unless directed to do so by your health care provider.  Try a clear liquid diet (broth, tea, or water) as directed by your health care provider. Slowly move to a bland diet as tolerated. SEEK MEDICAL CARE IF:  You have unexplained abdominal pain.  You have abdominal pain associated with nausea or diarrhea.  You have pain when you urinate or have a bowel movement.  You experience abdominal pain that wakes you in the night.  You have abdominal pain that is worsened or improved by eating food.  You have abdominal pain that is worsened with eating fatty foods.  You have a fever. SEEK IMMEDIATE MEDICAL CARE IF:   Your pain does not go away within 2 hours.  You keep throwing up (vomiting).  Your pain is felt only in portions of the abdomen, such as the right side or the left lower portion of the abdomen.  You pass bloody or black tarry stools. MAKE SURE YOU:  Understand these instructions.   Will watch your condition.   Will get help right away if you are not doing well or get worse.  Document Released: 09/26/2005 Document Revised: 12/22/2013 Document Reviewed: 08/26/2013 [facility] Patient Information  2015 [place], [place]. This information is not intended to replace advice given to you by your health care provider. Make sure you discuss any questions you have with your health care provider.     Hematuria Hematuria is blood in your urine. It can be caused by a bladder infection, kidney infection, prostate infection, kidney stone, or cancer of your urinary tract. Infections can usually be treated with medicine, and a kidney stone usually will pass through your urine. If neither of these is the cause of your hematuria, further workup to find out the reason may be needed. It is very important that you tell your health care provider about any blood you see in your urine, even if the blood stops without treatment or happens without causing pain. Blood in your urine that happens and then stops and then happens again can be a symptom of a very serious condition. Also, pain is not a symptom in the initial stages of many urinary cancers. HOME CARE INSTRUCTIONS   Drink lots of fluid, 3-4 quarts a day. If you have been diagnosed with an infection, cranberry juice is especially recommended, in addition to large amounts of water.  Avoid caffeine, tea, and carbonated beverages because they tend to irritate the bladder.  Avoid alcohol because it may irritate the prostate.  Take all medicines as directed by your health care provider.  If you were prescribed an antibiotic medicine, finish it all even if you start to feel better.  If you have been diagnosed with a kidney stone,  follow your health care provider's instructions regarding straining your urine to catch the stone.  Empty your bladder often. Avoid holding urine for long periods of time.  After a bowel movement, women should cleanse front to back. Use each tissue only once.  Empty your bladder before and after sexual intercourse if you are a male. SEEK MEDICAL CARE IF:  You develop back pain.  You have a fever.  You have a feeling of  sickness in your stomach (nausea) or vomiting.  Your symptoms are not better in 3 days. Return sooner if you are getting worse. SEEK IMMEDIATE MEDICAL CARE IF:   You develop severe vomiting and are unable to keep the medicine down.  You develop severe back or abdominal pain despite taking your medicines.  You begin passing a large amount of blood or clots in your urine.  You feel extremely weak or faint, or you pass out. MAKE SURE YOU:   Understand these instructions.  Will watch your condition.  Will get help right away if you are not doing well or get worse. Document Released: 12/17/2005 Document Revised: 05/03/2014 Document Reviewed: 08/17/2013 [facility] Patient Information 2015 [place], [place]. This information is not intended to replace advice given to you by your health care provider. Make sure you discuss any questions you have with your health care provider.    Hypertension Hypertension, commonly called high blood pressure, is when the force of blood pumping through your arteries is too strong. Your arteries are the blood vessels that carry blood from your heart throughout your body. A blood pressure reading consists of a higher number over a lower number, such as 110/72. The higher number (systolic) is the pressure inside your arteries when your heart pumps. The lower number (diastolic) is the pressure inside your arteries when your heart relaxes. Ideally you want your blood pressure below 120/80. Hypertension forces your heart to work harder to pump blood. Your arteries may become narrow or stiff. Having hypertension puts you at risk for heart disease, stroke, and other problems.  RISK FACTORS Some risk factors for high blood pressure are controllable. Others are not.  Risk factors you cannot control include:   Race. You may be at higher risk if you are African American.  Age. Risk increases with age.  Gender. Men are at higher risk than women before age 48 years. After  age 54, women are at higher risk than men. Risk factors you can control include:  Not getting enough exercise or physical activity.  Being overweight.  Getting too much fat, sugar, calories, or salt in your diet.  Drinking too much alcohol. SIGNS AND SYMPTOMS Hypertension does not usually cause signs or symptoms. Extremely high blood pressure (hypertensive crisis) may cause headache, anxiety, shortness of breath, and nosebleed. DIAGNOSIS  To check if you have hypertension, your health care provider will measure your blood pressure while you are seated, with your arm held at the level of your heart. It should be measured at least twice using the same arm. Certain conditions can cause a difference in blood pressure between your right and left arms. A blood pressure reading that is higher than normal on one occasion does not mean that you need treatment. If one blood pressure reading is high, ask your health care provider about having it checked again. TREATMENT  Treating high blood pressure includes making lifestyle changes and possibly taking medicine. Living a healthy lifestyle can help lower high blood pressure. You may need to change some of your  habits. Lifestyle changes may include:  Following the DASH diet. This diet is high in fruits, vegetables, and whole grains. It is low in salt, red meat, and added sugars.  Getting at least 2 hours of brisk physical activity every week.  Losing weight if necessary.  Not smoking.  Limiting alcoholic beverages.  Learning ways to reduce stress. If lifestyle changes are not enough to get your blood pressure under control, your health care provider may prescribe medicine. You may need to take more than one. Work closely with your health care provider to understand the risks and benefits. HOME CARE INSTRUCTIONS  Have your blood pressure rechecked as directed by your health care provider.   Take medicines only as directed by your health care  provider. Follow the directions carefully. Blood pressure medicines must be taken as prescribed. The medicine does not work as well when you skip doses. Skipping doses also puts you at risk for problems.   Do not smoke.   Monitor your blood pressure at home as directed by your health care provider. SEEK MEDICAL CARE IF:   You think you are having a reaction to medicines taken.  You have recurrent headaches or feel dizzy.  You have swelling in your ankles.  You have trouble with your vision. SEEK IMMEDIATE MEDICAL CARE IF:  You develop a severe headache or confusion.  You have unusual weakness, numbness, or feel faint.  You have severe chest or abdominal pain.  You vomit repeatedly.  You have trouble breathing. MAKE SURE YOU:   Understand these instructions.  Will watch your condition.  Will get help right away if you are not doing well or get worse. Document Released: 12/17/2005 Document Revised: 05/03/2014 Document Reviewed: 10/09/2013 [facility] Patient Information 2015 [place], [place]. This information is not intended to replace advice given to you by your health care provider. Make sure you discuss any questions you have with your health care provider.

## 2014-09-22 NOTE — ED Notes (Signed)
Pt unable to void at present time.

## 2014-09-22 NOTE — ED Notes (Signed)
Pt reports 10/10 generalized abdominal pain with n/v/d starting this am. Pt reports same event a month ago; seen; told to FU with GI; pt reports has not followed up. Pt reports central CP while waiting; pt denies CP or SOB at present time.

## 2014-09-22 NOTE — ED Notes (Signed)
Pt reports abdominal pain 10/10 this morning, vomited x6 since this am.

## 2014-09-22 NOTE — ED Provider Notes (Signed)
CSN: [id number]     Arrival date & time 09/22/14  1301 History   First MD Initiated Contact with Patient 09/22/14 1501     Chief Complaint  Patient presents with  . Abdominal Pain  . Emesis     (Consider location/radiation/quality/duration/timing/severity/associated sxs/prior Treatment) HPI The 49 year old male presents with abdominal pain since 6 AM this morning. He states that this pain is similar to the multiple times his abdominal pain over the past several months. He was referred to GI but states he was too busy to followup. He states this is just like his "acid reflux". Has not taken anything for the pain. Denies any fevers, blood in the stool, urinary symptoms, or back pain. States while he was waiting to be brought back to room he started having chest pain for about 5 minutes. Is unable to describe what the pain felt like. He thinks it was coming from his abdomen. He points to his entire abdomen as the location of his pain. Has had multiple loose bowel movements today as well. He's been CT scanned for similar complaints a couple months ago. States the only thing that helped his pain is Dilaudid.  Past Medical History  Diagnosis Date  . Diabetes mellitus   . Hypertension   . GERD (gastroesophageal reflux disease)    Past Surgical History  Procedure Laterality Date  . Wisdom tooth extraction      bottom LT, top RT   No family history on file. History  Substance Use Topics  . Smoking status: Current Every Day Smoker -- 0.50 packs/day    Types: Cigarettes  . Smokeless tobacco: Not on file  . Alcohol Use: Yes     Comment: very occasionally    Review of Systems  Constitutional: Negative for fever.  Respiratory: Negative for shortness of breath.   Cardiovascular: Positive for chest pain.  Gastrointestinal: Positive for nausea, vomiting, abdominal pain and diarrhea. Negative for blood in stool.  Genitourinary: Negative for dysuria.  Musculoskeletal: Negative for back pain.   All other systems reviewed and are negative.     Allergies  Aspirin and Other  Home Medications   Prior to Admission medications   Medication Sig Start Date End Date Taking? Authorizing Provider  metFORMIN (GLUCOPHAGE) 500 MG tablet Take 500 mg by mouth 2 (two) times daily with a meal.   Yes Historical Provider, MD   BP 204/98  Pulse 64  Temp(Src) 97.8 F (36.6 C) (Oral)  Resp 18  SpO2 99% Physical Exam  Nursing note and vitals reviewed. Constitutional: He is oriented to person, place, and time. He appears well-developed and well-nourished.  HENT:  Head: Normocephalic and atraumatic.  Right Ear: External ear normal.  Left Ear: External ear normal.  Nose: Nose normal.  Eyes: Right eye exhibits no discharge. Left eye exhibits no discharge.  Neck: Neck supple.  Cardiovascular: Normal rate, regular rhythm, normal heart sounds and intact distal pulses.   Pulmonary/Chest: Effort normal and breath sounds normal.  Abdominal: Soft. There is generalized tenderness.  Neurological: He is alert and oriented to person, place, and time.  Skin: Skin is warm and dry. He is not diaphoretic.    ED Course  Procedures (including critical care time) Labs Review Labs Reviewed  COMPREHENSIVE METABOLIC PANEL - Abnormal; Notable for the following:    Glucose, Bld 209 (*)    GFR calc non Af Amer 73 (*)    GFR calc Af Amer 85 (*)    All other components within normal limits  CBG MONITORING, ED - Abnormal; Notable for the following:    Glucose-Capillary 230 (*)    All other components within normal limits  LIPASE, BLOOD  CBC WITH DIFFERENTIAL  TROPONIN I  URINALYSIS, ROUTINE W REFLEX MICROSCOPIC    Imaging Review No results found.   EKG Interpretation   Date/Time:  Wednesday September 22 2014 13:53:55 EDT Ventricular Rate:  58 PR Interval:  138 QRS Duration: 106 QT Interval:  414 QTC Calculation: 406 R Axis:   79 Text Interpretation:  Sinus bradycardia anterior T waves  improved from  June 2015 Confirmed by Criss Alvine  MD, Piccola Arico ([id number]) on 09/22/2014 3:01:59 PM      MDM   Final diagnoses:  Generalized abdominal pain  Essential hypertension  Hematuria, microscopic    Patient's abdomen is soft, no signs of peritonitis. Given this is recurrent (states he gets this approx 1 times per month over past 5 months) I do not feel he needs repeat imaging. Pain treated, given fluids and anti-emetics. I do not feel his CP is ACS, PE or dissection, likely more related to his abd pain. Has been diagnosed with gastritis in past. States protonix did help in the past, will refill this along with short course of pain medicine. Patient is noted to be hypertensive here, as he has been multiple times in the past. Is not on anything for her hypertension, will start on lisinopril given his diabetes. He is noted to have microscopic hematuria that has been seen in the past as well. We'll refer to urology. Given that he has no localized pain, back pain, or colicky pain I have low suspicion for renal disease and do not feel he needs CT imaging at this time.    Audree Camel, MD 09/23/14 0001

## 2014-09-22 NOTE — ED Notes (Signed)
Attempted to administer lisinopril PO, pt states "I will just throw it up." Pt requesting pain and nausea med before taking blood pressure med PO. Criss Alvine, MD notified. Will attempt PO med after pain and nausea med administered.

## 2014-09-24 ENCOUNTER — Encounter (HOSPITAL_COMMUNITY): Payer: Self-pay | Admitting: Emergency Medicine

## 2014-09-24 ENCOUNTER — Emergency Department (HOSPITAL_COMMUNITY)
Admission: EM | Admit: 2014-09-24 | Discharge: 2014-09-25 | Disposition: A | Discharge status: Home / Self Care | Payer: Medicaid Other | Attending: Emergency Medicine | Admitting: Emergency Medicine

## 2014-09-24 DIAGNOSIS — R112 Nausea with vomiting, unspecified: Secondary | ICD-10-CM | POA: Diagnosis not present

## 2014-09-24 DIAGNOSIS — E119 Type 2 diabetes mellitus without complications: Secondary | ICD-10-CM | POA: Insufficient documentation

## 2014-09-24 DIAGNOSIS — F17200 Nicotine dependence, unspecified, uncomplicated: Secondary | ICD-10-CM | POA: Diagnosis not present

## 2014-09-24 DIAGNOSIS — R109 Unspecified abdominal pain: Secondary | ICD-10-CM | POA: Diagnosis not present

## 2014-09-24 DIAGNOSIS — Z79899 Other long term (current) drug therapy: Secondary | ICD-10-CM | POA: Diagnosis not present

## 2014-09-24 DIAGNOSIS — K219 Gastro-esophageal reflux disease without esophagitis: Secondary | ICD-10-CM | POA: Diagnosis not present

## 2014-09-24 DIAGNOSIS — I10 Essential (primary) hypertension: Secondary | ICD-10-CM | POA: Insufficient documentation

## 2014-09-24 LAB — RAPID URINE DRUG SCREEN, HOSP PERFORMED
Amphetamines: NOT DETECTED
Barbiturates: NOT DETECTED
Benzodiazepines: NOT DETECTED
Cocaine: NOT DETECTED
Opiates: NOT DETECTED
Tetrahydrocannabinol: POSITIVE — AB

## 2014-09-24 LAB — CBC WITH DIFFERENTIAL/PLATELET
Basophils Absolute: 0 10*3/uL (ref 0.0–0.1)
Basophils Relative: 0 % (ref 0–1)
Eosinophils Absolute: 0 10*3/uL (ref 0.0–0.7)
Eosinophils Relative: 0 % (ref 0–5)
HCT: 46.5 % (ref 39.0–52.0)
Hemoglobin: 16.5 g/dL (ref 13.0–17.0)
Lymphocytes Relative: 15 % (ref 12–46)
Lymphs Abs: 1.2 10*3/uL (ref 0.7–4.0)
MCH: 32.2 pg (ref 26.0–34.0)
MCHC: 35.5 g/dL (ref 30.0–36.0)
MCV: 90.8 fL (ref 78.0–100.0)
Monocytes Absolute: 0.5 10*3/uL (ref 0.1–1.0)
Monocytes Relative: 6 % (ref 3–12)
Neutro Abs: 6.1 10*3/uL (ref 1.7–7.7)
Neutrophils Relative %: 79 % — ABNORMAL HIGH (ref 43–77)
Platelets: 224 10*3/uL (ref 150–400)
RBC: 5.12 MIL/uL (ref 4.22–5.81)
RDW: 13.1 % (ref 11.5–15.5)
WBC: 7.8 10*3/uL (ref 4.0–10.5)

## 2014-09-24 LAB — COMPREHENSIVE METABOLIC PANEL
ALT: 14 U/L (ref 0–53)
AST: 26 U/L (ref 0–37)
Albumin: 4.5 g/dL (ref 3.5–5.2)
Alkaline Phosphatase: 84 U/L (ref 39–117)
Anion gap: 17 — ABNORMAL HIGH (ref 5–15)
BUN: 11 mg/dL (ref 6–23)
CO2: 22 mEq/L (ref 19–32)
Calcium: 9.5 mg/dL (ref 8.4–10.5)
Chloride: 100 mEq/L (ref 96–112)
Creatinine, Ser: 1.11 mg/dL (ref 0.50–1.35)
GFR calc Af Amer: 88 mL/min — ABNORMAL LOW (ref >90)
GFR calc non Af Amer: 76 mL/min — ABNORMAL LOW (ref >90)
Glucose, Bld: 162 mg/dL — ABNORMAL HIGH (ref 70–99)
Potassium: 3.9 mEq/L (ref 3.7–5.3)
Sodium: 139 mEq/L (ref 137–147)
Total Bilirubin: 0.7 mg/dL (ref 0.3–1.2)
Total Protein: 8.3 g/dL (ref 6.0–8.3)

## 2014-09-24 LAB — URINALYSIS, ROUTINE W REFLEX MICROSCOPIC
Bilirubin Urine: NEGATIVE
Glucose, UA: NEGATIVE mg/dL
Ketones, ur: 40 mg/dL — AB
Leukocytes, UA: NEGATIVE
Nitrite: NEGATIVE
Protein, ur: 100 mg/dL — AB
Specific Gravity, Urine: 1.025 (ref 1.005–1.030)
Urobilinogen, UA: 0.2 mg/dL (ref 0.0–1.0)
pH: 6.5 (ref 5.0–8.0)

## 2014-09-24 LAB — LIPASE, BLOOD: Lipase: 19 U/L (ref 11–59)

## 2014-09-24 LAB — URINE MICROSCOPIC-ADD ON

## 2014-09-24 LAB — TROPONIN I: Troponin I: <0.3 ng/mL (ref <0.30)

## 2014-09-24 MED ORDER — HYDRALAZINE HCL 20 MG/ML IJ SOLN
10.0000 mg | Freq: Once | INTRAMUSCULAR | Status: AC
  Administered 2014-09-24: 10 mg via INTRAVENOUS
  Filled 2014-09-24: qty 1

## 2014-09-24 MED ORDER — ONDANSETRON HCL 4 MG/2ML IJ SOLN
4.0000 mg | Freq: Once | INTRAMUSCULAR | Status: AC
  Administered 2014-09-24: 4 mg via INTRAVENOUS
  Filled 2014-09-24: qty 2

## 2014-09-24 MED ORDER — HYDROMORPHONE HCL 1 MG/ML IJ SOLN
1.0000 mg | Freq: Once | INTRAMUSCULAR | Status: AC
Start: 2014-09-24 — End: 2014-09-24
  Administered 2014-09-24: 1 mg via INTRAVENOUS
  Filled 2014-09-24: qty 1

## 2014-09-24 MED ORDER — HYDROMORPHONE HCL 2 MG/ML IJ SOLN
1.5000 mg | Freq: Once | INTRAMUSCULAR | Status: AC
  Administered 2014-09-24: 1.5 mg via INTRAVENOUS
  Filled 2014-09-24: qty 1

## 2014-09-24 MED ORDER — PROMETHAZINE HCL 25 MG/ML IJ SOLN
12.5000 mg | Freq: Once | INTRAMUSCULAR | Status: AC
  Administered 2014-09-24: 12.5 mg via INTRAVENOUS
  Filled 2014-09-24: qty 1

## 2014-09-24 MED ORDER — SODIUM CHLORIDE 0.9 % IV BOLUS (SEPSIS)
1000.0000 mL | Freq: Once | INTRAVENOUS | Status: AC
  Administered 2014-09-24: 1000 mL via INTRAVENOUS

## 2014-09-24 NOTE — ED Notes (Signed)
Pt reports n/v since this afternoon with abd pain. Here for same two days ago, was given medications here and a prescription. Was fine yesterday. Pt states pain is generalized. Pt actively vomiting in triage.

## 2014-09-25 MED ORDER — PROMETHAZINE HCL 25 MG PO TABS: 25.0000 mg | ORAL_TABLET | Freq: Three times a day (TID) | ORAL | Status: DC | PRN

## 2014-09-25 NOTE — Discharge Instructions (Signed)
Abdominal Pain °Many things can cause abdominal pain. Usually, abdominal pain is not caused by a disease and will improve without treatment. It can often be observed and treated at home. Your health care provider will do a physical exam and possibly order blood tests and X-rays to help determine the seriousness of your pain. However, in many cases, more time must pass before a clear cause of the pain can be found. Before that point, your health care provider may not know if you need more testing or further treatment. °HOME CARE INSTRUCTIONS  °Monitor your abdominal pain for any changes. The following actions may help to alleviate any discomfort you are experiencing: °· Only take over-the-counter or prescription medicines as directed by your health care provider. °· Do not take laxatives unless directed to do so by your health care provider. °· Try a clear liquid diet (broth, tea, or water) as directed by your health care provider. Slowly move to a bland diet as tolerated. °SEEK MEDICAL CARE IF: °· You have unexplained abdominal pain. °· You have abdominal pain associated with nausea or diarrhea. °· You have pain when you urinate or have a bowel movement. °· You experience abdominal pain that wakes you in the night. °· You have abdominal pain that is worsened or improved by eating food. °· You have abdominal pain that is worsened with eating fatty foods. °· You have a fever. °SEEK IMMEDIATE MEDICAL CARE IF:  °· Your pain does not go away within 2 hours. °· You keep throwing up (vomiting). °· Your pain is felt only in portions of the abdomen, such as the right side or the left lower portion of the abdomen. °· You pass bloody or black tarry stools. °MAKE SURE YOU: °· Understand these instructions.   °· Will watch your condition.   °· Will get help right away if you are not doing well or get worse.   °Document Released: 09/26/2005 Document Revised: 12/22/2013 Document Reviewed: 08/26/2013 °[employer]® Patient Information  ©2015 [employer]. This information is not intended to replace advice given to you by your health care provider. Make sure you discuss any questions you have with your health care provider. ° °Nausea and Vomiting °Nausea means you feel sick to your stomach. Throwing up (vomiting) is a reflex where stomach contents come out of your mouth. °HOME CARE  °· Take medicine as told by your doctor. °· Do not force yourself to eat. However, you do need to drink fluids. °· If you feel like eating, eat a normal diet as told by your doctor. °¨ Eat rice, wheat, potatoes, bread, lean meats, yogurt, fruits, and vegetables. °¨ Avoid high-fat foods. °· Drink enough fluids to keep your pee (urine) clear or pale yellow. °· Ask your doctor how to replace body fluid losses (rehydrate). Signs of body fluid loss (dehydration) include: °¨ Feeling very thirsty. °¨ Dry lips and mouth. °¨ Feeling dizzy. °¨ Dark pee. °¨ Peeing less than normal. °¨ Feeling confused. °¨ Fast breathing or heart rate. °GET HELP RIGHT AWAY IF:  °· You have blood in your throw up. °· You have black or bloody poop (stool). °· You have a bad headache or stiff neck. °· You feel confused. °· You have bad belly (abdominal) pain. °· You have chest pain or trouble breathing. °· You do not pee at least once every 8 hours. °· You have cold, clammy skin. °· You keep throwing up after 24 to 48 hours. °· You have a fever. °MAKE SURE YOU:  °· Understand   these instructions. °· Will watch your condition. °· Will get help right away if you are not doing well or get worse. °Document Released: 06/04/2008 Document Revised: 03/10/2012 Document Reviewed: 05/18/2011 °[employer]® Patient Information ©2015 [employer]. This information is not intended to replace advice given to you by your health care provider. Make sure you discuss any questions you have with your health care provider. ° °

## 2014-09-30 NOTE — ED Provider Notes (Signed)
CSN: [id number]     Arrival date & time 09/24/14  1728 History   First MD Initiated Contact with Patient 09/24/14 1902     Chief Complaint  Patient presents with  . Emesis  . Abdominal Pain  . Hypertension     (Consider location/radiation/quality/duration/timing/severity/associated sxs/prior Treatment) HPI  49 year old male with abdominal pain and nausea/vomiting. Patient has been seen in the emergency room for similar symptoms. He states that they returned this afternoon. Gradual onset and progressively worsening. The abdominal pain does not particularly localize. No appreciable exacerbating relieving factors. Nausea and multiple episodes of nonbilious/nonbloody emesis. He does not feel like he is bloated. No acute urinary complaints. No diarrhea. No sick contacts. Patient has been previously told to followup with PCP or gastroenterology but he has yet to do this.  Past Medical History  Diagnosis Date  . Diabetes mellitus   . Hypertension   . GERD (gastroesophageal reflux disease)    Past Surgical History  Procedure Laterality Date  . Wisdom tooth extraction      bottom LT, top RT   History reviewed. No pertinent family history. History  Substance Use Topics  . Smoking status: Current Every Day Smoker -- 0.50 packs/day    Types: Cigarettes  . Smokeless tobacco: Not on file  . Alcohol Use: Yes     Comment: very occasionally    Review of Systems  All systems reviewed and negative, other than as noted in HPI.   Allergies  Aspirin and Other  Home Medications   Prior to Admission medications   Medication Sig Start Date End Date Taking? Authorizing Provider  metFORMIN (GLUCOPHAGE) 500 MG tablet Take 500 mg by mouth 2 (two) times daily with a meal.   Yes Historical Provider, MD  ondansetron (ZOFRAN-ODT) 4 MG disintegrating tablet Take 4 mg by mouth every 4 (four) hours as needed for nausea or vomiting.   Yes Historical Provider, MD  oxyCODONE-acetaminophen (PERCOCET)  5-325 MG per tablet Take 1 tablet by mouth every 4 (four) hours as needed for severe pain. 09/22/14  Yes Audree Camel, MD  pantoprazole (PROTONIX) 40 MG tablet Take 1 tablet (40 mg total) by mouth daily. 09/22/14  Yes Audree Camel, MD  lisinopril (PRINIVIL,ZESTRIL) 10 MG tablet Take 1 tablet (10 mg total) by mouth daily. 09/22/14   Audree Camel, MD  promethazine (PHENERGAN) 25 MG tablet Take 1 tablet (25 mg total) by mouth every 8 (eight) hours as needed for nausea or vomiting. 09/25/14   Raeford Razor, MD   BP 136/90  Pulse 89  Temp(Src) 98.3 F (36.8 C) (Oral)  Resp 10  SpO2 98% Physical Exam  Nursing note and vitals reviewed. Constitutional: He appears well-developed and well-nourished.  Laying in bed. Appears uncomfortable, but not toxic.  HENT:  Head: Normocephalic and atraumatic.  Eyes: Conjunctivae are normal. Right eye exhibits no discharge. Left eye exhibits no discharge.  Neck: Neck supple.  Cardiovascular: Normal rate, regular rhythm and normal heart sounds.  Exam reveals no gallop and no friction rub.   No murmur heard. Pulmonary/Chest: Effort normal and breath sounds normal. No respiratory distress.  Abdominal: Soft. He exhibits no distension. There is no tenderness.  Genitourinary:  No CVA tenderness  Musculoskeletal: He exhibits no edema and no tenderness.  Neurological: He is alert.  Skin: Skin is warm and dry.  Psychiatric: He has a normal mood and affect. His behavior is normal. Thought content normal.    ED Course  Procedures (including critical care time)  Labs Review Labs Reviewed  CBC WITH DIFFERENTIAL - Abnormal; Notable for the following:    Neutrophils Relative % 79 (*)    All other components within normal limits  COMPREHENSIVE METABOLIC PANEL - Abnormal; Notable for the following:    Glucose, Bld 162 (*)    GFR calc non Af Amer 76 (*)    GFR calc Af Amer 88 (*)    Anion gap 17 (*)    All other components within normal limits  URINALYSIS,  ROUTINE W REFLEX MICROSCOPIC - Abnormal; Notable for the following:    APPearance TURBID (*)    Hgb urine dipstick MODERATE (*)    Ketones, ur 40 (*)    Protein, ur 100 (*)    All other components within normal limits  URINE RAPID DRUG SCREEN (HOSP PERFORMED) - Abnormal; Notable for the following:    Tetrahydrocannabinol POSITIVE (*)    All other components within normal limits  URINE MICROSCOPIC-ADD ON - Abnormal; Notable for the following:    Bacteria, UA FEW (*)    All other components within normal limits  LIPASE, BLOOD  TROPONIN I    Imaging Review No results found.   EKG Interpretation   Date/Time:  Friday September 24 2014 19:42:24 EDT Ventricular Rate:  66 PR Interval:  135 QRS Duration: 98 QT Interval:  400 QTC Calculation: 419 R Axis:   76 Text Interpretation:  Sinus rhythm Minimal ST depression, inferior leads  ED PHYSICIAN INTERPRETATION AVAILABLE IN [facility] Confirmed by  TEST, Record ([id number]) on 09/26/2014 8:43:28 AM      MDM   Final diagnoses:  Abdominal pain, unspecified abdominal location  Non-intractable vomiting with nausea, vomiting of unspecified type  Essential hypertension    49 year old male with abdominal pain and nausea/vomiting. Significantly improved after medications/IV fluids. Workup unremarkable aside from ketones on urinalysis and positive THC. Abdominal exam is benign. Significant hypertension which he says usually improves once in symptoms aren't controlled. Blood pressure at discharge would support this on this visit as well as prior one. Again encouraged to followup with gastroenterology. Return precautions discussed.    Raeford RazorStephen Zarya Lasseigne, MD 09/30/14 1250

## 2014-12-31 DIAGNOSIS — I2510 Atherosclerotic heart disease of native coronary artery without angina pectoris: Secondary | ICD-10-CM

## 2014-12-31 HISTORY — DX: Atherosclerotic heart disease of native coronary artery without angina pectoris: I25.10

## 2015-01-25 ENCOUNTER — Encounter (HOSPITAL_COMMUNITY): Payer: Self-pay | Admitting: Emergency Medicine

## 2015-01-25 ENCOUNTER — Emergency Department (HOSPITAL_COMMUNITY)
Admission: EM | Admit: 2015-01-25 | Discharge: 2015-01-25 | Discharge status: Against Medical Advice | Payer: Medicaid Other | Attending: Emergency Medicine | Admitting: Emergency Medicine

## 2015-01-25 DIAGNOSIS — R197 Diarrhea, unspecified: Secondary | ICD-10-CM | POA: Insufficient documentation

## 2015-01-25 DIAGNOSIS — Z720 Tobacco use: Secondary | ICD-10-CM | POA: Insufficient documentation

## 2015-01-25 DIAGNOSIS — I10 Essential (primary) hypertension: Secondary | ICD-10-CM | POA: Insufficient documentation

## 2015-01-25 DIAGNOSIS — R112 Nausea with vomiting, unspecified: Secondary | ICD-10-CM | POA: Insufficient documentation

## 2015-01-25 DIAGNOSIS — E119 Type 2 diabetes mellitus without complications: Secondary | ICD-10-CM | POA: Insufficient documentation

## 2015-01-25 DIAGNOSIS — R1030 Lower abdominal pain, unspecified: Secondary | ICD-10-CM | POA: Diagnosis present

## 2015-01-25 LAB — CBC WITH DIFFERENTIAL/PLATELET
Basophils Absolute: 0 10*3/uL (ref 0.0–0.1)
Basophils Relative: 0 % (ref 0–1)
Eosinophils Absolute: 0 10*3/uL (ref 0.0–0.7)
Eosinophils Relative: 0 % (ref 0–5)
HCT: 46.2 % (ref 39.0–52.0)
Hemoglobin: 15.9 g/dL (ref 13.0–17.0)
Lymphocytes Relative: 10 % — ABNORMAL LOW (ref 12–46)
Lymphs Abs: 1.1 10*3/uL (ref 0.7–4.0)
MCH: 31.5 pg (ref 26.0–34.0)
MCHC: 34.4 g/dL (ref 30.0–36.0)
MCV: 91.7 fL (ref 78.0–100.0)
MONOS PCT: 4 % (ref 3–12)
Monocytes Absolute: 0.4 10*3/uL (ref 0.1–1.0)
NEUTROS PCT: 86 % — AB (ref 43–77)
Neutro Abs: 9.3 10*3/uL — ABNORMAL HIGH (ref 1.7–7.7)
Platelets: 199 10*3/uL (ref 150–400)
RBC: 5.04 MIL/uL (ref 4.22–5.81)
RDW: 13.5 % (ref 11.5–15.5)
WBC: 10.8 10*3/uL — ABNORMAL HIGH (ref 4.0–10.5)

## 2015-01-25 LAB — LIPASE, BLOOD: LIPASE: 20 U/L (ref 11–59)

## 2015-01-25 LAB — CBG MONITORING, ED: Glucose-Capillary: 197 mg/dL — ABNORMAL HIGH (ref 70–99)

## 2015-01-25 LAB — COMPREHENSIVE METABOLIC PANEL
ALT: 26 U/L (ref 0–53)
AST: 24 U/L (ref 0–37)
Albumin: 4.7 g/dL (ref 3.5–5.2)
Alkaline Phosphatase: 92 U/L (ref 39–117)
Anion gap: 11 (ref 5–15)
BUN: 13 mg/dL (ref 6–23)
CALCIUM: 9.3 mg/dL (ref 8.4–10.5)
CO2: 25 mmol/L (ref 19–32)
Chloride: 102 mmol/L (ref 96–112)
Creatinine, Ser: 1.08 mg/dL (ref 0.50–1.35)
GFR calc Af Amer: >90 mL/min (ref >90)
GFR calc non Af Amer: 78 mL/min — ABNORMAL LOW (ref >90)
GLUCOSE: 226 mg/dL — AB (ref 70–99)
Potassium: 3.7 mmol/L (ref 3.5–5.1)
Sodium: 138 mmol/L (ref 135–145)
TOTAL PROTEIN: 8.2 g/dL (ref 6.0–8.3)
Total Bilirubin: 0.6 mg/dL (ref 0.3–1.2)

## 2015-01-25 MED ORDER — ONDANSETRON 8 MG PO TBDP
8.0000 mg | ORAL_TABLET | Freq: Once | ORAL | Status: AC
Start: 2015-01-25 — End: 2015-01-25
  Administered 2015-01-25: 8 mg via ORAL
  Filled 2015-01-25: qty 1

## 2015-01-25 NOTE — ED Notes (Signed)
Pt c/o low abdominal pain, nausea, vomiting, diarrhea.

## 2015-01-25 NOTE — ED Notes (Signed)
Pt stated to registration he was going to go home and lay down, pt moved off floor.

## 2015-05-02 ENCOUNTER — Emergency Department (HOSPITAL_COMMUNITY): Payer: Medicaid Other

## 2015-05-02 ENCOUNTER — Inpatient Hospital Stay (HOSPITAL_COMMUNITY)
Admission: EM | Admit: 2015-05-02 | Discharge: 2015-05-05 | DRG: 246 | Disposition: A | Discharge status: Home / Self Care | Payer: Medicaid Other | Attending: Internal Medicine | Admitting: Internal Medicine

## 2015-05-02 ENCOUNTER — Observation Stay (HOSPITAL_COMMUNITY): Payer: Medicaid Other

## 2015-05-02 ENCOUNTER — Encounter (HOSPITAL_COMMUNITY): Payer: Self-pay | Admitting: Emergency Medicine

## 2015-05-02 DIAGNOSIS — I2510 Atherosclerotic heart disease of native coronary artery without angina pectoris: Secondary | ICD-10-CM | POA: Diagnosis present

## 2015-05-02 DIAGNOSIS — E1159 Type 2 diabetes mellitus with other circulatory complications: Secondary | ICD-10-CM

## 2015-05-02 DIAGNOSIS — Z79899 Other long term (current) drug therapy: Secondary | ICD-10-CM

## 2015-05-02 DIAGNOSIS — I10 Essential (primary) hypertension: Secondary | ICD-10-CM | POA: Diagnosis not present

## 2015-05-02 DIAGNOSIS — Z9114 Patient's other noncompliance with medication regimen: Secondary | ICD-10-CM | POA: Diagnosis present

## 2015-05-02 DIAGNOSIS — E780 Pure hypercholesterolemia: Secondary | ICD-10-CM | POA: Diagnosis present

## 2015-05-02 DIAGNOSIS — Z886 Allergy status to analgesic agent status: Secondary | ICD-10-CM

## 2015-05-02 DIAGNOSIS — I509 Heart failure, unspecified: Secondary | ICD-10-CM | POA: Diagnosis not present

## 2015-05-02 DIAGNOSIS — I5043 Acute on chronic combined systolic (congestive) and diastolic (congestive) heart failure: Principal | ICD-10-CM | POA: Diagnosis present

## 2015-05-02 DIAGNOSIS — E785 Hyperlipidemia, unspecified: Secondary | ICD-10-CM | POA: Diagnosis present

## 2015-05-02 DIAGNOSIS — K219 Gastro-esophageal reflux disease without esophagitis: Secondary | ICD-10-CM | POA: Diagnosis present

## 2015-05-02 DIAGNOSIS — F17200 Nicotine dependence, unspecified, uncomplicated: Secondary | ICD-10-CM | POA: Diagnosis present

## 2015-05-02 DIAGNOSIS — R778 Other specified abnormalities of plasma proteins: Secondary | ICD-10-CM

## 2015-05-02 DIAGNOSIS — I214 Non-ST elevation (NSTEMI) myocardial infarction: Secondary | ICD-10-CM | POA: Diagnosis present

## 2015-05-02 DIAGNOSIS — R7989 Other specified abnormal findings of blood chemistry: Secondary | ICD-10-CM

## 2015-05-02 DIAGNOSIS — R112 Nausea with vomiting, unspecified: Secondary | ICD-10-CM

## 2015-05-02 DIAGNOSIS — I255 Ischemic cardiomyopathy: Secondary | ICD-10-CM | POA: Diagnosis present

## 2015-05-02 DIAGNOSIS — E1165 Type 2 diabetes mellitus with hyperglycemia: Secondary | ICD-10-CM | POA: Diagnosis present

## 2015-05-02 DIAGNOSIS — I5042 Chronic combined systolic (congestive) and diastolic (congestive) heart failure: Secondary | ICD-10-CM | POA: Insufficient documentation

## 2015-05-02 DIAGNOSIS — Z79891 Long term (current) use of opiate analgesic: Secondary | ICD-10-CM

## 2015-05-02 DIAGNOSIS — F17210 Nicotine dependence, cigarettes, uncomplicated: Secondary | ICD-10-CM | POA: Diagnosis present

## 2015-05-02 DIAGNOSIS — R9431 Abnormal electrocardiogram [ECG] [EKG]: Secondary | ICD-10-CM

## 2015-05-02 HISTORY — DX: Tobacco use: Z72.0

## 2015-05-02 HISTORY — DX: Hyperlipidemia, unspecified: E78.5

## 2015-05-02 HISTORY — DX: Atherosclerotic heart disease of native coronary artery without angina pectoris: I25.10

## 2015-05-02 HISTORY — DX: Ischemic cardiomyopathy: I25.5

## 2015-05-02 LAB — BASIC METABOLIC PANEL
ANION GAP: 5 (ref 5–15)
BUN: 13 mg/dL (ref 6–20)
CALCIUM: 8.6 mg/dL — AB (ref 8.9–10.3)
CHLORIDE: 108 mmol/L (ref 101–111)
CO2: 24 mmol/L (ref 22–32)
CREATININE: 1.27 mg/dL — AB (ref 0.61–1.24)
GFR calc Af Amer: >60 mL/min (ref >60)
GFR calc non Af Amer: >60 mL/min (ref >60)
GLUCOSE: 101 mg/dL — AB (ref 70–99)
POTASSIUM: 3.3 mmol/L — AB (ref 3.5–5.1)
SODIUM: 137 mmol/L (ref 135–145)

## 2015-05-02 LAB — CBC WITH DIFFERENTIAL/PLATELET
Basophils Absolute: 0 10*3/uL (ref 0.0–0.1)
Basophils Relative: 0 % (ref 0–1)
Eosinophils Absolute: 0.3 10*3/uL (ref 0.0–0.7)
Eosinophils Relative: 4 % (ref 0–5)
HEMATOCRIT: 37.9 % — AB (ref 39.0–52.0)
Hemoglobin: 12.6 g/dL — ABNORMAL LOW (ref 13.0–17.0)
LYMPHS ABS: 2.5 10*3/uL (ref 0.7–4.0)
Lymphocytes Relative: 38 % (ref 12–46)
MCH: 31 pg (ref 26.0–34.0)
MCHC: 33.2 g/dL (ref 30.0–36.0)
MCV: 93.1 fL (ref 78.0–100.0)
MONO ABS: 0.8 10*3/uL (ref 0.1–1.0)
Monocytes Relative: 12 % (ref 3–12)
Neutro Abs: 3 10*3/uL (ref 1.7–7.7)
Neutrophils Relative %: 46 % (ref 43–77)
PLATELETS: 158 10*3/uL (ref 150–400)
RBC: 4.07 MIL/uL — ABNORMAL LOW (ref 4.22–5.81)
RDW: 13.4 % (ref 11.5–15.5)
WBC: 6.6 10*3/uL (ref 4.0–10.5)

## 2015-05-02 LAB — TROPONIN I
TROPONIN I: 0.07 ng/mL — AB (ref <0.031)
TROPONIN I: 0.05 ng/mL — AB (ref <0.031)
Troponin I: 0.05 ng/mL — ABNORMAL HIGH (ref <0.031)
Troponin I: 0.04 ng/mL — ABNORMAL HIGH (ref <0.031)

## 2015-05-02 LAB — CBG MONITORING, ED: Glucose-Capillary: 103 mg/dL — ABNORMAL HIGH (ref 70–99)

## 2015-05-02 LAB — GLUCOSE, CAPILLARY
Glucose-Capillary: 133 mg/dL — ABNORMAL HIGH (ref 70–99)
Glucose-Capillary: 167 mg/dL — ABNORMAL HIGH (ref 70–99)

## 2015-05-02 LAB — BRAIN NATRIURETIC PEPTIDE: B Natriuretic Peptide: 745.5 pg/mL — ABNORMAL HIGH (ref 0.0–100.0)

## 2015-05-02 MED ORDER — FUROSEMIDE 10 MG/ML IJ SOLN
40.0000 mg | Freq: Once | INTRAMUSCULAR | Status: AC
  Administered 2015-05-02: 40 mg via INTRAVENOUS
  Filled 2015-05-02: qty 4

## 2015-05-02 MED ORDER — SODIUM CHLORIDE 0.9 % IJ SOLN: 3.0000 mL | INTRAMUSCULAR | Status: DC | PRN

## 2015-05-02 MED ORDER — POTASSIUM CHLORIDE CRYS ER 20 MEQ PO TBCR
40.0000 meq | EXTENDED_RELEASE_TABLET | Freq: Once | ORAL | Status: AC
  Administered 2015-05-02: 40 meq via ORAL
  Filled 2015-05-02: qty 2

## 2015-05-02 MED ORDER — LISINOPRIL 10 MG PO TABS
10.0000 mg | ORAL_TABLET | Freq: Every day | ORAL | Status: DC
  Administered 2015-05-02 – 2015-05-05 (×4): 10 mg via ORAL
  Filled 2015-05-02 (×4): qty 1

## 2015-05-02 MED ORDER — SODIUM CHLORIDE 0.9 % IJ SOLN
3.0000 mL | Freq: Two times a day (BID) | INTRAMUSCULAR | Status: DC
  Administered 2015-05-02 – 2015-05-03 (×3): 3 mL via INTRAVENOUS

## 2015-05-02 MED ORDER — ENOXAPARIN SODIUM 40 MG/0.4ML ~~LOC~~ SOLN
40.0000 mg | SUBCUTANEOUS | Status: DC
  Administered 2015-05-02: 40 mg via SUBCUTANEOUS
  Filled 2015-05-02 (×2): qty 0.4

## 2015-05-02 MED ORDER — ACETAMINOPHEN 325 MG PO TABS
650.0000 mg | ORAL_TABLET | ORAL | Status: DC | PRN
  Administered 2015-05-02: 650 mg via ORAL
  Filled 2015-05-02: qty 2

## 2015-05-02 MED ORDER — SODIUM CHLORIDE 0.9 % IV SOLN
250.0000 mL | INTRAVENOUS | Status: DC | PRN
Start: 2015-05-02 — End: 2015-05-03

## 2015-05-02 MED ORDER — ONDANSETRON HCL 4 MG/2ML IJ SOLN
4.0000 mg | Freq: Four times a day (QID) | INTRAMUSCULAR | Status: DC | PRN
  Administered 2015-05-03 – 2015-05-04 (×2): 4 mg via INTRAVENOUS
  Filled 2015-05-02 (×2): qty 2

## 2015-05-02 NOTE — Progress Notes (Signed)
  Echocardiogram 2D Echocardiogram has been performed.  Delcie Roch 05/02/2015, 5:40 PM

## 2015-05-02 NOTE — Progress Notes (Signed)
Pt had 11 beats of Vtach. Pt has no complaints at this time. Paged provider on call. Awaiting response. We will continue to monitor.

## 2015-05-02 NOTE — Progress Notes (Signed)
Received patient from [facility] into room [phone] ; alert, oriented, and in no acute distress. Dr Dietrich Pates paged and made aware of patient arrival to floor and room number.

## 2015-05-02 NOTE — ED Notes (Addendum)
Patient states he is having a dry cough and it is "a little bit hard to breathe", states this started Sunday morning, states when first wakes up it is difficult to breathe but when he is awake for awhile it gets better. Patient reports history of HTN, denies taking medications, states he is "hard headed" when asked why he is not on his medications. Patient denies taking any medications for cough. Patient is also diabetic and does not check or medicate for this either.

## 2015-05-02 NOTE — Progress Notes (Addendum)
ANTICOAGULATION CONSULT NOTE - Initial Consult  Pharmacy Consult for lovenox Indication: VTE prophylaxis  Allergies  Allergen Reactions  . Aspirin Hives    Makes pt feel funny  . Other Nausea And Vomiting    Grape products    Patient Measurements: Height: 6' (182.9 cm) Weight: 178 lb 8 oz (80.967 kg) IBW/kg (Calculated) : 77.6 Heparin Dosing Weight:   Vital Signs: Temp: 98.8 F (37.1 C) (05/02 1804) Temp Source: Oral (05/02 1804) BP: 150/89 mmHg (05/02 1804) Pulse Rate: 81 (05/02 1804)  Labs:  Recent Labs  05/02/15 0330 05/02/15 1225 05/02/15 1842  HGB 12.6*  --   --   HCT 37.9*  --   --   PLT 158  --   --   CREATININE 1.27*  --   --   TROPONINI 0.07* 0.05* 0.04*    Estimated Creatinine Clearance: 76.4 mL/min (by C-G formula based on Cr of 1.27).   Medical History: Past Medical History  Diagnosis Date  . Diabetes mellitus   . Hypertension   . GERD (gastroesophageal reflux disease)     Medications:  Scheduled:  . enoxaparin (LOVENOX) injection  40 mg Subcutaneous Q24H  . lisinopril  10 mg Oral Daily  . sodium chloride  3 mL Intravenous Q12H   Infusions:    Assessment: 50 yo male will be put on lovenox for VTE prophylaxis.  CrCl 76.  Goal of Therapy:   Monitor platelets by anticoagulation protocol: Yes   Plan:  - lovenox 40 mg sq q24h.   Alan Diaz, Alan Diaz 05/02/2015,8:04 PM   ADDENDUM Pharmacy to sign off as SCr appears stable and no adjustments anticipated. Please re-consult if needed.

## 2015-05-02 NOTE — ED Provider Notes (Signed)
CSN: [id number]     Arrival date & time 05/02/15  0203 History   First MD Initiated Contact with Patient 05/02/15 0251     Chief Complaint  Patient presents with  . Cough     (Consider location/radiation/quality/duration/timing/severity/associated sxs/prior Treatment) HPI 50 year old male presents to emergency department with complaint of shortness of breath and cough beginning this morning.  Symptoms have been persistent throughout the day.  Patient reports he has history of asthma as a kid, but no recent history.  Patient is a current smoker.  He reports he has history of hypertension and diabetes, but does not take medication as prescribed.  He denies any fever, chills, nausea, vomiting.  No headache, no nasal congestion, sneezing or runny nose. Past Medical History  Diagnosis Date  . Diabetes mellitus   . Hypertension   . GERD (gastroesophageal reflux disease)    Past Surgical History  Procedure Laterality Date  . Wisdom tooth extraction      bottom LT, top RT   History reviewed. No pertinent family history. History  Substance Use Topics  . Smoking status: Current Every Day Smoker -- 0.30 packs/day    Types: Cigarettes  . Smokeless tobacco: Not on file  . Alcohol Use: Yes     Comment: very occasionally    Review of Systems  See History of Present Illness; otherwise all other systems are reviewed and negative   Allergies  Aspirin and Other  Home Medications   Prior to Admission medications   Medication Sig Start Date End Date Taking? Authorizing Provider  lisinopril (PRINIVIL,ZESTRIL) 10 MG tablet Take 1 tablet (10 mg total) by mouth daily. 09/22/14   Pricilla Loveless, MD  metFORMIN (GLUCOPHAGE) 500 MG tablet Take 500 mg by mouth 2 (two) times daily with a meal.    Historical Provider, MD  ondansetron (ZOFRAN-ODT) 4 MG disintegrating tablet Take 4 mg by mouth every 4 (four) hours as needed for nausea or vomiting.    Historical Provider, MD  oxyCODONE-acetaminophen  (PERCOCET) 5-325 MG per tablet Take 1 tablet by mouth every 4 (four) hours as needed for severe pain. 09/22/14   Pricilla Loveless, MD  pantoprazole (PROTONIX) 40 MG tablet Take 1 tablet (40 mg total) by mouth daily. 09/22/14   Pricilla Loveless, MD  promethazine (PHENERGAN) 25 MG tablet Take 1 tablet (25 mg total) by mouth every 8 (eight) hours as needed for nausea or vomiting. 09/25/14   Raeford Razor, MD   BP 162/112 mmHg  Pulse 84  Temp(Src) 98.1 F (36.7 C) (Oral)  Resp 20  SpO2 97% Physical Exam  Constitutional: He is oriented to person, place, and time. He appears well-developed and well-nourished.  HENT:  Head: Normocephalic and atraumatic.  Nose: Nose normal.  Mouth/Throat: Oropharynx is clear and moist.  Eyes: Conjunctivae and EOM are normal. Pupils are equal, round, and reactive to light.  Neck: Normal range of motion. Neck supple. No JVD present. No tracheal deviation present. No thyromegaly present.  Cardiovascular: Normal rate, regular rhythm, normal heart sounds and intact distal pulses.  Exam reveals no gallop and no friction rub.   No murmur heard. Pulmonary/Chest: Effort normal and breath sounds normal. No stridor. No respiratory distress. He has no wheezes. He has no rales. He exhibits no tenderness.  Abdominal: Soft. Bowel sounds are normal. He exhibits no distension and no mass. There is no tenderness. There is no rebound and no guarding.  Musculoskeletal: Normal range of motion. He exhibits no edema or tenderness.  Lymphadenopathy:  He has no cervical adenopathy.  Neurological: He is alert and oriented to person, place, and time. He displays normal reflexes. He exhibits normal muscle tone. Coordination normal.  Skin: Skin is warm and dry. No rash noted. No erythema. No pallor.  Psychiatric: He has a normal mood and affect. His behavior is normal. Judgment and thought content normal.  Nursing note and vitals reviewed.   ED Course  Procedures (including critical care  time) Labs Review Labs Reviewed  BASIC METABOLIC PANEL - Abnormal; Notable for the following:    Potassium 3.3 (*)    Glucose, Bld 101 (*)    Creatinine, Ser 1.27 (*)    Calcium 8.6 (*)    All other components within normal limits  CBC WITH DIFFERENTIAL/PLATELET - Abnormal; Notable for the following:    RBC 4.07 (*)    Hemoglobin 12.6 (*)    HCT 37.9 (*)    All other components within normal limits  BRAIN NATRIURETIC PEPTIDE - Abnormal; Notable for the following:    B Natriuretic Peptide 745.5 (*)    All other components within normal limits  TROPONIN I - Abnormal; Notable for the following:    Troponin I 0.07 (*)    All other components within normal limits  CBG MONITORING, ED - Abnormal; Notable for the following:    Glucose-Capillary 103 (*)    All other components within normal limits    Imaging Review Dg Chest 2 View  05/02/2015   CLINICAL DATA:  Dyspnea and nonproductive cough for 1 day  EXAM: CHEST  2 VIEW  COMPARISON:  09/22/2014  FINDINGS: There is moderate hyperinflation. There is no confluent airspace consolidation. There is mild interstitial thickening superimposed on the chronic changes and this could represent interstitial pneumonitis or interstitial fluid superimposed on COPD. There are no pleural effusions. Hilar, mediastinal and cardiac contours are unremarkable and unchanged.  IMPRESSION: Interstitial fluid or thickening superimposed on chronic emphysematous hyperinflation. Interstitial pneumonitis or interstitial edema associated with mild CHF would be considerations.   Electronically Signed   By: Ellery Plunk M.D.   On: 05/02/2015 03:34     EKG Interpretation   Date/Time:  Monday May 02 2015 04:26:01 EDT Ventricular Rate:  81 PR Interval:  147 QRS Duration: 100 QT Interval:  366 QTC Calculation: 425 R Axis:   81 Text Interpretation:  Sinus rhythm Probable left atrial enlargement LVH  with secondary repolarization abnormality Probable anterior infarct,  age  indeterminate Baseline wander in lead(s) V3 new inverted t waves laterally  from prior ekg of 09/24/14 Confirmed by Wayde Gopaul  MD, Chelsi Warr ([id number]) on 05/02/2015  4:33:59 AM      MDM   Final diagnoses:  Acute congestive heart failure, unspecified congestive heart failure type  Elevated troponin  Abnormal EKG    50 year old male with shortness breath and wheezing.  Plan for chest x-ray, baseline labs.  Chest x-ray with signs of chronic Micardis but also has pulmonary edema concerning for congestive heart failure.  Will add on EKG, BNP, troponin.  Patient denies any chest pain.  Shortness of breath started today.  No orthopnea or dyspnea on exertion   EKG with new flipped T waves laterally.  Troponin is elevated.  Given constellation of findings of mild congestive heart failure, hypertension, EKG changes, and elevated troponin.  Will discuss with cardiology for their admission.  5:53 AM Case discussed with Dr. Tresa Endo on-call cardiology fellow.  He is unsure if the day.  He would prefer to have him here at  [facility] or at [facility].  Will reconsult in an hour  Plan to give Lasix in the meantime.      Marisa Severin, MD 05/02/15 [phone]

## 2015-05-02 NOTE — H&P (Signed)
Primary Physician: Primary Cardiologist:   HPI: Patinet is a 50 yo who presents for evaluate of SOB and cough  Patinet says over the past couple weeks he has had intermitt SOB  Mowed lawn Sat  Did OK Woke up yesterdsya  Noticed SOB  Didn't feel good  Some wheezing  Couldn't get comfortable  Continued to get worse  Came to er  Did have chest tightness   Not assocaited with activity   Hx of remote asthma   No recent problems Hx of HTN  WSas treated in past  Not now.  Also a hsistroy of DM though off of meds now.   On arrival to ER was 162/112.   CXR showed pulmonary edema   Given IV lasix with signif response  Breathing much better  Chest tightness gone       Past Medical History  Diagnosis Date  . Diabetes mellitus   . Hypertension   . GERD (gastroesophageal reflux disease)     Medications Prior to Admission  Medication Sig Dispense Refill  . lisinopril (PRINIVIL,ZESTRIL) 10 MG tablet Take 1 tablet (10 mg total) by mouth daily. (Patient not taking: Reported on 05/02/2015) 30 tablet 0  . metFORMIN (GLUCOPHAGE) 500 MG tablet Take 500 mg by mouth 2 (two) times daily with a meal.    . ondansetron (ZOFRAN-ODT) 4 MG disintegrating tablet Take 4 mg by mouth every 4 (four) hours as needed for nausea or vomiting.    Marland Kitchen oxyCODONE-acetaminophen (PERCOCET) 5-325 MG per tablet Take 1 tablet by mouth every 4 (four) hours as needed for severe pain. (Patient not taking: Reported on 05/02/2015) 15 tablet 0  . pantoprazole (PROTONIX) 40 MG tablet Take 1 tablet (40 mg total) by mouth daily. (Patient not taking: Reported on 05/02/2015) 30 tablet 0  . promethazine (PHENERGAN) 25 MG tablet Take 1 tablet (25 mg total) by mouth every 8 (eight) hours as needed for nausea or vomiting. (Patient not taking: Reported on 05/02/2015) 15 tablet 0       Infusions:   Allergies  Allergen Reactions  . Aspirin Hives    Makes pt feel funny  . Other Nausea And Vomiting    Grape products    History   Social  History  . Marital Status: Divorced    Spouse Name: N/A  . Number of Children: N/A  . Years of Education: N/A   Occupational History  . Not on file.   Social History Main Topics  . Smoking status: Current Every Day Smoker -- 0.30 packs/day    Types: Cigarettes  . Smokeless tobacco: Not on file  . Alcohol Use: Yes     Comment: very occasionally  . Drug Use: Yes    Special: Marijuana     Comment: denies 06/28/14  . Sexual Activity: Not on file   Other Topics Concern  . Not on file   Social History Narrative    History reviewed. No pertinent family history.  REVIEW OF SYSTEMS:  All systems reviewed  Negative to the above problem except as noted above.    PHYSICAL EXAM: Filed Vitals:   05/02/15 0912  BP: 163/95  Pulse: 79  Temp: 98.5 F (36.9 C)  Resp: 18     Intake/Output Summary (Last 24 hours) at 05/02/15 1005 Last data filed at 05/02/15 0919  Gross per 24 hour  Intake    250 ml  Output   2300 ml  Net  -2050 ml    General:  Well appearing.  No respiratory difficulty HEENT: normal Neck: supple. no JVD. Carotids 2+ bilat; no bruits. No lymphadenopathy or thryomegaly appreciated. Cor: PMI nondisplaced. Regular rate & rhythm. No rubs, gallops or murmurs. Lungs: Rales at R base   Abdomen: soft, nontender, nondistended. No hepatosplenomegaly. No bruits or masses. Good bowel sounds. Extremities: no cyanosis, clubbing, rash, edema Neuro: alert & oriented x 3, cranial nerves grossly intact. moves all 4 extremities w/o difficulty. Affect pleasant.  ECG:  SR 81 bpm  LVH with repolarization.    Results for orders placed or performed during the hospital encounter of 05/02/15 (from the past 24 hour(s))  CBG monitoring, ED     Status: Abnormal   Collection Time: 05/02/15  2:11 AM  Result Value Ref Range   Glucose-Capillary 103 (H) 70 - 99 mg/dL   Comment 1 Notify RN   Basic metabolic panel     Status: Abnormal   Collection Time: 05/02/15  3:30 AM  Result Value Ref  Range   Sodium 137 135 - 145 mmol/L   Potassium 3.3 (L) 3.5 - 5.1 mmol/L   Chloride 108 101 - 111 mmol/L   CO2 24 22 - 32 mmol/L   Glucose, Bld 101 (H) 70 - 99 mg/dL   BUN 13 6 - 20 mg/dL   Creatinine, Ser 0.62 (H) 0.61 - 1.24 mg/dL   Calcium 8.6 (L) 8.9 - 10.3 mg/dL   GFR calc non Af Amer >60 >60 mL/min   GFR calc Af Amer >60 >60 mL/min   Anion gap 5 5 - 15  CBC with Differential     Status: Abnormal   Collection Time: 05/02/15  3:30 AM  Result Value Ref Range   WBC 6.6 4.0 - 10.5 K/uL   RBC 4.07 (L) 4.22 - 5.81 MIL/uL   Hemoglobin 12.6 (L) 13.0 - 17.0 g/dL   HCT 69.4 (L) 85.4 - 62.7 %   MCV 93.1 78.0 - 100.0 fL   MCH 31.0 26.0 - 34.0 pg   MCHC 33.2 30.0 - 36.0 g/dL   RDW 03.5 00.9 - 38.1 %   Platelets 158 150 - 400 K/uL   Neutrophils Relative % 46 43 - 77 %   Neutro Abs 3.0 1.7 - 7.7 K/uL   Lymphocytes Relative 38 12 - 46 %   Lymphs Abs 2.5 0.7 - 4.0 K/uL   Monocytes Relative 12 3 - 12 %   Monocytes Absolute 0.8 0.1 - 1.0 K/uL   Eosinophils Relative 4 0 - 5 %   Eosinophils Absolute 0.3 0.0 - 0.7 K/uL   Basophils Relative 0 0 - 1 %   Basophils Absolute 0.0 0.0 - 0.1 K/uL  Brain natriuretic peptide     Status: Abnormal   Collection Time: 05/02/15  3:30 AM  Result Value Ref Range   B Natriuretic Peptide 745.5 (H) 0.0 - 100.0 pg/mL  Troponin I     Status: Abnormal   Collection Time: 05/02/15  3:30 AM  Result Value Ref Range   Troponin I 0.07 (H) <0.031 ng/mL   Dg Chest 2 View  05/02/2015   CLINICAL DATA:  Dyspnea and nonproductive cough for 1 day  EXAM: CHEST  2 VIEW  COMPARISON:  09/22/2014  FINDINGS: There is moderate hyperinflation. There is no confluent airspace consolidation. There is mild interstitial thickening superimposed on the chronic changes and this could represent interstitial pneumonitis or interstitial fluid superimposed on COPD. There are no pleural effusions. Hilar, mediastinal and cardiac contours are unremarkable and unchanged.  IMPRESSION: Interstitial  fluid or thickening superimposed on chronic emphysematous hyperinflation. Interstitial pneumonitis or interstitial edema associated with mild CHF would be considerations.   Electronically Signed   By: Ellery Plunk M.D.   On: 05/02/2015 03:34     ASSESSMENT:  50 yo with history of HTN and reported DM  Not on any meds Gives history of intermitt SOB  Yesterday started feeling really bad  Came to ER   Found to be extremely hypertensive and in pulm edema  Has responded nicely to IV diuresis.  Volume status is pretty good  Some rales at [facility]  Admit  Tele Give additional IV lasix later today  Labs in am  May not need maintenance dose of lasix Echo to evaluate LV function  EKG with evid of severe LVH Cycle troponins  May presesnt strain in setting of CHF/Htn.  2.  HTN  Start lisinoprl 10  He said he did not have problem with it in past   Just stopped    3.  DM  Check A1 C Hold meds for now    4.  HCM  Check lipids   Counselled on smoking cessation  He says hee is going to quit  5.  Hx GERD  Follow     Dietrich Pates

## 2015-05-03 ENCOUNTER — Encounter (HOSPITAL_COMMUNITY)
Admission: EM | Disposition: A | Discharge status: Home / Self Care | Payer: Medicaid Other | Source: Home / Self Care | Attending: Internal Medicine

## 2015-05-03 DIAGNOSIS — I5042 Chronic combined systolic (congestive) and diastolic (congestive) heart failure: Secondary | ICD-10-CM

## 2015-05-03 DIAGNOSIS — I10 Essential (primary) hypertension: Secondary | ICD-10-CM

## 2015-05-03 DIAGNOSIS — Z79899 Other long term (current) drug therapy: Secondary | ICD-10-CM | POA: Diagnosis not present

## 2015-05-03 DIAGNOSIS — Z79891 Long term (current) use of opiate analgesic: Secondary | ICD-10-CM | POA: Diagnosis not present

## 2015-05-03 DIAGNOSIS — E780 Pure hypercholesterolemia: Secondary | ICD-10-CM

## 2015-05-03 DIAGNOSIS — E785 Hyperlipidemia, unspecified: Secondary | ICD-10-CM | POA: Diagnosis present

## 2015-05-03 DIAGNOSIS — I5043 Acute on chronic combined systolic (congestive) and diastolic (congestive) heart failure: Secondary | ICD-10-CM | POA: Diagnosis present

## 2015-05-03 DIAGNOSIS — E119 Type 2 diabetes mellitus without complications: Secondary | ICD-10-CM | POA: Diagnosis not present

## 2015-05-03 DIAGNOSIS — Z886 Allergy status to analgesic agent status: Secondary | ICD-10-CM | POA: Diagnosis not present

## 2015-05-03 DIAGNOSIS — K219 Gastro-esophageal reflux disease without esophagitis: Secondary | ICD-10-CM | POA: Diagnosis present

## 2015-05-03 DIAGNOSIS — I2510 Atherosclerotic heart disease of native coronary artery without angina pectoris: Secondary | ICD-10-CM

## 2015-05-03 DIAGNOSIS — I214 Non-ST elevation (NSTEMI) myocardial infarction: Secondary | ICD-10-CM | POA: Diagnosis present

## 2015-05-03 DIAGNOSIS — I25110 Atherosclerotic heart disease of native coronary artery with unstable angina pectoris: Secondary | ICD-10-CM | POA: Diagnosis not present

## 2015-05-03 DIAGNOSIS — I255 Ischemic cardiomyopathy: Secondary | ICD-10-CM | POA: Diagnosis present

## 2015-05-03 DIAGNOSIS — F17210 Nicotine dependence, cigarettes, uncomplicated: Secondary | ICD-10-CM | POA: Diagnosis present

## 2015-05-03 DIAGNOSIS — I5041 Acute combined systolic (congestive) and diastolic (congestive) heart failure: Secondary | ICD-10-CM | POA: Diagnosis not present

## 2015-05-03 DIAGNOSIS — E1165 Type 2 diabetes mellitus with hyperglycemia: Secondary | ICD-10-CM | POA: Diagnosis present

## 2015-05-03 DIAGNOSIS — Z9114 Patient's other noncompliance with medication regimen: Secondary | ICD-10-CM | POA: Diagnosis present

## 2015-05-03 DIAGNOSIS — R9431 Abnormal electrocardiogram [ECG] [EKG]: Secondary | ICD-10-CM | POA: Diagnosis present

## 2015-05-03 HISTORY — DX: Chronic combined systolic (congestive) and diastolic (congestive) heart failure: I50.42

## 2015-05-03 HISTORY — PX: PERCUTANEOUS CORONARY STENT INTERVENTION (PCI-S): SHX5485

## 2015-05-03 HISTORY — PX: CARDIAC CATHETERIZATION: SHX172

## 2015-05-03 LAB — POCT I-STAT 3, ART BLOOD GAS (G3+)
Acid-Base Excess: 1 mmol/L (ref 0.0–2.0)
Bicarbonate: 26 mEq/L — ABNORMAL HIGH (ref 20.0–24.0)
O2 Saturation: 99 %
TCO2: 27 mmol/L (ref 0–100)
pCO2 arterial: 43.3 mmHg (ref 35.0–45.0)
pH, Arterial: 7.386 (ref 7.350–7.450)
pO2, Arterial: 123 mmHg — ABNORMAL HIGH (ref 80.0–100.0)

## 2015-05-03 LAB — GLUCOSE, CAPILLARY
Glucose-Capillary: 120 mg/dL — ABNORMAL HIGH (ref 70–99)
Glucose-Capillary: 195 mg/dL — ABNORMAL HIGH (ref 70–99)

## 2015-05-03 LAB — BASIC METABOLIC PANEL
Anion gap: 14 (ref 5–15)
BUN: 11 mg/dL (ref 6–20)
CALCIUM: 9.2 mg/dL (ref 8.9–10.3)
CO2: 22 mmol/L (ref 22–32)
Chloride: 101 mmol/L (ref 101–111)
Creatinine, Ser: 1.23 mg/dL (ref 0.61–1.24)
Glucose, Bld: 99 mg/dL (ref 70–99)
POTASSIUM: 3.8 mmol/L (ref 3.5–5.1)
Sodium: 137 mmol/L (ref 135–145)

## 2015-05-03 LAB — LIPID PANEL
Cholesterol: 248 mg/dL — ABNORMAL HIGH (ref 0–200)
HDL: 41 mg/dL (ref >40)
LDL Cholesterol: 182 mg/dL — ABNORMAL HIGH (ref 0–99)
TRIGLYCERIDES: 124 mg/dL (ref <150)
Total CHOL/HDL Ratio: 6 RATIO
VLDL: 25 mg/dL (ref 0–40)

## 2015-05-03 LAB — PROTIME-INR
INR: 1.05 (ref 0.00–1.49)
Prothrombin Time: 13.9 seconds (ref 11.6–15.2)

## 2015-05-03 LAB — POCT I-STAT 3, VENOUS BLOOD GAS (G3P V)
ACID-BASE DEFICIT: 1 mmol/L (ref 0.0–2.0)
BICARBONATE: 24.4 meq/L — AB (ref 20.0–24.0)
O2 Saturation: 73 %
PH VEN: 7.358 — AB (ref 7.250–7.300)
PO2 VEN: 41 mmHg (ref 30.0–45.0)
TCO2: 26 mmol/L (ref 0–100)
pCO2, Ven: 43.4 mmHg — ABNORMAL LOW (ref 45.0–50.0)

## 2015-05-03 LAB — HEMOGLOBIN A1C
HEMOGLOBIN A1C: 7 % — AB (ref 4.8–5.6)
MEAN PLASMA GLUCOSE: 154 mg/dL

## 2015-05-03 LAB — POCT ACTIVATED CLOTTING TIME: Activated Clotting Time: 460 seconds

## 2015-05-03 LAB — BRAIN NATRIURETIC PEPTIDE: B NATRIURETIC PEPTIDE 5: 606.9 pg/mL — AB (ref 0.0–100.0)

## 2015-05-03 SURGERY — PERCUTANEOUS CORONARY STENT INTERVENTION (PCI-S)

## 2015-05-03 MED ORDER — CARVEDILOL 3.125 MG PO TABS
6.2500 mg | ORAL_TABLET | Freq: Two times a day (BID) | ORAL | Status: DC
  Administered 2015-05-03 – 2015-05-05 (×4): 6.25 mg via ORAL
  Filled 2015-05-03 (×4): qty 2

## 2015-05-03 MED ORDER — ATORVASTATIN CALCIUM 40 MG PO TABS
80.0000 mg | ORAL_TABLET | Freq: Every day | ORAL | Status: DC
  Administered 2015-05-03 – 2015-05-04 (×2): 80 mg via ORAL
  Filled 2015-05-03: qty 1
  Filled 2015-05-03 (×2): qty 2

## 2015-05-03 MED ORDER — BIVALIRUDIN 250 MG IV SOLR
INTRAVENOUS | Status: AC
  Filled 2015-05-03: qty 250

## 2015-05-03 MED ORDER — TICAGRELOR 90 MG PO TABS
90.0000 mg | ORAL_TABLET | Freq: Two times a day (BID) | ORAL | Status: DC
  Administered 2015-05-04 – 2015-05-05 (×3): 90 mg via ORAL
  Filled 2015-05-03 (×3): qty 1

## 2015-05-03 MED ORDER — SODIUM CHLORIDE 0.9 % IJ SOLN: 3.0000 mL | INTRAMUSCULAR | Status: DC | PRN

## 2015-05-03 MED ORDER — HEPARIN (PORCINE) IN NACL 2-0.9 UNIT/ML-% IJ SOLN
INTRAMUSCULAR | Status: AC
  Filled 2015-05-03: qty 1000

## 2015-05-03 MED ORDER — NITROGLYCERIN IN D5W 200-5 MCG/ML-% IV SOLN
INTRAVENOUS | Status: AC
  Filled 2015-05-03: qty 250

## 2015-05-03 MED ORDER — MIDAZOLAM HCL 2 MG/2ML IJ SOLN
INTRAMUSCULAR | Status: DC | PRN
  Administered 2015-05-03: 2 mg via INTRAVENOUS
  Administered 2015-05-03: 1 mg via INTRAVENOUS

## 2015-05-03 MED ORDER — HEPARIN SODIUM (PORCINE) 1000 UNIT/ML IJ SOLN
INTRAMUSCULAR | Status: AC
  Filled 2015-05-03: qty 1

## 2015-05-03 MED ORDER — SODIUM CHLORIDE 0.9 % IV SOLN
250.0000 mL | INTRAVENOUS | Status: DC | PRN
Start: 2015-05-03 — End: 2015-05-03

## 2015-05-03 MED ORDER — LIDOCAINE HCL (PF) 1 % IJ SOLN
INTRAMUSCULAR | Status: AC
  Filled 2015-05-03: qty 30

## 2015-05-03 MED ORDER — BIVALIRUDIN BOLUS VIA INFUSION
INTRAVENOUS | Status: DC | PRN
  Administered 2015-05-03: 59.325 mg via INTRAVENOUS

## 2015-05-03 MED ORDER — PANTOPRAZOLE SODIUM 40 MG PO TBEC
40.0000 mg | DELAYED_RELEASE_TABLET | Freq: Every day | ORAL | Status: DC
  Administered 2015-05-04 – 2015-05-05 (×2): 40 mg via ORAL
  Filled 2015-05-03 (×2): qty 1

## 2015-05-03 MED ORDER — NITROGLYCERIN 1 MG/10 ML FOR IR/CATH LAB
INTRA_ARTERIAL | Status: DC | PRN
  Administered 2015-05-03 (×2): 200 ug via INTRA_ARTERIAL

## 2015-05-03 MED ORDER — SODIUM CHLORIDE 0.9 % IJ SOLN: 3.0000 mL | Freq: Two times a day (BID) | INTRAMUSCULAR | Status: DC

## 2015-05-03 MED ORDER — ALUM & MAG HYDROXIDE-SIMETH 200-200-20 MG/5ML PO SUSP
30.0000 mL | ORAL | Status: DC | PRN
  Filled 2015-05-03: qty 30

## 2015-05-03 MED ORDER — SODIUM CHLORIDE 0.9 % IV SOLN
250.0000 mg | INTRAVENOUS | Status: DC | PRN
  Administered 2015-05-03: 1.75 mg/kg/h via INTRAVENOUS

## 2015-05-03 MED ORDER — TICAGRELOR 90 MG PO TABS
ORAL_TABLET | ORAL | Status: DC | PRN
  Administered 2015-05-03: 180 mg via ORAL

## 2015-05-03 MED ORDER — FENTANYL CITRATE (PF) 100 MCG/2ML IJ SOLN
INTRAMUSCULAR | Status: DC | PRN
  Administered 2015-05-03 (×3): 25 ug via INTRAVENOUS

## 2015-05-03 MED ORDER — MIDAZOLAM HCL 2 MG/2ML IJ SOLN
INTRAMUSCULAR | Status: AC
  Filled 2015-05-03: qty 2

## 2015-05-03 MED ORDER — IOHEXOL 350 MG/ML SOLN
INTRAVENOUS | Status: DC | PRN
  Administered 2015-05-03 (×2): 100 mL via INTRA_ARTERIAL

## 2015-05-03 MED ORDER — TICAGRELOR 90 MG PO TABS
ORAL_TABLET | ORAL | Status: AC
  Filled 2015-05-03: qty 2

## 2015-05-03 MED ORDER — NITROGLYCERIN IN D5W 200-5 MCG/ML-% IV SOLN
0.0000 ug/min | INTRAVENOUS | Status: DC
  Administered 2015-05-03: 20 ug/min via INTRAVENOUS

## 2015-05-03 MED ORDER — HEPARIN SODIUM (PORCINE) 1000 UNIT/ML IJ SOLN
INTRAMUSCULAR | Status: DC | PRN
  Administered 2015-05-03: 4000 [IU] via INTRAVENOUS

## 2015-05-03 MED ORDER — VERAPAMIL HCL 2.5 MG/ML IV SOLN
INTRAVENOUS | Status: AC
  Filled 2015-05-03: qty 2

## 2015-05-03 MED ORDER — SODIUM CHLORIDE 0.9 % IV SOLN
0.2500 mg/kg/h | INTRAVENOUS | Status: DC
  Filled 2015-05-03: qty 250

## 2015-05-03 MED ORDER — PROMETHAZINE HCL 25 MG/ML IJ SOLN
12.5000 mg | Freq: Four times a day (QID) | INTRAMUSCULAR | Status: DC | PRN
  Administered 2015-05-03 – 2015-05-05 (×3): 12.5 mg via INTRAVENOUS
  Filled 2015-05-03 (×3): qty 1

## 2015-05-03 MED ORDER — SODIUM CHLORIDE 0.9 % WEIGHT BASED INFUSION
3.0000 mL/kg/h | INTRAVENOUS | Status: AC
  Administered 2015-05-03: 3 mL/kg/h via INTRAVENOUS

## 2015-05-03 MED ORDER — CARVEDILOL 3.125 MG PO TABS
3.1250 mg | ORAL_TABLET | Freq: Two times a day (BID) | ORAL | Status: DC
  Administered 2015-05-03: 3.125 mg via ORAL
  Filled 2015-05-03 (×3): qty 1

## 2015-05-03 MED ORDER — NITROGLYCERIN 1 MG/10 ML FOR IR/CATH LAB
INTRA_ARTERIAL | Status: AC
  Filled 2015-05-03: qty 10

## 2015-05-03 MED ORDER — FENTANYL CITRATE (PF) 100 MCG/2ML IJ SOLN
INTRAMUSCULAR | Status: AC
  Filled 2015-05-03: qty 2

## 2015-05-03 MED ORDER — MORPHINE SULFATE 2 MG/ML IJ SOLN
2.0000 mg | INTRAMUSCULAR | Status: DC | PRN
  Administered 2015-05-03: 23:00:00 2 mg via INTRAVENOUS
  Filled 2015-05-03: qty 1

## 2015-05-03 MED ORDER — VERAPAMIL HCL 2.5 MG/ML IV SOLN
INTRAVENOUS | Status: DC | PRN
  Administered 2015-05-03: 16:00:00 via INTRA_ARTERIAL

## 2015-05-03 SURGICAL SUPPLY — 28 items
BALLN EMERGE MR 2.5X12 (BALLOONS) ×4
BALLN ~~LOC~~ EMERGE MR 3.5X15 (BALLOONS) ×4
BALLN ~~LOC~~ EMERGE MR 4.0X20 (BALLOONS) ×4
BALLOON EMERGE MR 2.5X12 (BALLOONS) ×2 IMPLANT
BALLOON ~~LOC~~ EMERGE MR 3.5X15 (BALLOONS) ×2 IMPLANT
BALLOON ~~LOC~~ EMERGE MR 4.0X20 (BALLOONS) ×2 IMPLANT
CATH INFINITI 5 FR JL3.5 (CATHETERS) ×4 IMPLANT
CATH INFINITI 5FR ANG PIGTAIL (CATHETERS) ×4 IMPLANT
CATH INFINITI JR4 5F (CATHETERS) ×4 IMPLANT
CATH OPTICROSS 40MHZ (CATHETERS) ×4 IMPLANT
CATH SWAN GANZ 7F STRAIGHT (CATHETERS) ×4 IMPLANT
CATH VISTA GUIDE 6FR XBLAD3.5 (CATHETERS) ×4 IMPLANT
DEVICE RAD COMP TR BAND LRG (VASCULAR PRODUCTS) ×4 IMPLANT
GLIDESHEATH SLEND SS 6F .021 (SHEATH) ×4 IMPLANT
GUIDE CATH RUNWAY 6FR FR4 (CATHETERS) ×4 IMPLANT
KIT ENCORE 26 ADVANTAGE (KITS) ×4 IMPLANT
KIT HEART LEFT (KITS) ×4 IMPLANT
PACK CARDIAC CATHETERIZATION (CUSTOM PROCEDURE TRAY) ×4 IMPLANT
SHEATH PINNACLE 7F 10CM (SHEATH) ×4 IMPLANT
SLED PULL BACK IVUS (MISCELLANEOUS) ×4 IMPLANT
STENT SYNERGY DES 3.5X20 (Permanent Stent) ×4 IMPLANT
STENT SYNERGY DES 3.5X24 (Permanent Stent) ×4 IMPLANT
TRANSDUCER W/STOPCOCK (MISCELLANEOUS) ×8 IMPLANT
TUBING ART PRESS 72  MALE/FEM (TUBING) ×2
TUBING ART PRESS 72 MALE/FEM (TUBING) ×2 IMPLANT
TUBING CIL FLEX 10 FLL-RA (TUBING) ×4 IMPLANT
WIRE COUGAR XT STRL 190CM (WIRE) ×4 IMPLANT
WIRE SAFE-T 1.5MM-J .035X260CM (WIRE) ×4 IMPLANT

## 2015-05-03 NOTE — Progress Notes (Signed)
Patient Name: Alan Diaz Date of Encounter: 05/03/2015  Primary cardiologist: new - Dr. Tenny Craw   Principal Problem:   Acute on chronic combined systolic and diastolic heart failure Active Problems:   Diabetes   HYPERCHOLESTEROLEMIA   TOBACCO ABUSE   ACID REFLUX DISEASE   CHF (congestive heart failure)   Essential hypertension    SUBJECTIVE  Denies any CP or SOB  CURRENT MEDS . enoxaparin (LOVENOX) injection  40 mg Subcutaneous Q24H  . lisinopril  10 mg Oral Daily  . sodium chloride  3 mL Intravenous Q12H    OBJECTIVE  Filed Vitals:   05/02/15 2045 05/03/15 0156 05/03/15 0500 05/03/15 0600  BP: 151/93 143/99  132/75  Pulse: 79 74  75  Temp: 99.5 F (37.5 C) 98 F (36.7 C)  97.6 F (36.4 C)  TempSrc: Oral Oral  Oral  Resp: Height:      Weight:   174 lb 6.4 oz (79.107 kg)   SpO2: 99% 99%  98%    Intake/Output Summary (Last 24 hours) at 05/03/15 0745 Last data filed at 05/03/15 0635  Gross per 24 hour  Intake   1232 ml  Output   3700 ml  Net  -2468 ml   Filed Weights   05/02/15 0746 05/02/15 0912 05/03/15 0500  Weight: 183 lb (83.008 kg) 178 lb 8 oz (80.967 kg) 174 lb 6.4 oz (79.107 kg)    PHYSICAL EXAM  General: Pleasant, NAD. Neuro: Alert and oriented X 3. Moves all extremities spontaneously. Psych: Normal affect. HEENT:  Normal  Neck: Supple without bruits or JVD. Lungs:  Resp regular and unlabored, CTA. Heart: RRR no s3, s4, or murmurs. Abdomen: Soft, non-tender, non-distended, BS + x 4.  Extremities: No clubbing, cyanosis or edema. DP/PT/Radials 2+ and equal bilaterally.  Accessory Clinical Findings  CBC  Recent Labs  05/02/15 0330  WBC 6.6  NEUTROABS 3.0  HGB 12.6*  HCT 37.9*  MCV 93.1  PLT 158   Basic Metabolic Panel  Recent Labs  05/02/15 0330 05/03/15 0429  NA 137 137  K 3.3* 3.8  CL 108 101  CO2 24 22  GLUCOSE 101* 99  BUN 13 11  CREATININE 1.27* 1.23  CALCIUM 8.6* 9.2   Cardiac Enzymes  Recent  Labs  05/02/15 1225 05/02/15 1842 05/02/15 2227  TROPONINI 0.05* 0.04* 0.05*   Hemoglobin A1C  Recent Labs  05/02/15 1225  HGBA1C 7.0*   Fasting Lipid Panel  Recent Labs  05/03/15 0429  CHOL 248*  HDL 41  LDLCALC 182*  TRIG 124  CHOLHDL 6.0    TELE NSR with HR 60-80s, 1 episode of 18 beats run of NSVT around 22:25pm last night    ECG  NSR with TWI in inferior and V5-V6 leads  Echocardiogram 05/02/2015  LV EF: 25% -  30%  ------------------------------------------------------------------- Indications:   CHF - 428.0.  ------------------------------------------------------------------- History:  Risk factors: Current tobacco use. Hypertension. Diabetes mellitus. Dyslipidemia.  ------------------------------------------------------------------- Study Conclusions  - Left ventricle: The cavity size was mildly dilated. Wall thickness was increased in a pattern of mild LVH. Systolic function was severely reduced. The estimated ejection fraction was in the range of 25% to 30%. There is moderate hypokinesis of the mid-apicalanteroseptal myocardium. Doppler parameters are consistent with abnormal left ventricular relaxation (grade 1 diastolic dysfunction).    Radiology/Studies  Dg Chest 2 View  05/02/2015   CLINICAL DATA:  Dyspnea and nonproductive cough for 1 day  EXAM: CHEST  2 VIEW  COMPARISON:  09/22/2014  FINDINGS: There is moderate hyperinflation. There is no confluent airspace consolidation. There is mild interstitial thickening superimposed on the chronic changes and this could represent interstitial pneumonitis or interstitial fluid superimposed on COPD. There are no pleural effusions. Hilar, mediastinal and cardiac contours are unremarkable and unchanged.  IMPRESSION: Interstitial fluid or thickening superimposed on chronic emphysematous hyperinflation. Interstitial pneumonitis or interstitial edema associated with mild CHF would be  considerations.   Electronically Signed   By: Ellery Plunk M.D.   On: 05/02/2015 03:34    ASSESSMENT AND PLAN  50 yo male with PMH of HTN and DM no longer on medication came in with HF symptom. BP in ED was 160/100s. Restarted on lisinopril for HTN.  1. Acute heart failure, likely diastolic with possible systolic component  - EKG shows severe LVH, along with new TWI in V5-V6 and inferior lead  - Echo 05/02/2015 EF 25-30%, moderate hypokinesis of mid-apicoanteroseptal myocardium, grade 1 diastolic HF  - given TWI on EKG and WMA on echo, will discuss with MD regarding ischemic workup, cath vs myoview. Prefer cath, likely can be done today  - off lasix at this point, appears to be euvolemic, however given severe LV dysfunction, will need PO lasix on discharge depending timing of ischemic workup  - continue lisinopril, start on coreg. Reevaluate LV function in 3 month with echo, if <35%, may need ICD.   2. Borderline elevated trop in the setting of HF  - likely demand ischemia, however warrant further ischemic workup  3. Uncontrolled HTN: see #1   4. Uncontrolled DM  - A1C 7.0, will need to restart Metformin on discharge, further adjustment by PCP  5. Hyperlipidemia  - chol 248, LDL 182, HDL 41  - start statin, start lipitor 80mg    Signed, Amedeo Plenty Pager: [phone]  Patient examined chart reviewed.  Given severity of LV dysfunction favor right and left heat cath today Discussed with patient willing to proceed.  ECG changes likely related to LVH  Charlton Haws

## 2015-05-03 NOTE — Progress Notes (Addendum)
Patient c/o of left flank pain making him vomit. Gave zofran no relief. BP elevated up in 200's and continues to vomit. Ekg done questionable increased ST depression in V5. I notified Dr. Shirlee Latch medication orders received and made him aware of EKG asked for him to look at it in the computer. This is not a new pain per patient he states this happens about once a month. Ntg drip ordered I will continue to monitor elevated  BP and monitor heart rate that is elevated up to 140 while vomiting at rest 80's. Phenergan and morphine given  little relief at this time  I will continue to monitor patient  and call MD if needed.

## 2015-05-03 NOTE — Progress Notes (Signed)
UR completed 

## 2015-05-03 NOTE — Interval H&P Note (Signed)
History and Physical Interval Note:  05/03/2015 3:01 PM  Alan Diaz  has presented today for surgery, with the diagnosis of cp  The various methods of treatment have been discussed with the patient and family. After consideration of risks, benefits and other options for treatment, the patient has consented to  Procedure(s): Left Heart Cath and Coronary Angiography (N/A) as a surgical intervention .  The patient's history has been reviewed, patient examined, no change in status, stable for surgery.  I have reviewed the patient's chart and labs.  Questions were answered to the patient's satisfaction.   Cath Lab Visit (complete for each Cath Lab visit)  Clinical Evaluation Leading to the Procedure:   ACS: No.  Non-ACS:    Anginal Classification: CCS III  Anti-ischemic medical therapy: No Therapy  Non-Invasive Test Results: No non-invasive testing performed  Prior CABG: No previous CABG        Alan Diaz [facility] 05/03/2015 3:01 PM'

## 2015-05-03 NOTE — Progress Notes (Signed)
Site area: right groin  Site Prior to Removal:  Level 0  Pressure Applied For 20 MINUTES    Minutes Beginning at 1957  Manual:   Yes.    Patient Status During Pull:  Vagal recovered well   Post Pull Groin Site:  Level 0  Post Pull Instructions Given:  Yes.    Post Pull Pulses Present:  Yes.    Dressing Applied:  Yes.    Comments:  Patient became diaphoretic heart rate dropped from 90's to 49 and SBP dropped from 170's to 101  Standing protocol  measures done patient  recovered well. Right groin no complications level 0 patient  instructed with post groin care and bedrest instructions.

## 2015-05-03 NOTE — H&P (View-Only) (Signed)
 Patient Name: Alan Diaz Date of Encounter: 05/03/2015  Primary cardiologist: new - Dr. Ross   Principal Problem:   Acute on chronic combined systolic and diastolic heart failure Active Problems:   Diabetes   HYPERCHOLESTEROLEMIA   TOBACCO ABUSE   ACID REFLUX DISEASE   CHF (congestive heart failure)   Essential hypertension    SUBJECTIVE  Denies any CP or SOB  CURRENT MEDS . enoxaparin (LOVENOX) injection  40 mg Subcutaneous Q24H  . lisinopril  10 mg Oral Daily  . sodium chloride  3 mL Intravenous Q12H    OBJECTIVE  Filed Vitals:   05/02/15 2045 05/03/15 0156 05/03/15 0500 05/03/15 0600  BP: 151/93 143/99  132/75  Pulse: 79 74  75  Temp: 99.5 F (37.5 C) 98 F (36.7 C)  97.6 F (36.4 C)  TempSrc: Oral Oral  Oral  Resp: 18 18  18  Height:      Weight:   174 lb 6.4 oz (79.107 kg)   SpO2: 99% 99%  98%    Intake/Output Summary (Last 24 hours) at 05/03/15 0745 Last data filed at 05/03/15 0635  Gross per 24 hour  Intake   1232 ml  Output   3700 ml  Net  -2468 ml   Filed Weights   05/02/15 0746 05/02/15 0912 05/03/15 0500  Weight: 183 lb (83.008 kg) 178 lb 8 oz (80.967 kg) 174 lb 6.4 oz (79.107 kg)    PHYSICAL EXAM  General: Pleasant, NAD. Neuro: Alert and oriented X 3. Moves all extremities spontaneously. Psych: Normal affect. HEENT:  Normal  Neck: Supple without bruits or JVD. Lungs:  Resp regular and unlabored, CTA. Heart: RRR no s3, s4, or murmurs. Abdomen: Soft, non-tender, non-distended, BS + x 4.  Extremities: No clubbing, cyanosis or edema. DP/PT/Radials 2+ and equal bilaterally.  Accessory Clinical Findings  CBC  Recent Labs  05/02/15 0330  WBC 6.6  NEUTROABS 3.0  HGB 12.6*  HCT 37.9*  MCV 93.1  PLT 158   Basic Metabolic Panel  Recent Labs  05/02/15 0330 05/03/15 0429  NA 137 137  K 3.3* 3.8  CL 108 101  CO2 24 22  GLUCOSE 101* 99  BUN 13 11  CREATININE 1.27* 1.23  CALCIUM 8.6* 9.2   Cardiac Enzymes  Recent  Labs  05/02/15 1225 05/02/15 1842 05/02/15 2227  TROPONINI 0.05* 0.04* 0.05*   Hemoglobin A1C  Recent Labs  05/02/15 1225  HGBA1C 7.0*   Fasting Lipid Panel  Recent Labs  05/03/15 0429  CHOL 248*  HDL 41  LDLCALC 182*  TRIG 124  CHOLHDL 6.0    TELE NSR with HR 60-80s, 1 episode of 18 beats run of NSVT around 22:25pm last night    ECG  NSR with TWI in inferior and V5-V6 leads  Echocardiogram 05/02/2015  LV EF: 25% -  30%  ------------------------------------------------------------------- Indications:   CHF - 428.0.  ------------------------------------------------------------------- History:  Risk factors: Current tobacco use. Hypertension. Diabetes mellitus. Dyslipidemia.  ------------------------------------------------------------------- Study Conclusions  - Left ventricle: The cavity size was mildly dilated. Wall thickness was increased in a pattern of mild LVH. Systolic function was severely reduced. The estimated ejection fraction was in the range of 25% to 30%. There is moderate hypokinesis of the mid-apicalanteroseptal myocardium. Doppler parameters are consistent with abnormal left ventricular relaxation (grade 1 diastolic dysfunction).    Radiology/Studies  Dg Chest 2 View  05/02/2015   CLINICAL DATA:  Dyspnea and nonproductive cough for 1 day  EXAM: CHEST  2 VIEW    COMPARISON:  09/22/2014  FINDINGS: There is moderate hyperinflation. There is no confluent airspace consolidation. There is mild interstitial thickening superimposed on the chronic changes and this could represent interstitial pneumonitis or interstitial fluid superimposed on COPD. There are no pleural effusions. Hilar, mediastinal and cardiac contours are unremarkable and unchanged.  IMPRESSION: Interstitial fluid or thickening superimposed on chronic emphysematous hyperinflation. Interstitial pneumonitis or interstitial edema associated with mild CHF would be  considerations.   Electronically Signed   By: Alan Diaz M.D.   On: 05/02/2015 03:34    ASSESSMENT AND PLAN  50 yo male with PMH of HTN and DM no longer on medication came in with HF symptom. BP in ED was 160/100s. Restarted on lisinopril for HTN.  1. Acute heart failure, likely diastolic with possible systolic component  - EKG shows severe LVH, along with new TWI in V5-V6 and inferior lead  - Echo 05/02/2015 EF 25-30%, moderate hypokinesis of mid-apicoanteroseptal myocardium, grade 1 diastolic HF  - given TWI on EKG and WMA on echo, will discuss with MD regarding ischemic workup, cath vs myoview. Prefer cath, likely can be done today  - off lasix at this point, appears to be euvolemic, however given severe LV dysfunction, will need PO lasix on discharge depending timing of ischemic workup  - continue lisinopril, start on coreg. Reevaluate LV function in 3 month with echo, if <35%, may need ICD.   2. Borderline elevated trop in the setting of HF  - likely demand ischemia, however warrant further ischemic workup  3. Uncontrolled HTN: see #1   4. Uncontrolled DM  - A1C 7.0, will need to restart Metformin on discharge, further adjustment by PCP  5. Hyperlipidemia  - chol 248, LDL 182, HDL 41  - start statin, start lipitor 80mg   Signed, Alan Diaz, Hao PA-C Pager: [phone]  Patient examined chart reviewed.  Given severity of LV dysfunction favor right and left heat cath today Discussed with patient willing to proceed.  ECG changes likely related to LVH  Alan Diaz   

## 2015-05-03 NOTE — Care Management Note (Signed)
Case Management Note  Patient Details  Name: Alan Diaz MRN: [id number] Date of Birth: Jan 04, 1965  Subjective/Objective:                    Action/Plan:   Expected Discharge Date:                  Expected Discharge Plan:     In-House Referral:     Discharge planning Services     Post Acute Care Choice:    Choice offered to:     DME Arranged:    DME Agency:     HH Arranged:    HH Agency:     Status of Service:     Medicare Important Message Given:    Date Medicare IM Given:    Medicare IM give by:    Date Additional Medicare IM Given:    Additional Medicare Important Message give by:     If discussed at Long Length of Stay Meetings, dates discussed:    Additional Comments:  Silvio Pate, RN 05/03/2015, 3:28 PM

## 2015-05-03 NOTE — Progress Notes (Signed)
Pt was hypertensive this evening. He did not receive PM Coreg dose as this was due to start at higher dose tomorrow AM. Will ask nurse to give this evening. Also discussed EKGs with Dr. Royann Shivers given that both post cath tracings show pronounced anterolateral TWI. Patient asymptomatic, otherwise hemodynamically stable. Dr. Royann Shivers feels this may represent LVH with repol abnormality and that prior EKGs may have been pseudonormalized in the setting of ischemia. Will continue to monitor patient and follow BP closely. F/u EKG in AM. Ronie Spies PA-C

## 2015-05-03 NOTE — Progress Notes (Signed)
Heart Failure Navigator Consult Note  Presentation: Alan Diaz is a 50 yo who presents for evaluate of SOB and cough.  He says over the past couple weeks he has had intermitt SOB Mowed lawn Sat Did OK Woke up yesterday Noticed SOB Didn't feel good Some wheezing Couldn't get comfortable Continued to get worse Came to ER- Did have chest tightness Not associated with activity. Hx of remote asthma. No recent problems. Hx of HTN Was treated in past Not now. Also a history of DM though off of meds now.    Past Medical History  Diagnosis Date  . Diabetes mellitus   . Hypertension   . GERD (gastroesophageal reflux disease)     History   Social History  . Marital Status: Divorced    Spouse Name: N/A  . Number of Children: N/A  . Years of Education: N/A   Social History Main Topics  . Smoking status: Current Every Day Smoker -- 0.30 packs/day    Types: Cigarettes  . Smokeless tobacco: Not on file  . Alcohol Use: Yes     Comment: very occasionally  . Drug Use: Yes    Special: Marijuana     Comment: denies 06/28/14  . Sexual Activity: Not on file   Other Topics Concern  . None   Social History Narrative    ECHO:Study Conclusions--05/02/15  - Left ventricle: The cavity size was mildly dilated. Wall thickness was increased in a pattern of mild LVH. Systolic function was severely reduced. The estimated ejection fraction was in the range of 25% to 30%. There is moderate hypokinesis of the mid-apicalanteroseptal myocardium. Doppler parameters are consistent with abnormal left ventricular relaxation (grade 1 diastolic dysfunction).  Transthoracic echocardiography. M-mode, complete 2D, spectral Doppler, and color Doppler. Birthdate: Patient birthdate: 1965/03/16. Age: Patient is 50 yr old. Sex: Gender: male. BMI: 24.1 kg/m^2. Blood pressure:   157/95 Patient status: Inpatient. Study date: Study date: 05/02/2015. Study time: 05:14 PM.  Location: Bedside.  BNP    Component Value Date/Time   BNP 606.9* 05/03/2015 0429    ProBNP No results found for: PROBNP   Education Assessment and Provision:  Detailed education and instructions provided on heart failure disease management including the following:  Signs and symptoms of Heart Failure When to call the physician Importance of daily weights Low sodium diet Fluid restriction Medication management Anticipated future follow-up appointments  Patient education given on each of the above topics.  Patient acknowledges understanding and acceptance of all instructions.  I spoke briefly with [name] regarding his new diagnosis of HF.  He is for a possible cardiac catheterization today.  He has a scale at home and can weigh daily.  I explained to him the importance of daily weights and how to use daily weights as a tool in relation to signs and symptoms of HF.  We reviewed a low sodium diet and high sodium foods to avoid.  He tells me that he will have no issue with getting and taking prescribed medications.  I will plan to return to reinforce education after he has completed cardiac catheterization.  Education Materials:  "Living Better With Heart Failure" Booklet, Daily Weight Tracker Tool    High Risk Criteria for Readmission and/or Poor Patient Outcomes:   EF <30%- 25-30%  2 or more admissions in 6 months- No--New HF  Difficult social situation- No  Demonstrates medication noncompliance- No   Barriers of Care:  Knowledge --New HF and compliance  Discharge Planning:   Plans to return  home with girlfriend

## 2015-05-04 ENCOUNTER — Encounter (HOSPITAL_COMMUNITY): Payer: Self-pay | Admitting: Cardiology

## 2015-05-04 ENCOUNTER — Inpatient Hospital Stay (HOSPITAL_COMMUNITY): Payer: Medicaid Other

## 2015-05-04 DIAGNOSIS — I5041 Acute combined systolic (congestive) and diastolic (congestive) heart failure: Secondary | ICD-10-CM

## 2015-05-04 LAB — GLUCOSE, CAPILLARY
GLUCOSE-CAPILLARY: 221 mg/dL — AB (ref 70–99)
Glucose-Capillary: 121 mg/dL — ABNORMAL HIGH (ref 70–99)
Glucose-Capillary: 116 mg/dL — ABNORMAL HIGH (ref 70–99)
Glucose-Capillary: 102 mg/dL — ABNORMAL HIGH (ref 70–99)

## 2015-05-04 LAB — HEPATIC FUNCTION PANEL
ALK PHOS: 74 U/L (ref 38–126)
ALT: 15 U/L — AB (ref 17–63)
AST: 16 U/L (ref 15–41)
Albumin: 3.3 g/dL — ABNORMAL LOW (ref 3.5–5.0)
Total Bilirubin: 0.9 mg/dL (ref 0.3–1.2)
Total Protein: 7.2 g/dL (ref 6.5–8.1)

## 2015-05-04 LAB — URINE MICROSCOPIC-ADD ON

## 2015-05-04 LAB — URINALYSIS, ROUTINE W REFLEX MICROSCOPIC
Bilirubin Urine: NEGATIVE
Glucose, UA: NEGATIVE mg/dL
Ketones, ur: 15 mg/dL — AB
LEUKOCYTES UA: NEGATIVE
NITRITE: NEGATIVE
Protein, ur: 30 mg/dL — AB
SPECIFIC GRAVITY, URINE: 1.036 — AB (ref 1.005–1.030)
Urobilinogen, UA: 0.2 mg/dL (ref 0.0–1.0)
pH: 5 (ref 5.0–8.0)

## 2015-05-04 LAB — BASIC METABOLIC PANEL
ANION GAP: 14 (ref 5–15)
BUN: 14 mg/dL (ref 6–20)
CALCIUM: 8.8 mg/dL — AB (ref 8.9–10.3)
CO2: 25 mmol/L (ref 22–32)
Chloride: 98 mmol/L — ABNORMAL LOW (ref 101–111)
Creatinine, Ser: 1.33 mg/dL — ABNORMAL HIGH (ref 0.61–1.24)
GFR calc Af Amer: >60 mL/min (ref >60)
GLUCOSE: 158 mg/dL — AB (ref 70–99)
POTASSIUM: 3.8 mmol/L (ref 3.5–5.1)
Sodium: 137 mmol/L (ref 135–145)

## 2015-05-04 LAB — CBC
HCT: 43.9 % (ref 39.0–52.0)
HEMOGLOBIN: 14.7 g/dL (ref 13.0–17.0)
MCH: 31.1 pg (ref 26.0–34.0)
MCHC: 33.5 g/dL (ref 30.0–36.0)
MCV: 92.8 fL (ref 78.0–100.0)
Platelets: 222 10*3/uL (ref 150–400)
RBC: 4.73 MIL/uL (ref 4.22–5.81)
RDW: 13.6 % (ref 11.5–15.5)
WBC: 8.1 10*3/uL (ref 4.0–10.5)

## 2015-05-04 MED ORDER — LIVING WELL WITH DIABETES BOOK
Freq: Once | Status: AC
  Administered 2015-05-04: 19:00:00
  Filled 2015-05-04: qty 1

## 2015-05-04 MED ORDER — INSULIN ASPART 100 UNIT/ML ~~LOC~~ SOLN: 0.0000 [IU] | Freq: Three times a day (TID) | SUBCUTANEOUS | Status: DC

## 2015-05-04 MED ORDER — INSULIN ASPART 100 UNIT/ML ~~LOC~~ SOLN: 0.0000 [IU] | Freq: Every day | SUBCUTANEOUS | Status: DC

## 2015-05-04 MED ORDER — HYDROMORPHONE HCL 1 MG/ML IJ SOLN
1.0000 mg | Freq: Three times a day (TID) | INTRAMUSCULAR | Status: DC | PRN
  Administered 2015-05-04 (×2): 1 mg via INTRAVENOUS
  Filled 2015-05-04 (×2): qty 1

## 2015-05-04 NOTE — Progress Notes (Signed)
Patient Name: Alan Diaz Date of Encounter: 05/04/2015  Principal Problem:   Acute combined systolic and diastolic heart failure, NYHA class 4 Active Problems:   Diabetes   HYPERCHOLESTEROLEMIA   TOBACCO ABUSE   ACID REFLUX DISEASE   CHF (congestive heart failure)   Essential hypertension   Primary Cardiologist: Dr Harrington Challenger  Patient Profile: 50 yo male w/ no previous cardiac issues, hx HTN, DM, tob use admitted 05/02 w/ pulm edema, trop 0.07. Dx ICM w/ EF 25% echo 05/02, cath w/ 2v intervention 05/03.  SUBJECTIVE: N&V all night, thinks he at bad meatloaf. L flank pain, has at times, denies dysuria, diarrhea. Sx resolved, feels better now. Not urinating much overnight. No chest pain or SOB  OBJECTIVE Filed Vitals:   05/04/15 0400 05/04/15 0522 05/04/15 0600 05/04/15 0700  BP: 119/77 105/73 102/72 121/89  Pulse:  98    Temp:  98.5 F (36.9 C)    TempSrc:  Oral    Resp:  19    Height:      Weight:  180 lb 8.9 oz (81.9 kg)    SpO2: 96% 96% 96% 98%    Intake/Output Summary (Last 24 hours) at 05/04/15 0716 Last data filed at 05/04/15 0321  Gross per 24 hour  Intake 1309.2 ml  Output    475 ml  Net  834.2 ml   Filed Weights   05/02/15 0912 05/03/15 0500 05/04/15 0522  Weight: 178 lb 8 oz (80.967 kg) 174 lb 6.4 oz (79.107 kg) 180 lb 8.9 oz (81.9 kg)    PHYSICAL EXAM General: Well developed, well nourished, male in no acute distress. Head: Normocephalic, atraumatic.  Neck: Supple without bruits, JVD 8 cm. Lungs:  Resp regular and unlabored, decreased BS  L>R bases. Heart: RRR, S1, S2, no S3, S4, soft murmur; no rub. Abdomen: Soft, non-tender, non-distended, BS + x 4.  Extremities: No clubbing, cyanosis, no edema. R radial cath site w/out ecchymosis/hematoma Neuro: Alert and oriented X 3. Moves all extremities spontaneously. Psych: Normal affect.  LABS: CBC:  Recent Labs  05/02/15 0330  WBC 6.6  NEUTROABS 3.0  HGB 12.6*  HCT 37.9*  MCV 93.1  PLT 158    INR:  Recent Labs  05/03/15 1343  INR 8.01   Basic Metabolic Panel:  Recent Labs  05/02/15 0330 05/03/15 0429  NA 137 137  K 3.3* 3.8  CL 108 101  CO2 24 22  GLUCOSE 101* 99  BUN 13 11  CREATININE 1.27* 1.23  CALCIUM 8.6* 9.2   Liver Function Tests:No results for input(s): AST, ALT, ALKPHOS, BILITOT, PROT, ALBUMIN in the last 72 hours. Cardiac Enzymes:  Recent Labs  05/02/15 1225 05/02/15 1842 05/02/15 2227  TROPONINI 0.05* 0.04* 0.05*   BNP:  B NATRIURETIC PEPTIDE  Date/Time Value Ref Range Status  05/03/2015 04:29 AM 606.9* 0.0 - 100.0 pg/mL Final  05/02/2015 03:30 AM 745.5* 0.0 - 100.0 pg/mL Final   Hemoglobin A1C:  Recent Labs  05/02/15 1225  HGBA1C 7.0*   Fasting Lipid Panel:  Recent Labs  05/03/15 0429  CHOL 248*  HDL 41  LDLCALC 182*  TRIG 124  CHOLHDL 6.0   TELE: SR, ST       ECHO: 05/02/2015 Study Conclusions - Left ventricle: The cavity size was mildly dilated. Wall thickness was increased in a pattern of mild LVH. Systolic function was severely reduced. The estimated ejection fraction was in the range of 25% to 30%. There is moderate hypokinesis of the mid-apicalanteroseptal  myocardium. Doppler parameters are consistent with abnormal left ventricular relaxation (grade 1 diastolic dysfunction).  Cardiac Cath: 05/03/2015 Conclusion     Mid LAD lesion, 40% stenosed.  Prox RCA to Mid RCA lesion, 40% stenosed.  Normal right heart and LV filling pressures.  Ost LAD to Prox LAD lesion, 90% stenosed. There is a 0% residual stenosis post intervention.  A drug-eluting stent was placed.  Mid RCA to Dist RCA lesion, 95% stenosed. There is a 0% residual stenosis post intervention.  A drug-eluting stent was placed.  Recommendation. Continue Brilinta indefinitely since patient is ASA allergic. Anticipate DC in am. Aggressive risk factor modification.     Coronary Findings    Dominance: Right   Left Anterior  Descending   . Ost LAD to Prox LAD lesion, 90% stenosed. tubular .   Marland Kitchen PCI: The pre-interventional distal flow is normal (TIMI 3). Pre-stent angioplasty was performed. A drug-eluting stent was placed. The strut is apposed. Post-stent angioplasty was performed. 4.0 mm balloon Maximum pressure: 9 atm. The post-interventional distal flow is normal (TIMI 3). The intervention was successful. No complications occurred at this lesion. IVUS was performed on the lesion.  . There is no residual stenosis post intervention.     . Mid LAD lesion, 40% stenosed. eccentric .     Ramus Intermedius  The vessel was , is small .     Left Circumflex   . Second Obtuse Marginal Branch   The vessel is large in size.     Right Coronary Artery   . Prox RCA to Mid RCA lesion, 40% stenosed. diffuse .   Marland Kitchen Mid RCA to Dist RCA lesion, 95% stenosed. ulcerative , and has left-to-right collateral flow.   Marland Kitchen PCI: The pre-interventional distal flow is decreased (TIMI 2). Pre-stent angioplasty was performed. A drug-eluting stent was placed. Post-stent angioplasty was performed. 3.5 mm balloon Maximum pressure: 16 atm. The post-interventional distal flow is normal (TIMI 3). The intervention was successful. No complications occurred at this lesion.  . There is no residual stenosis post intervention.     . Inferior Septal   Inf Sept filled by collaterals from 3rd Sept.       Right Heart Pressures LV EDP is normal.    Left Heart    Mitral Valve There is no mitral valve stenosis.   Aortic Valve There is no aortic valve stenosis.     Radiology/Studies: Dg Chest 2 View  05/02/2015   CLINICAL DATA:  Dyspnea and nonproductive cough for 1 day  EXAM: CHEST  2 VIEW  COMPARISON:  09/22/2014  FINDINGS: There is moderate hyperinflation. There is no confluent airspace consolidation. There is mild interstitial thickening superimposed on the chronic changes and this could represent interstitial pneumonitis or interstitial fluid  superimposed on COPD. There are no pleural effusions. Hilar, mediastinal and cardiac contours are unremarkable and unchanged.  IMPRESSION: Interstitial fluid or thickening superimposed on chronic emphysematous hyperinflation. Interstitial pneumonitis or interstitial edema associated with mild CHF would be considerations.   Electronically Signed   By: Andreas Newport M.D.   On: 05/02/2015 03:34      Current Medications:  . atorvastatin  80 mg Oral q1800  . carvedilol  6.25 mg Oral BID WC  . lisinopril  10 mg Oral Daily  . pantoprazole  40 mg Oral Daily  . ticagrelor  90 mg Oral BID   . nitroGLYCERIN 5 mcg/min (05/04/15 0330)    ASSESSMENT AND PLAN: Principal Problem:   Acute combined systolic and diastolic  heart failure, NYHA class 4 - Lasix d/c'd for cath, I/O not complete for last 24 hours, due to N&V, volume may be low. - will need diuretic at d/c, may not need to start for a day or 2  Active Problems:   Diabetes - PTA metformin, restart in a day or 2    HYPERCHOLESTEROLEMIA - statin    TOBACCO ABUSE - cessation encouraged    ACID REFLUX DISEASE - continue rx    Essential hypertension - last pm, BP as high as 189/111 - during acute illness - improved now, continue to wean IV NTG - on ACE, f/u on labs, may need to increase - on BB, no dose increase 2nd HR 50s at times - MD advise on hydralazine/nitrates    N&V - no diarrhea - had L flank pain but no tenderness or dysuria - sx have improved - start with clears, advance PO intake as tolerated  Plan - d/c when medically stable, MD advise on Lifevest  Signed, Barrett, Suanne Marker , PA-C 7:16 AM 05/04/2015  History and all data above reviewed.  Patient examined.  I agree with the findings as above. He is nauseated this AM.  No chest pain.  The patient exam reveals COR: RRR  ,  Lungs: Clear  ,  Abd: Positive bowel sounds, no rebound no guarding, Ext No edema, right wrist OK.   .  All available labs, radiology testing,  previous records reviewed. Agree with documented assessment and plan.  CM:  Do not titrate meds today.  BP is low.  Continue current therapy.  Probably will need a low dose of Lasix starting in the AM.  CAD:  Post two vessel PCI.  Continue current meds.    Home in the AM.  Nauseated and not feeling well.  Need also to stop the NTG.   Minus Breeding  8:49 AM  05/04/2015

## 2015-05-04 NOTE — Care Management Note (Signed)
Case Management Note  Patient Details  Name: Alan Diaz MRN: [id number] Date of Birth: [birth year]  Subjective/Objective:   Pt admitted for Acute combined systolic and diastolic heart failure.                  Action/Plan: CM did call to [facility] on [address] to see if Marden Noble is available and it is. Pt should have $3.00 co pay. CM did ask floor RN to provide pt with 30 day free Brilinta Card  Expected Discharge Date:  05/06/15               Expected Discharge Plan:     In-House Referral:     Discharge planning Services  CM Consult  Post Acute Care Choice:    Choice offered to:     DME Arranged:    DME Agency:     HH Arranged:    HH Agency:     Status of Service:  In process, will continue to follow  Medicare Important Message Given:    Date Medicare IM Given:    Medicare IM give by:    Date Additional Medicare IM Given:    Additional Medicare Important Message give by:     If discussed at Long Length of Stay Meetings, dates discussed:    Additional Comments: CM did see a note stating pt may need life vest. CM will continue to monitor.   Gala Lewandowsky, RN 05/04/2015, 10:51 AM

## 2015-05-04 NOTE — Progress Notes (Signed)
CARDIAC REHAB PHASE I   PRE:  Rate/Rhythm: 81 SR  BP:  Supine: 114/69  Sitting:   Standing:    SaO2:   MODE:  Ambulation: 800 ft   POST:  Rate/Rhythm: 97%RA  BP:  Supine: 126/77  Sitting:   Standing:    SaO2:  1330-1410 Pt walked 800 ft with steady gait. No CP. Tolerated well. Began ed. Discussed CHF and pt able to answer per teach back. Discussed HGA1C at 7.0. Pt stated he had stopped his med. Has been drinking regular soda and tea. Discussed watching carbs sodium and fats. Gave stent card and discussed importance of brililnta. Discussed smoking cessation and fake cigarette and smoking cessation handouts given. Plans on quitting cold [place]. Will follow up tomorrow for completion of ed.   Luetta Nutting, RN BSN  05/04/2015 2:06 PM

## 2015-05-05 ENCOUNTER — Encounter (HOSPITAL_COMMUNITY): Payer: Self-pay | Admitting: Physician Assistant

## 2015-05-05 ENCOUNTER — Telehealth: Payer: Self-pay | Admitting: Internal Medicine

## 2015-05-05 DIAGNOSIS — E785 Hyperlipidemia, unspecified: Secondary | ICD-10-CM | POA: Diagnosis present

## 2015-05-05 DIAGNOSIS — I25110 Atherosclerotic heart disease of native coronary artery with unstable angina pectoris: Secondary | ICD-10-CM

## 2015-05-05 DIAGNOSIS — I214 Non-ST elevation (NSTEMI) myocardial infarction: Secondary | ICD-10-CM

## 2015-05-05 DIAGNOSIS — K219 Gastro-esophageal reflux disease without esophagitis: Secondary | ICD-10-CM | POA: Diagnosis present

## 2015-05-05 DIAGNOSIS — I2510 Atherosclerotic heart disease of native coronary artery without angina pectoris: Secondary | ICD-10-CM | POA: Diagnosis present

## 2015-05-05 HISTORY — DX: Non-ST elevation (NSTEMI) myocardial infarction: I21.4

## 2015-05-05 LAB — BASIC METABOLIC PANEL
Anion gap: 9 (ref 5–15)
BUN: 14 mg/dL (ref 6–20)
CALCIUM: 8.7 mg/dL — AB (ref 8.9–10.3)
CO2: 27 mmol/L (ref 22–32)
CREATININE: 1.26 mg/dL — AB (ref 0.61–1.24)
Chloride: 100 mmol/L — ABNORMAL LOW (ref 101–111)
GFR calc Af Amer: >60 mL/min (ref >60)
GLUCOSE: 96 mg/dL (ref 70–99)
Potassium: 3.5 mmol/L (ref 3.5–5.1)
Sodium: 136 mmol/L (ref 135–145)

## 2015-05-05 LAB — GLUCOSE, CAPILLARY: GLUCOSE-CAPILLARY: 98 mg/dL (ref 70–99)

## 2015-05-05 MED ORDER — ATORVASTATIN CALCIUM 80 MG PO TABS: 80.0000 mg | ORAL_TABLET | Freq: Every day | ORAL | Status: DC

## 2015-05-05 MED ORDER — NITROGLYCERIN 0.4 MG SL SUBL: 0.4000 mg | SUBLINGUAL_TABLET | SUBLINGUAL | Status: DC | PRN

## 2015-05-05 MED ORDER — CARVEDILOL 6.25 MG PO TABS: 6.2500 mg | ORAL_TABLET | Freq: Two times a day (BID) | ORAL | Status: DC

## 2015-05-05 MED ORDER — FUROSEMIDE 20 MG PO TABS: 20.0000 mg | ORAL_TABLET | Freq: Every day | ORAL | Status: DC

## 2015-05-05 MED ORDER — TICAGRELOR 90 MG PO TABS: 90.0000 mg | ORAL_TABLET | Freq: Two times a day (BID) | ORAL | Status: DC

## 2015-05-05 MED ORDER — LISINOPRIL 10 MG PO TABS: 10.0000 mg | ORAL_TABLET | Freq: Every day | ORAL | Status: DC

## 2015-05-05 MED ORDER — METFORMIN HCL 500 MG PO TABS: 500.0000 mg | ORAL_TABLET | Freq: Two times a day (BID) | ORAL | Status: DC

## 2015-05-05 NOTE — Telephone Encounter (Signed)
New message     tcm appt on Katie T schedule for 5.12.2016

## 2015-05-05 NOTE — Discharge Instructions (Signed)

## 2015-05-05 NOTE — Progress Notes (Signed)
[id number] Pt did not want to walk this morning. Stated he will walk after bath. Education completed with pt who voiced understanding. Reviewed NTG use. Has brilinta card. Discussed CRP 2 and pt declined at this time. Wants to ex on his own. Left brochure in case he changes his mind. Luetta Nutting RN BSN 05/05/2015 8:28 AM

## 2015-05-05 NOTE — Progress Notes (Signed)
SUBJECTIVE:  No pain.  No SOB.  Ambulated.     PHYSICAL EXAM Filed Vitals:   05/04/15 2020 05/04/15 2355 05/05/15 0021 05/05/15 0449  BP: 104/57 125/76  140/90  Pulse: 73 72  65  Temp: 98.4 F (36.9 C) 98.1 F (36.7 C)  98.1 F (36.7 C)  TempSrc: Oral Oral  Oral  Resp: Height:      Weight:   181 lb 14.1 oz (82.5 kg)   SpO2: 94% 94%  95%   General:  No distress Lungs:  Clear Heart:  RRR Abdomen:  Positive bowel sounds, no rebound no guarding Extremities:  No edema   LABS:  Results for orders placed or performed during the hospital encounter of 05/02/15 (from the past 24 hour(s))  Glucose, capillary     Status: Abnormal   Collection Time: 05/04/15 12:34 PM  Result Value Ref Range   Glucose-Capillary 116 (H) 70 - 99 mg/dL  Urinalysis, Routine w reflex microscopic     Status: Abnormal   Collection Time: 05/04/15  1:14 PM  Result Value Ref Range   Color, Urine YELLOW YELLOW   APPearance CLEAR CLEAR   Specific Gravity, Urine 1.036 (H) 1.005 - 1.030   pH 5.0 5.0 - 8.0   Glucose, UA NEGATIVE NEGATIVE mg/dL   Hgb urine dipstick TRACE (A) NEGATIVE   Bilirubin Urine NEGATIVE NEGATIVE   Ketones, ur 15 (A) NEGATIVE mg/dL   Protein, ur 30 (A) NEGATIVE mg/dL   Urobilinogen, UA 0.2 0.0 - 1.0 mg/dL   Nitrite NEGATIVE NEGATIVE   Leukocytes, UA NEGATIVE NEGATIVE  Urine microscopic-add on     Status: Abnormal   Collection Time: 05/04/15  1:14 PM  Result Value Ref Range   Squamous Epithelial / LPF RARE RARE   WBC, UA 0-2 <3 WBC/hpf   RBC / HPF 0-2 <3 RBC/hpf   Bacteria, UA FEW (A) RARE   Casts HYALINE CASTS (A) NEGATIVE   Urine-Other MUCOUS PRESENT   Glucose, capillary     Status: Abnormal   Collection Time: 05/04/15  6:28 PM  Result Value Ref Range   Glucose-Capillary 102 (H) 70 - 99 mg/dL  Glucose, capillary     Status: Abnormal   Collection Time: 05/04/15  9:28 PM  Result Value Ref Range   Glucose-Capillary 221 (H) 70 - 99 mg/dL  Basic metabolic panel      Status: Abnormal   Collection Time: 05/05/15  3:16 AM  Result Value Ref Range   Sodium 136 135 - 145 mmol/L   Potassium 3.5 3.5 - 5.1 mmol/L   Chloride 100 (L) 101 - 111 mmol/L   CO2 27 22 - 32 mmol/L   Glucose, Bld 96 70 - 99 mg/dL   BUN 14 6 - 20 mg/dL   Creatinine, Ser 4.09 (H) 0.61 - 1.24 mg/dL   Calcium 8.7 (L) 8.9 - 10.3 mg/dL   GFR calc non Af Amer >60 >60 mL/min   GFR calc Af Amer >60 >60 mL/min   Anion gap 9 5 - 15  Glucose, capillary     Status: None   Collection Time: 05/05/15  6:24 AM  Result Value Ref Range   Glucose-Capillary 98 70 - 99 mg/dL    Intake/Output Summary (Last 24 hours) at 05/05/15 0729 Last data filed at 05/05/15 0500  Gross per 24 hour  Intake    680 ml  Output    725 ml  Net    -45 ml  ASSESSMENT AND PLAN:  CAD:   Continue medical management.  He is allergic to ASA.  Needs to continue Brilinta   CARDIOMYOPATHY:  Not high risk so I will not order a life vest at this point.   Continue current meds.  He will need transition of care follow.   DYSLIPIDEMIA:  LDL 182.  On Lipitor.  Fayrene Fearing [facility] 05/05/2015 7:29 AM

## 2015-05-05 NOTE — Discharge Summary (Signed)
Discharge Summary   Patient ID: Alan Diaz MRN: [id number], DOB/AGE: Nov 28, 1965 50 y.o. Admit date: 05/02/2015 D/C date:     05/05/2015  Primary Cardiologist: Dr. Harrington Challenger.   Principal Problem:   Acute combined systolic and diastolic heart failure, NYHA class 4 Active Problems:   NSTEMI (non-ST elevated myocardial infarction)   Diabetes   TOBACCO ABUSE   Essential hypertension   GERD (gastroesophageal reflux disease)   HLD (hyperlipidemia)   CAD (coronary artery disease)    Admission Dates: 05/02/15-05/05/15 Discharge Diagnosis: Acute on chronic systolic/diastolic CHF and NSTEMI s/p DES to pLAD and dRCA  HPI: Alan Diaz is a 50 y.o. male with a history of HTN, DM, GERD, tobacco abuse, medical non compliance and no past cardiac history who presented to [facility] on 05/02/15 with cough and SOB and found to have acute pulmonary edema and mild NSTEMI.  The day prior to admission he woke up not feeling well. He noticed some SOB and wheezing.He just "couldn't get comfortable" and noted some mild chest tightness. This continued to worsen and he eventually presented to the ED. He has been treated in the past for DM and HTN but has not been taking any medications.    On arrival to ER CXR showed pulmonary edema. He was given IV lasix with signif response. Breathing much better Chest tightness gone    Hospital Course  Ischemic cardiomyopathy/acute on chronic systolic/diastolic CHF: -- 2D ECHO 7/0/26 with EF 25-30%, mild LV dilation. Mild LVH, mod HKof the mid-apicalanteroseptal myocardium, G1DD -- He presented in acute pulmonary edema, BNP 745, and diuresed well on IV lasix. Net neg 1.5 L. Discharge weight 181 lbs. -- Not felt to be high risk so LifeVest not ordered at this point.Will need repeat 2D ECHO in 3 months. -- Continue current meds with lisinopril $RemoveBeforeD'10mg'WKFyHvejllUuEk$  and coreg 6.$RemoveBef'25mg'vyMkrLLaoT$  BID -- Appears euvolemic. Will discharge him on Lasix $Remove'20mg'nyQoPUT$  qd. Will re-evulate in 1 week at [facility] appt. BMET at that  time.   CAD/NSTEMI:Mildly elevated troponin. 0.07--> 0.05--> 0.04--> 0.05.  -- Given severity of LV dysfunction he was set up for [facility] which revealed  Mid LAD lesion, 40% stenosed.  Prox RCA to Mid RCA lesion, 40% stenosed.  Normal right heart and LV filling pressures.  Ost LAD to Prox LAD lesion, 90% stenosed. There is a 0% residual stenosis post intervention.  A drug-eluting stent was placed.  Mid RCA to Dist RCA lesion, 95% stenosed. There is a 0% residual stenosis post intervention.  A drug-eluting stent was placed. -- Recommendation. Continue Brilinta indefinitely since patient is ASA allergic. Continue BB, statin and ACE.  -- Denied participation in cardiac rehab  HLD: LDL 182.Continue Lipitor.   Tobacco abuse: does not need nicotine patches and will quit on his own.   DM: A1C 7.0, will need to restart Metformin $RemoveBeforeDEI'500mg'mVcoYULdrxRAkbXS$  BID tomorrow AM (48 hours post contrast dye exposure). -- Further adjustment by PCP- [facility]  HTN: He had some issues controlling his BP while inpatient and required NTG gtt. He is now doing well on  lisinopril $RemoveBef'10mg'XrRaoHAqzl$  and coreg 6.$RemoveBef'25mg'CaQcoMuEUy$  BID   The patient has had an uncomplicated hospital course and is recovering well. The radial catheter site is stable. He has been seen by Dr. Percival Spanish today and deemed ready for discharge home. All follow-up appointments have been scheduled. Smoking cessation was disscussed in length. A written RX for a 30 day free supply of Brilinta was provided for the patient. A work excuse note was  provided as well. Discharge medications are listed below.   Discharge Vitals: Blood pressure 140/90, pulse 65, temperature 98.1 F (36.7 C), temperature source Oral, resp. rate 17, height 6' (1.829 m), weight 181 lb 14.1 oz (82.5 kg), SpO2 95 %.  Labs: Lab Results  Component Value Date   WBC 8.1 05/04/2015   HGB 14.7 05/04/2015   HCT 43.9 05/04/2015   MCV 92.8 05/04/2015   PLT 222 05/04/2015     Recent Labs Lab  05/04/15 0700 05/05/15 0316  NA  --  136  K  --  3.5  CL  --  100*  CO2  --  27  BUN  --  14  CREATININE  --  1.26*  CALCIUM  --  8.7*  PROT 7.2  --   BILITOT 0.9  --   ALKPHOS 74  --   ALT 15*  --   AST 16  --   GLUCOSE  --  96    Recent Labs  05/02/15 1225 05/02/15 1842 05/02/15 2227  TROPONINI 0.05* 0.04* 0.05*   Lab Results  Component Value Date   CHOL 248* 05/03/2015   HDL 41 05/03/2015   LDLCALC 182* 05/03/2015   TRIG 124 05/03/2015     Diagnostic Studies/Procedures   Dg Chest 2 View  05/02/2015   CLINICAL DATA:  Dyspnea and nonproductive cough for 1 day  EXAM: CHEST  2 VIEW  COMPARISON:  09/22/2014  FINDINGS: There is moderate hyperinflation. There is no confluent airspace consolidation. There is mild interstitial thickening superimposed on the chronic changes and this could represent interstitial pneumonitis or interstitial fluid superimposed on COPD. There are no pleural effusions. Hilar, mediastinal and cardiac contours are unremarkable and unchanged.  IMPRESSION: Interstitial fluid or thickening superimposed on chronic emphysematous hyperinflation. Interstitial pneumonitis or interstitial edema associated with mild CHF would be considerations.   Electronically Signed   By: Andreas Newport M.D.   On: 05/02/2015 03:34   Dg Abd 1 View  05/04/2015   CLINICAL DATA:  Nausea, vomiting for 6 months. Generalized anterior abdominal pain.  EXAM: ABDOMEN - 1 VIEW  COMPARISON:  06/28/2014  FINDINGS: Moderate stool throughout the colon. Slight prominence of central small bowel loops, nonspecific. No organomegaly, suspicious calcification or free air. No acute bony abnormality.  IMPRESSION: Slight prominence of central small bowel loops without convincing evidence of obstruction.   Electronically Signed   By: Rolm Baptise M.D.   On: 05/04/2015 12:19   LHC 05/03/15 Conclusion    Mid LAD lesion, 40% stenosed.  Prox RCA to Mid RCA lesion, 40% stenosed.  Normal right heart and  LV filling pressures.  Ost LAD to Prox LAD lesion, 90% stenosed. There is a 0% residual stenosis post intervention.  A drug-eluting stent was placed.  Mid RCA to Dist RCA lesion, 95% stenosed. There is a 0% residual stenosis post intervention.  A drug-eluting stent was placed.  Recommendation. Continue Brilinta indefinitely since patient is ASA allergic. Anticipate DC in am. Aggressive risk factor modification.   Coronary Findings     Dominance: Right    Left Anterior Descending   . Ost LAD to Prox LAD lesion, 90% stenosed. tubular .   Marland Kitchen PCI: The pre-interventional distal flow is normal (TIMI 3). Pre-stent angioplasty was performed. A drug-eluting stent was placed. The strut is apposed. Post-stent angioplasty was performed. 4.0 mm balloon Maximum pressure: 9 atm. The post-interventional distal flow is normal (TIMI 3). The intervention was successful. No complications occurred at this lesion. IVUS  was performed on the lesion.  . There is no residual stenosis post intervention.     . Mid LAD lesion, 40% stenosed. eccentric .      Ramus Intermedius  The vessel was , is small .      Left Circumflex   . Second Obtuse Marginal Branch   The vessel is large in size.      Right Coronary Artery   . Prox RCA to Mid RCA lesion, 40% stenosed. diffuse .   Marland Kitchen Mid RCA to Dist RCA lesion, 95% stenosed. ulcerative , and has left-to-right collateral flow.   Marland Kitchen PCI: The pre-interventional distal flow is decreased (TIMI 2). Pre-stent angioplasty was performed. A drug-eluting stent was placed. Post-stent angioplasty was performed. 3.5 mm balloon Maximum pressure: 16 atm. The post-interventional distal flow is normal (TIMI 3). The intervention was successful. No complications occurred at this lesion.  . There is no residual stenosis post intervention.     . Inferior Septal   Inf Sept filled by collaterals from 3rd Sept.         Right Heart Pressures LV EDP is normal.     Left Heart     Mitral  Valve There is no mitral valve stenosis.    Aortic Valve There is no aortic valve stenosis.     Coronary Diagrams     Diagnostic Diagram              Post-Intervention Diagram               2D ECHO: 05/02/2015 LV EF: 25%-30% Study Conclusions - Left ventricle: The cavity size was mildly dilated. Wall   thickness was increased in a pattern of mild LVH. Systolic   function was severely reduced. The estimated ejection fraction   was in the range of 25% to 30%. There is moderate hypokinesis of   the mid-apicalanteroseptal myocardium. Doppler parameters are   consistent with abnormal left ventricular relaxation (grade 1   diastolic dysfunction).    Discharge Medications     Medication List    TAKE these medications        atorvastatin 80 MG tablet  Commonly known as:  LIPITOR  Take 1 tablet (80 mg total) by mouth daily at 6 PM.     carvedilol 6.25 MG tablet  Commonly known as:  COREG  Take 1 tablet (6.25 mg total) by mouth 2 (two) times daily with a meal.     furosemide 20 MG tablet  Commonly known as:  LASIX  Take 1 tablet (20 mg total) by mouth daily.     lisinopril 10 MG tablet  Commonly known as:  PRINIVIL,ZESTRIL  Take 1 tablet (10 mg total) by mouth daily.     metFORMIN 500 MG tablet  Commonly known as:  GLUCOPHAGE  Take 1 tablet (500 mg total) by mouth 2 (two) times daily with a meal.  Start taking on:  05/06/2015     nitroGLYCERIN 0.4 MG SL tablet  Commonly known as:  NITROSTAT  Place 1 tablet (0.4 mg total) under the tongue every 5 (five) minutes as needed for chest pain.     ondansetron 4 MG disintegrating tablet  Commonly known as:  ZOFRAN-ODT  Take 4 mg by mouth every 4 (four) hours as needed for nausea or vomiting.     oxyCODONE-acetaminophen 5-325 MG per tablet  Commonly known as:  PERCOCET  Take 1 tablet by mouth every 4 (four) hours as needed for severe pain.  pantoprazole 40 MG tablet  Commonly known as:  PROTONIX  Take 1 tablet (40  mg total) by mouth daily.     promethazine 25 MG tablet  Commonly known as:  PHENERGAN  Take 1 tablet (25 mg total) by mouth every 8 (eight) hours as needed for nausea or vomiting.     ticagrelor 90 MG Tabs tablet  Commonly known as:  BRILINTA  Take 1 tablet (90 mg total) by mouth 2 (two) times daily.        Disposition   The patient will be discharged in stable condition to home.  Follow-up Information    Follow up with Eileen Stanford, PA-C On 05/12/2015.   Specialty:  Cardiology   Why:  @ 9AM   Contact information:   [place] [id number] [phone]       Follow up with [facility].   Why:  Please follow up with your PCP in the next couple weeks.    Contact information:   [place] [phone]        Duration of Discharge Encounter: Greater than 30 minutes including physician and PA time.  Signed, Grandville Silos, KATHRYN R PA-C 05/05/2015, 10:00 AM   Patient seen and examined.  Plan as discussed in my rounding note for today and outlined above. Jeneen Rinks [facility]  05/05/2015  11:34 AM

## 2015-05-06 NOTE — Telephone Encounter (Signed)
Left message on machine for pt to contact the office.   

## 2015-05-09 NOTE — Telephone Encounter (Signed)
Left message for patient to call the office for follow-up with hospital discharge

## 2015-05-10 NOTE — Telephone Encounter (Signed)
Patient contacted regarding discharge from [facility] on 5/5.  Patient understands to follow up with provider Carlean Jews, PA on Thursday 5/12 at 9:00 at [facility]. Patient understands discharge instructions? Yes Patient understands medications and regiment? Yes Patient understands to bring all medications to this visit? Yes  Patient denies complaints or questions and verbalizes understanding of plan of care.

## 2015-05-11 NOTE — Progress Notes (Signed)
Cardiology Office Note   Date:  05/12/2015   ID:  Alan Diaz, DOB 1965-05-06, MRN [id number]  PCP:  No PCP Per Patient  Cardiologist: Dr Tenny Craw  Post hospital follow up- CHF and CAD    History of Present Illness: Alan Diaz is a 50 y.o. male with a history of HTN, DM, GERD, tobacco abuse, medical non compliance and recent hospitalization for new onset acute systolic/diastolic CHF and CAD/NSTEMI s/p DES to pLAD and dRCA who presents for post hospital follow up.   The day prior to admission he woke up not feeling well. He noticed some SOB and wheezing.He just "couldn't get comfortable" and noted some mild chest tightness. This continued to worsen and he eventually presented to the ED. He has been treated in the past for DM and HTN but has not been taking any medications.On arrival to ER CXR showed pulmonary edema. He was given IV lasix with signif response.A  2D ECHO 05/02/15 revealed EF 25-30%, mild LV dilation. Mild LVH, mod HKofthe mid-apicalanteroseptal myocardium, G1DD.  Not felt to be high risk so LifeVest was not placed. He was placed on lisinopril 10mg  and coreg 6.25mg  BID as well as lasix 20mg  qd. His troponin was also noted to be mildly elevated ( . 0.07--> 0.05--> 0.04--> 0.05.). Given severity of LV dysfunction he was set up for [facility] on 05/03/15 which revealed  Mid LAD lesion, 40% stenosed.  Prox RCA to Mid RCA lesion, 40% stenosed.  Normal right heart and LV filling pressures.  Ost LAD to Prox LAD lesion, 90% stenosed s/p DES  Mid RCA to Dist RCA lesion, 95% stenosed s/p DES He was placed on Brilinta indefinitely since patient is ASA allergic.  He had some issues controlling his BP while inpatient and required NTG gtt. He then stabilized and did well on lisinopril 10mg  and coreg 6.25mg  BID  Today he presents for post hospital follow up. He is doing quite well. He has no CP or SOB. No orthopnea, PNE or LE edema. No blood in his stool or urine. Taking all of his  medications. Wants to know when he can have sexual intercourse again and work out.   Past Medical History  Diagnosis Date  . Diabetes mellitus   . Hypertension   . GERD (gastroesophageal reflux disease)   . Ischemic cardiomyopathy     a. 2D ECHO 05/02/15 with EF 25-30%, mild LV dilation. Mild LVH, mod HKof the mid-apicalanteroseptal myocardium, G1DD  . CAD (coronary artery disease)     a. NSTEMI s/p DES to pLAD and dRCA  . Tobacco abuse   . HLD (hyperlipidemia)     Past Surgical History  Procedure Laterality Date  . Wisdom tooth extraction      bottom LT, top RT  . Cardiac catheterization N/A 05/03/2015    Procedure: Right/Left Heart Cath and Coronary Angiography;  Surgeon: Alan M Swaziland, MD;  Location: [facility] INVASIVE CV LAB CUPID;  Service: Cardiovascular;  Laterality: N/A;  . Percutaneous coronary stent intervention (pci-s)  05/03/2015    Procedure: Percutaneous Coronary Stent Intervention (Pci-S);  Surgeon: Alan M Swaziland, MD;  Location: [facility] INVASIVE CV LAB CUPID;  Service: Cardiovascular;;  Prox LAD  . Cardiac catheterization N/A 05/03/2015    Procedure: Intravascular Ultrasound/IVUS;  Surgeon: Alan M Swaziland, MD;  Location: [facility] INVASIVE CV LAB CUPID;  Service: Cardiovascular;  Laterality: N/A;  . Cardiac catheterization N/A 05/03/2015    Procedure: Coronary Stent Intervention;  Surgeon: Alan M Swaziland, MD;  Location: [facility] INVASIVE  CV LAB CUPID;  Service: Cardiovascular;  Laterality: N/A;  rca     Current Outpatient Prescriptions  Medication Sig Dispense Refill  . atorvastatin (LIPITOR) 80 MG tablet Take 1 tablet (80 mg total) by mouth daily at 6 PM. 30 tablet 11  . carvedilol (COREG) 6.25 MG tablet Take 1 tablet (6.25 mg total) by mouth 2 (two) times daily with a meal. 60 tablet 11  . furosemide (LASIX) 20 MG tablet Take 1 tablet (20 mg total) by mouth daily. 30 tablet 11  . lisinopril (PRINIVIL,ZESTRIL) 10 MG tablet Take 1 tablet (10 mg total) by mouth daily. 30 tablet 11  . metFORMIN  (GLUCOPHAGE) 500 MG tablet Take 1 tablet (500 mg total) by mouth 2 (two) times daily with a meal. 60 tablet 11  . nitroGLYCERIN (NITROSTAT) 0.4 MG SL tablet Place 1 tablet (0.4 mg total) under the tongue every 5 (five) minutes as needed for chest pain. 25 tablet 12  . ticagrelor (BRILINTA) 90 MG TABS tablet Take 1 tablet (90 mg total) by mouth 2 (two) times daily. 60 tablet 11   No current facility-administered medications for this visit.    Allergies:   Aspirin and Other    Social History:  The patient  reports that he quit smoking 9 days ago. His smoking use included Cigarettes. He smoked 0.30 packs per day. He does not have any smokeless tobacco history on file. He reports that he drinks alcohol. He reports that he uses illicit drugs (Marijuana).   Family History:  The patient's family history includes Breast cancer in his mother; Diabetes in his brother and sister; Hypertension in his brother and sister.    ROS:  Please see the history of present illness.   Otherwise, review of systems are positive for none.   All other systems are reviewed and negative.    PHYSICAL EXAM: VS:  BP 120/80 mmHg  Pulse 80  Ht 6' (1.829 m)  Wt 177 lb 12.8 oz (80.65 kg)  BMI 24.11 kg/m2  SpO2 98% , BMI Body mass index is 24.11 kg/(m^2). GEN: Well nourished, well developed, in no acute distress HEENT: normal Neck: no JVD, carotid bruits, or masses Cardiac: RRR; no murmurs, rubs, or gallops,no edema  Respiratory:  clear to auscultation bilaterally, normal work of breathing GI: soft, nontender, nondistended, + BS MS: no deformity or atrophy Skin: warm and dry, no rash Neuro:  Strength and sensation are intact Psych: euthymic mood, full affect   EKG:  EKG is not ordered today.    Recent Labs: 05/03/2015: B Natriuretic Peptide 606.9* 05/04/2015: ALT 15*; Hemoglobin 14.7; Platelets 222 05/05/2015: BUN 14; Creatinine 1.26*; Potassium 3.5; Sodium 136    Lipid Panel    Component Value Date/Time    CHOL 248* 05/03/2015 0429   TRIG 124 05/03/2015 0429   HDL 41 05/03/2015 0429   CHOLHDL 6.0 05/03/2015 0429   VLDL 25 05/03/2015 0429   LDLCALC 182* 05/03/2015 0429      Wt Readings from Last 3 Encounters:  05/12/15 177 lb 12.8 oz (80.65 kg)  05/05/15 181 lb 14.1 oz (82.5 kg)  08/22/14 180 lb (81.647 kg)      Other studies Reviewed: Additional studies/ records that were reviewed today include: LHC and 2d ECHO Review of the above records demonstrates:  --  2D ECHO (05/02/15) EF 25-30%, mild LV dilation. Mild LVH, mod HKofthe mid-apicalanteroseptal myocardium, G1DD.  -- R/LHC (05/03/15) Mid LAD lesion, 40% stenosed, Prox RCA to Mid RCA lesion, 40% stenosed, Normal right  heart and LV filling pressures, Ost LAD to Prox LAD lesion, 90% stenosed s/p DES, Mid RCA to Dist RCA lesion, 95% stenosed s/p DES   ASSESSMENT AND PLAN:  Giovanne Nickolson is a 50 y.o. male with a history of HTN, DM, GERD, tobacco abuse, medical non compliance and recent hospitalization for acute on chronic systolic/diastolic CHF and CAD/NSTEMI s/p DES to pLAD and dRCA who presents for post hospital follow up.   Ischemic cardiomyopathy/acute on chronic systolic/diastolic CHF: -- 2D ECHO 05/02/15 with EF 25-30%, mild LV dilation. Mild LVH, mod HKofthe mid-apicalanteroseptal myocardium, G1DD -- Discharge weight 181 lbs. Weight 177 lbs today. Appears euvolemic. --Will need repeat 2D ECHO in 3 months. -- Continue lisinopril  and coreg 6.25mg  BID. Discharged on Lasix  qd. Continue this. BMET today.  CAD/NSTEMI s/p DES to pLAD and dRCA (05/03/15) -- Continue Brilinta indefinitely since patient is ASA allergic. Continue BB, statin and ACE.  -- He initially denied cardiac rehab but now is interested. Will have this set up.  -- Groin site stable.  HLD: LDL 182.Continue Lipitor.   Tobacco abuse: he has quit smoking   DM: A1C 7.0, Continue metformin -- Further adjustment by PCP- [facility]  HTN:  BP 120/80mg   today. Continue lisinopril  and coreg 6.25mg  BID   Current medicines are reviewed at length with the patient today.  The patient does not have concerns regarding medicines.  The following changes have been made:  no change  Labs/ tests ordered today include:   Orders Placed This Encounter  Procedures  . Basic Metabolic Panel (BMET)  . Echocardiogram     Disposition:   FU with 3 mo with Dr. Tenny Craw after 2D ECHO  Signed, Janetta Hora, PA-C  05/12/2015 10:19 AM    [facility] [address] [place], [place], [place]  [address] Phone: [phone]; Fax: [phone]

## 2015-05-12 ENCOUNTER — Ambulatory Visit (INDEPENDENT_AMBULATORY_CARE_PROVIDER_SITE_OTHER): Payer: Medicaid Other | Admitting: Physician Assistant

## 2015-05-12 ENCOUNTER — Encounter: Payer: Self-pay | Admitting: Physician Assistant

## 2015-05-12 ENCOUNTER — Other Ambulatory Visit: Payer: Self-pay

## 2015-05-12 VITALS — BP 120/80 | HR 80 | Ht 72.0 in | Wt 177.8 lb

## 2015-05-12 DIAGNOSIS — I10 Essential (primary) hypertension: Secondary | ICD-10-CM | POA: Diagnosis not present

## 2015-05-12 DIAGNOSIS — I5041 Acute combined systolic (congestive) and diastolic (congestive) heart failure: Secondary | ICD-10-CM | POA: Diagnosis not present

## 2015-05-12 DIAGNOSIS — I214 Non-ST elevation (NSTEMI) myocardial infarction: Secondary | ICD-10-CM

## 2015-05-12 LAB — BASIC METABOLIC PANEL
BUN: 15 mg/dL (ref 6–23)
CALCIUM: 9.7 mg/dL (ref 8.4–10.5)
CO2: 31 meq/L (ref 19–32)
CREATININE: 1.31 mg/dL (ref 0.40–1.50)
Chloride: 99 mEq/L (ref 96–112)
GFR: 74.21 mL/min (ref >60.00)
Glucose, Bld: 180 mg/dL — ABNORMAL HIGH (ref 70–99)
POTASSIUM: 4.3 meq/L (ref 3.5–5.1)
Sodium: 135 mEq/L (ref 135–145)

## 2015-05-12 NOTE — Patient Instructions (Addendum)
Medication Instructions:  Your physician recommends that you continue on your current medications as directed. Please refer to the Current Medication list given to you today.   Labwork: Your physician recommends that you have lab work today BMET.  Testing/Procedures: Your physician has requested that you have an echocardiogram in 3 months. Echocardiography is a painless test that uses sound waves to create images of your heart. It provides your doctor with information about the size and shape of your heart and how well your heart's chambers and valves are working. This procedure takes approximately one hour. There are no restrictions for this procedure.  Follow-Up: Your physician recommends that you schedule a follow-up appointment in: 3 months with Dr. Tenny Craw after echo has been done.  Any Other Special Instructions Will Be Listed Below (If Applicable).  Your physician recommends that you have cardiac rehabilitation.

## 2015-05-13 ENCOUNTER — Telehealth: Payer: Self-pay | Admitting: Internal Medicine

## 2015-05-13 NOTE — Telephone Encounter (Signed)
Called patient back with lab results. Per Cline Crock PA, Kidney function stable. Okay to continue current lasix dose. No change in plan. Patient verbalized understanding.

## 2015-05-13 NOTE — Telephone Encounter (Signed)
New problem ° ° °Pt returning your call. °

## 2015-07-20 ENCOUNTER — Telehealth: Payer: Self-pay

## 2015-07-20 NOTE — Telephone Encounter (Signed)
Called patient to see if he has started cardiac rehab. There was no answer when calling his number.

## 2015-08-12 ENCOUNTER — Ambulatory Visit (HOSPITAL_COMMUNITY): Payer: Medicaid Other

## 2015-08-17 ENCOUNTER — Other Ambulatory Visit (HOSPITAL_COMMUNITY): Payer: Medicaid Other

## 2015-08-29 ENCOUNTER — Ambulatory Visit: Payer: Medicaid Other | Admitting: Internal Medicine

## 2015-08-31 ENCOUNTER — Encounter: Payer: Self-pay | Admitting: Internal Medicine

## 2016-09-09 ENCOUNTER — Inpatient Hospital Stay (HOSPITAL_COMMUNITY)
Admission: EM | Admit: 2016-09-09 | Discharge: 2016-09-11 | DRG: 293 | Disposition: A | Discharge status: Home / Self Care | Payer: Medicaid Other | Attending: Internal Medicine | Admitting: Internal Medicine

## 2016-09-09 ENCOUNTER — Emergency Department (HOSPITAL_COMMUNITY): Payer: Medicaid Other

## 2016-09-09 ENCOUNTER — Encounter (HOSPITAL_COMMUNITY): Payer: Self-pay | Admitting: Oncology

## 2016-09-09 DIAGNOSIS — I110 Hypertensive heart disease with heart failure: Principal | ICD-10-CM | POA: Diagnosis present

## 2016-09-09 DIAGNOSIS — Z9861 Coronary angioplasty status: Secondary | ICD-10-CM | POA: Diagnosis not present

## 2016-09-09 DIAGNOSIS — Z7984 Long term (current) use of oral hypoglycemic drugs: Secondary | ICD-10-CM | POA: Diagnosis not present

## 2016-09-09 DIAGNOSIS — I255 Ischemic cardiomyopathy: Secondary | ICD-10-CM | POA: Diagnosis present

## 2016-09-09 DIAGNOSIS — I2510 Atherosclerotic heart disease of native coronary artery without angina pectoris: Secondary | ICD-10-CM | POA: Diagnosis present

## 2016-09-09 DIAGNOSIS — I252 Old myocardial infarction: Secondary | ICD-10-CM | POA: Diagnosis not present

## 2016-09-09 DIAGNOSIS — E785 Hyperlipidemia, unspecified: Secondary | ICD-10-CM | POA: Diagnosis present

## 2016-09-09 DIAGNOSIS — Z955 Presence of coronary angioplasty implant and graft: Secondary | ICD-10-CM

## 2016-09-09 DIAGNOSIS — E1159 Type 2 diabetes mellitus with other circulatory complications: Secondary | ICD-10-CM | POA: Diagnosis present

## 2016-09-09 DIAGNOSIS — R778 Other specified abnormalities of plasma proteins: Secondary | ICD-10-CM | POA: Diagnosis present

## 2016-09-09 DIAGNOSIS — I10 Essential (primary) hypertension: Secondary | ICD-10-CM | POA: Diagnosis present

## 2016-09-09 DIAGNOSIS — Z79899 Other long term (current) drug therapy: Secondary | ICD-10-CM | POA: Diagnosis not present

## 2016-09-09 DIAGNOSIS — E876 Hypokalemia: Secondary | ICD-10-CM | POA: Diagnosis not present

## 2016-09-09 DIAGNOSIS — F17200 Nicotine dependence, unspecified, uncomplicated: Secondary | ICD-10-CM | POA: Diagnosis present

## 2016-09-09 DIAGNOSIS — R0602 Shortness of breath: Secondary | ICD-10-CM | POA: Diagnosis present

## 2016-09-09 DIAGNOSIS — Z9114 Patient's other noncompliance with medication regimen: Secondary | ICD-10-CM | POA: Diagnosis not present

## 2016-09-09 DIAGNOSIS — R7989 Other specified abnormal findings of blood chemistry: Secondary | ICD-10-CM

## 2016-09-09 DIAGNOSIS — Z720 Tobacco use: Secondary | ICD-10-CM | POA: Diagnosis not present

## 2016-09-09 DIAGNOSIS — I5041 Acute combined systolic (congestive) and diastolic (congestive) heart failure: Secondary | ICD-10-CM | POA: Diagnosis present

## 2016-09-09 DIAGNOSIS — K219 Gastro-esophageal reflux disease without esophagitis: Secondary | ICD-10-CM | POA: Diagnosis present

## 2016-09-09 DIAGNOSIS — I214 Non-ST elevation (NSTEMI) myocardial infarction: Secondary | ICD-10-CM

## 2016-09-09 DIAGNOSIS — I509 Heart failure, unspecified: Secondary | ICD-10-CM | POA: Diagnosis not present

## 2016-09-09 DIAGNOSIS — I5042 Chronic combined systolic (congestive) and diastolic (congestive) heart failure: Secondary | ICD-10-CM | POA: Diagnosis present

## 2016-09-09 LAB — CBC WITH DIFFERENTIAL/PLATELET
Basophils Absolute: 0 10*3/uL (ref 0.0–0.1)
Basophils Relative: 0 %
Eosinophils Absolute: 0.2 10*3/uL (ref 0.0–0.7)
Eosinophils Relative: 2 %
HCT: 38.8 % — ABNORMAL LOW (ref 39.0–52.0)
HEMOGLOBIN: 13 g/dL (ref 13.0–17.0)
LYMPHS ABS: 2.7 10*3/uL (ref 0.7–4.0)
Lymphocytes Relative: 40 %
MCH: 31.4 pg (ref 26.0–34.0)
MCHC: 33.5 g/dL (ref 30.0–36.0)
MCV: 93.7 fL (ref 78.0–100.0)
MONOS PCT: 7 %
Monocytes Absolute: 0.5 10*3/uL (ref 0.1–1.0)
NEUTROS PCT: 51 %
Neutro Abs: 3.4 10*3/uL (ref 1.7–7.7)
Platelets: 168 10*3/uL (ref 150–400)
RBC: 4.14 MIL/uL — ABNORMAL LOW (ref 4.22–5.81)
RDW: 13.6 % (ref 11.5–15.5)
WBC: 6.8 10*3/uL (ref 4.0–10.5)

## 2016-09-09 LAB — TROPONIN I
Troponin I: 0.32 ng/mL (ref <0.03)
Troponin I: 0.31 ng/mL (ref <0.03)
Troponin I: 0.39 ng/mL (ref <0.03)

## 2016-09-09 LAB — COMPREHENSIVE METABOLIC PANEL
ALT: 14 U/L — ABNORMAL LOW (ref 17–63)
ANION GAP: 6 (ref 5–15)
AST: 16 U/L (ref 15–41)
Albumin: 3.4 g/dL — ABNORMAL LOW (ref 3.5–5.0)
Alkaline Phosphatase: 68 U/L (ref 38–126)
BILIRUBIN TOTAL: 0.5 mg/dL (ref 0.3–1.2)
BUN: 16 mg/dL (ref 6–20)
CALCIUM: 8.4 mg/dL — AB (ref 8.9–10.3)
CO2: 25 mmol/L (ref 22–32)
Chloride: 108 mmol/L (ref 101–111)
Creatinine, Ser: 1.34 mg/dL — ABNORMAL HIGH (ref 0.61–1.24)
GFR, EST NON AFRICAN AMERICAN: 59 mL/min — AB (ref >60)
Glucose, Bld: 113 mg/dL — ABNORMAL HIGH (ref 65–99)
Potassium: 3.9 mmol/L (ref 3.5–5.1)
Sodium: 139 mmol/L (ref 135–145)
TOTAL PROTEIN: 6.4 g/dL — AB (ref 6.5–8.1)

## 2016-09-09 LAB — MRSA PCR SCREENING: MRSA BY PCR: NEGATIVE

## 2016-09-09 LAB — PROTIME-INR
INR: 0.95
Prothrombin Time: 12.7 seconds (ref 11.4–15.2)

## 2016-09-09 LAB — I-STAT TROPONIN, ED: Troponin i, poc: 0.2 ng/mL (ref 0.00–0.08)

## 2016-09-09 LAB — GLUCOSE, CAPILLARY
GLUCOSE-CAPILLARY: 161 mg/dL — AB (ref 65–99)
GLUCOSE-CAPILLARY: 143 mg/dL — AB (ref 65–99)
Glucose-Capillary: 149 mg/dL — ABNORMAL HIGH (ref 65–99)

## 2016-09-09 LAB — APTT: aPTT: 27 seconds (ref 24–36)

## 2016-09-09 LAB — HEPARIN LEVEL (UNFRACTIONATED)
HEPARIN UNFRACTIONATED: 0.22 [IU]/mL — AB (ref 0.30–0.70)
Heparin Unfractionated: 0.17 IU/mL — ABNORMAL LOW (ref 0.30–0.70)

## 2016-09-09 LAB — BRAIN NATRIURETIC PEPTIDE: B Natriuretic Peptide: 476.4 pg/mL — ABNORMAL HIGH (ref 0.0–100.0)

## 2016-09-09 LAB — TSH: TSH: 0.997 u[IU]/mL (ref 0.350–4.500)

## 2016-09-09 MED ORDER — INSULIN ASPART 100 UNIT/ML ~~LOC~~ SOLN
0.0000 [IU] | Freq: Three times a day (TID) | SUBCUTANEOUS | Status: DC
  Administered 2016-09-09: 2 [IU] via SUBCUTANEOUS
  Administered 2016-09-10 – 2016-09-11 (×3): 3 [IU] via SUBCUTANEOUS
  Administered 2016-09-11: 2 [IU] via SUBCUTANEOUS

## 2016-09-09 MED ORDER — SODIUM CHLORIDE 0.9 % IV SOLN: 250.0000 mL | INTRAVENOUS | Status: DC | PRN

## 2016-09-09 MED ORDER — ATORVASTATIN CALCIUM 80 MG PO TABS
80.0000 mg | ORAL_TABLET | Freq: Every day | ORAL | Status: DC
  Administered 2016-09-09 – 2016-09-10 (×2): 80 mg via ORAL
  Filled 2016-09-09 (×2): qty 1

## 2016-09-09 MED ORDER — NITROGLYCERIN 0.4 MG SL SUBL: 0.4000 mg | SUBLINGUAL_TABLET | SUBLINGUAL | Status: DC | PRN

## 2016-09-09 MED ORDER — FUROSEMIDE 40 MG PO TABS
40.0000 mg | ORAL_TABLET | Freq: Every day | ORAL | Status: DC
  Administered 2016-09-09 – 2016-09-11 (×3): 40 mg via ORAL
  Filled 2016-09-09 (×3): qty 1

## 2016-09-09 MED ORDER — ONDANSETRON HCL 4 MG/2ML IJ SOLN: 4.0000 mg | Freq: Four times a day (QID) | INTRAMUSCULAR | Status: DC | PRN

## 2016-09-09 MED ORDER — ACETAMINOPHEN 325 MG PO TABS
650.0000 mg | ORAL_TABLET | ORAL | Status: DC | PRN
  Administered 2016-09-11: 650 mg via ORAL
  Filled 2016-09-09: qty 2

## 2016-09-09 MED ORDER — HEPARIN (PORCINE) IN NACL 100-0.45 UNIT/ML-% IJ SOLN
1600.0000 [IU]/h | INTRAMUSCULAR | Status: DC
  Administered 2016-09-09: 1400 [IU]/h via INTRAVENOUS
  Administered 2016-09-09: 1000 [IU]/h via INTRAVENOUS
  Filled 2016-09-09 (×3): qty 250

## 2016-09-09 MED ORDER — ZOLPIDEM TARTRATE 5 MG PO TABS
5.0000 mg | ORAL_TABLET | Freq: Every evening | ORAL | Status: DC | PRN
Start: 2016-09-09 — End: 2016-09-11

## 2016-09-09 MED ORDER — FUROSEMIDE 10 MG/ML IJ SOLN
20.0000 mg | Freq: Once | INTRAMUSCULAR | Status: AC
  Administered 2016-09-09: 20 mg via INTRAVENOUS
  Filled 2016-09-09: qty 4

## 2016-09-09 MED ORDER — ALPRAZOLAM 0.25 MG PO TABS: 0.2500 mg | ORAL_TABLET | Freq: Two times a day (BID) | ORAL | Status: DC | PRN

## 2016-09-09 MED ORDER — PNEUMOCOCCAL VAC POLYVALENT 25 MCG/0.5ML IJ INJ
0.5000 mL | INJECTION | INTRAMUSCULAR | Status: DC
  Filled 2016-09-09: qty 0.5

## 2016-09-09 MED ORDER — LISINOPRIL 10 MG PO TABS
10.0000 mg | ORAL_TABLET | Freq: Every day | ORAL | Status: DC
  Administered 2016-09-09 – 2016-09-11 (×3): 10 mg via ORAL
  Filled 2016-09-09 (×4): qty 1

## 2016-09-09 MED ORDER — SODIUM CHLORIDE 0.9% FLUSH: 3.0000 mL | INTRAVENOUS | Status: DC | PRN

## 2016-09-09 MED ORDER — NITROGLYCERIN IN D5W 200-5 MCG/ML-% IV SOLN
0.0000 ug/min | Freq: Once | INTRAVENOUS | Status: AC
  Administered 2016-09-09: 5 ug/min via INTRAVENOUS
  Filled 2016-09-09: qty 250

## 2016-09-09 MED ORDER — HEPARIN BOLUS VIA INFUSION
4000.0000 [IU] | Freq: Once | INTRAVENOUS | Status: AC
  Administered 2016-09-09: 4000 [IU] via INTRAVENOUS
  Filled 2016-09-09: qty 4000

## 2016-09-09 MED ORDER — CARVEDILOL 6.25 MG PO TABS
6.2500 mg | ORAL_TABLET | Freq: Two times a day (BID) | ORAL | Status: DC
  Administered 2016-09-09 – 2016-09-10 (×2): 6.25 mg via ORAL
  Filled 2016-09-09 (×2): qty 1

## 2016-09-09 MED ORDER — SODIUM CHLORIDE 0.9% FLUSH
3.0000 mL | Freq: Two times a day (BID) | INTRAVENOUS | Status: DC
  Administered 2016-09-09: 3 mL via INTRAVENOUS

## 2016-09-09 NOTE — ED Notes (Signed)
He is transported via [facility] at this time without incident.  He remains alert and comfortable.

## 2016-09-09 NOTE — ED Notes (Signed)
Patient transported to X-ray 

## 2016-09-09 NOTE — Progress Notes (Signed)
CRITICAL VALUE ALERT  Critical value received:  trop  Date of notification:  09/09/2016  Time of notification:  1140   Critical value read back:Yes.    Nurse who received alert:  Tammy Sours  MD notified (1st page):  [email or web address]  Time of first page:  1140  MD notified (2nd page):  Time of second page:  Responding MD:  hochrein  Time MD responded: on unit

## 2016-09-09 NOTE — Progress Notes (Signed)
ANTICOAGULATION CONSULT NOTE - Initial Consult  Pharmacy Consult for Heparin Indication: chest pain/ACS  Allergies  Allergen Reactions  . Aspirin Hives    Makes pt feel funny  . Other Nausea And Vomiting    Grape products    Patient Measurements: Height: 6' (182.9 cm) Weight: 180 lb (81.6 kg) IBW/kg (Calculated) : 77.6 Heparin Dosing Weight: actual body weight  Vital Signs: Temp: 98.8 F (37.1 C) (09/10 0357) Temp Source: Oral (09/10 0357) BP: 161/99 (09/10 0357) Pulse Rate: 75 (09/10 0357)  Labs:  Recent Labs  09/09/16 0427  HGB 13.0  HCT 38.8*  PLT 168  CREATININE 1.34*    Estimated Creatinine Clearance: 70.8 mL/min (by C-G formula based on SCr of 1.34 mg/dL).   Medical History: Past Medical History:  Diagnosis Date  . CAD (coronary artery disease)    a. NSTEMI s/p DES to pLAD and dRCA  . Diabetes mellitus   . GERD (gastroesophageal reflux disease)   . HLD (hyperlipidemia)   . Hypertension   . Ischemic cardiomyopathy    a. 2D ECHO 05/02/15 with EF 25-30%, mild LV dilation. Mild LVH, mod HKof the mid-apicalanteroseptal myocardium, G1DD  . Tobacco abuse     Medications:  Scheduled:  . heparin  4,000 Units Intravenous Once  . nitroGLYCERIN  0-200 mcg/min Intravenous Once   Infusions:  . heparin      Assessment:  51 yr male with shortness of breath and elevated troponin  Pharmacy consulted to dose IV heparin for ACS/STEMI  On no oral anticoagulants PTA  Goal of Therapy:  Heparin level 0.3-0.7 units/ml Monitor platelets by anticoagulation protocol: Yes   Plan:   Baseline aPTT and PT/INR  Heparin 4000 unit IV bolus x 1 followed by infusion @ 1000 units/hr  Check heparin level 6 hr after heparin started  Follow heparin level & CBC daily   Asia Dusenbury, Joselyn Glassman, PharmD 09/09/2016,5:53 AM

## 2016-09-09 NOTE — Progress Notes (Signed)
ANTICOAGULATION CONSULT NOTE - Initial Consult  Pharmacy Consult for Heparin Indication: chest pain/ACS  Allergies  Allergen Reactions  . Aspirin Hives    Makes pt feel funny  . Other Nausea And Vomiting    Grape products    Patient Measurements: Height: 6' (182.9 cm) Weight: 190 lb 7.6 oz (86.4 kg) IBW/kg (Calculated) : 77.6 Heparin Dosing Weight: 86 kg  Vital Signs: Temp: 98.7 F (37.1 C) (09/10 1210) Temp Source: Oral (09/10 1210) BP: 173/112 (09/10 1215) Pulse Rate: 82 (09/10 1215)  Labs:  Recent Labs  09/09/16 0427 09/09/16 1003 09/09/16 1114  HGB 13.0  --   --   HCT 38.8*  --   --   PLT 168  --   --   APTT 27  --   --   LABPROT 12.7  --   --   INR 0.95  --   --   HEPARINUNFRC  --   --  0.22*  CREATININE 1.34*  --   --   TROPONINI  --  0.32*  --     Estimated Creatinine Clearance: 70.8 mL/min (by C-G formula based on SCr of 1.34 mg/dL).   Medical History: Past Medical History:  Diagnosis Date  . CAD (coronary artery disease)    a. NSTEMI s/p DES to pLAD and dRCA  . Diabetes mellitus   . GERD (gastroesophageal reflux disease)   . HLD (hyperlipidemia)   . Hypertension   . Ischemic cardiomyopathy    a. 2D ECHO 05/02/15 with EF 25-30%, mild LV dilation. Mild LVH, mod HKof the mid-apicalanteroseptal myocardium, G1DD  . Tobacco abuse     Medications:  Scheduled:  . atorvastatin  80 mg Oral q1800  . carvedilol  6.25 mg Oral BID WC  . furosemide  40 mg Oral Daily  . lisinopril  10 mg Oral Daily  . sodium chloride flush  3 mL Intravenous Q12H   Infusions:  . heparin 1,000 Units/hr (09/09/16 1200)    Assessment: 51 yr male with shortness of breath and elevated troponin. Pharmacy consulted to dose IV heparin for ACS/STEMI. On no oral anticoagulants PTA  Heparin level = 0.22, subtherapeutic on 1000 units/hr. No issue with infusion, No bleeding noted per chart.  Goal of Therapy:  Heparin level 0.3-0.7 units/ml Monitor platelets by anticoagulation  protocol: Yes   Plan:  - Increase heparin rate to 1200 units/hr - Recheck heparin level after 6 hrs at 1930  - Follow heparin level & CBC daily   Bayard Hugger, PharmD, BCPS  Clinical Pharmacist  Pager: [phone]  09/09/2016,1:07 PM

## 2016-09-09 NOTE — ED Triage Notes (Signed)
Per pt Saturday morning he began to cough and feel shob.  Lung sounds are decreased b/l.  Denies CP.  Pt can speak in full sentences.

## 2016-09-09 NOTE — Significant Event (Signed)
I was contacted by Dr. Merton Border at [facility] regarding transfer of Mr. Alan Diaz for NSTEMI-ACS.  He initially presented to their hospital with shortness of breath.  ECG showed new ST-depressions in the inferior leads and troponin level was elevated.  He was given aspirin and started on heparin.  Patient was accepted to [facility] for further evaluation of NSTEMI.  Burton Apley, MD

## 2016-09-09 NOTE — ED Notes (Signed)
I have just phoned report to [place], RN at [facility]; and [facility] is en route to our hospital.  Pt. Is awake, alert and tells [place] he is comfortable.

## 2016-09-09 NOTE — Progress Notes (Signed)
ANTICOAGULATION CONSULT NOTE   Pharmacy Consult for Heparin Indication: chest pain/ACS  Allergies  Allergen Reactions  . Aspirin Hives    Makes pt feel funny  . Other Nausea And Vomiting    Grape products    Patient Measurements: Height: 6' (182.9 cm) Weight: 190 lb 7.6 oz (86.4 kg) IBW/kg (Calculated) : 77.6 Heparin Dosing Weight: 86 kg  Vital Signs: Temp: 99.1 F (37.3 C) (09/10 1924) Temp Source: Oral (09/10 1924) BP: 138/96 (09/10 1924) Pulse Rate: 77 (09/10 1637)  Labs:  Recent Labs  09/09/16 0427 09/09/16 1003 09/09/16 1114 09/09/16 1515 09/09/16 1903  HGB 13.0  --   --   --   --   HCT 38.8*  --   --   --   --   PLT 168  --   --   --   --   APTT 27  --   --   --   --   LABPROT 12.7  --   --   --   --   INR 0.95  --   --   --   --   HEPARINUNFRC  --   --  0.22*  --  0.17*  CREATININE 1.34*  --   --   --   --   TROPONINI  --  0.32*  --  0.31*  --     Estimated Creatinine Clearance: 70.8 mL/min (by C-G formula based on SCr of 1.34 mg/dL).   Medical History: Past Medical History:  Diagnosis Date  . CAD (coronary artery disease)    a. NSTEMI s/p DES to pLAD and dRCA  . Diabetes mellitus   . GERD (gastroesophageal reflux disease)   . HLD (hyperlipidemia)   . Hypertension   . Ischemic cardiomyopathy    a. 2D ECHO 05/02/15 with EF 25-30%, mild LV dilation. Mild LVH, mod HKof the mid-apicalanteroseptal myocardium, G1DD  . Tobacco abuse     Medications:  Scheduled:  . atorvastatin  80 mg Oral q1800  . carvedilol  6.25 mg Oral BID WC  . furosemide  40 mg Oral Daily  . insulin aspart  0-15 Units Subcutaneous TID WC  . lisinopril  10 mg Oral Daily  . [START ON 09/10/2016] pneumococcal 23 valent vaccine  0.5 mL Intramuscular Tomorrow-1000  . sodium chloride flush  3 mL Intravenous Q12H   Infusions:  . heparin 1,200 Units/hr (09/09/16 1924)    Assessment: 51 yr male with shortness of breath and elevated troponin. Pharmacy consulted to dose IV heparin  for ACS/STEMI. On no oral anticoagulants PTA  Heparin level = 0.17, subtherapeutic on 1200 units/hr.  Goal of Therapy:  Heparin level 0.3-0.7 units/ml Monitor platelets by anticoagulation protocol: Yes   Plan:  - Increase heparin rate to 1400 units/hr - Heparin level in am   - Follow heparin level & CBC daily    Pollyann Samples, PharmD, BCPS 09/09/2016, 7:59 PM Pager: [phone]

## 2016-09-09 NOTE — Plan of Care (Signed)
Problem: Skin Integrity: Goal: Risk for impaired skin integrity will decrease Outcome: Progressing Able to change position and ambulate independently

## 2016-09-09 NOTE — ED Provider Notes (Signed)
[facility]-EMERGENCY DEPT Provider Note   CSN: [id number] Arrival date & time: 09/09/16  0335     History   Chief Complaint Chief Complaint  Patient presents with  . Shortness of Breath    HPI Alan Diaz is a 51 y.o. male.  Patient presents to the emergency room for evaluation of shortness of breath. Patient reports that he has been feeling short of breath for one full day. Symptoms worsened tonight. Patient denies any associated chest pain. He has had cough. He has a history of congestive heart failure, does not weigh himself daily. He has not noticed any swelling of his legs.      Past Medical History:  Diagnosis Date  . CAD (coronary artery disease)    a. NSTEMI s/p DES to pLAD and dRCA  . Diabetes mellitus   . GERD (gastroesophageal reflux disease)   . HLD (hyperlipidemia)   . Hypertension   . Ischemic cardiomyopathy    a. 2D ECHO 05/02/15 with EF 25-30%, mild LV dilation. Mild LVH, mod HKof the mid-apicalanteroseptal myocardium, G1DD  . Tobacco abuse     Patient Active Problem List   Diagnosis Date Noted  . NSTEMI (non-ST elevated myocardial infarction) (HCC) 05/05/2015  . GERD (gastroesophageal reflux disease)   . HLD (hyperlipidemia)   . CAD (coronary artery disease)   . Acute combined systolic and diastolic heart failure, NYHA class 4 (HCC) 05/03/2015  . Essential hypertension 05/03/2015  . TOBACCO ABUSE 01/19/2009  . ELEVATED BLOOD PRESSURE 01/19/2009  . Diabetes (HCC) 10/31/2008    Past Surgical History:  Procedure Laterality Date  . CARDIAC CATHETERIZATION N/A 05/03/2015   Procedure: Right/Left Heart Cath and Coronary Angiography;  Surgeon: Peter M SwazilandJordan, MD;  Location: [facility]VASIVE [facility];  Service: Cardiovascular;  Laterality: N/A;  . CARDIAC CATHETERIZATION N/A 05/03/2015   Procedure: Intravascular Ultrasound/IVUS;  Surgeon: Peter M [place]az[place]Jordan, MD;  Location: [facility] INVASIVE CV LAB [facility];  Service: Cardiovascular;  Laterality: N/A;  . CARDIAC  CATHETERIZATION N/A 05/03/2015   Procedure: Coronary Stent Intervention;  Surgeon: Peter M [place]az[place]andJordan, MD;  Location: [facility]VAS[facility] CV [facility];  Service: Cardiovascular;  Laterality: N/A;  rca  . PERCUTANEOUS CORONARY STENT INTERVENTION (PCI-S)  05/03/2015   Procedure: Percutaneous Coronary Stent Intervention (Pci-S);  Surgeon: Peter M SwazilandJordan, MD;  [facility];  Service: Cardiovascular;;  Prox LAD  . WISDOM TOOTH EXTRACTION     bottom LT, top RT       Home Medications    Prior to Admission medications   Medication Sig Start Date End Date Taking? Authorizing Provider  atorvastatin (LIPITOR) 80 MG tablet Take 1 tablet (80 mg total) by mouth daily at 6 PM. 05/05/15  Yes Janetta HoraKathryn R Thompson, PA-C  carvedilol (COREG) 6.25 MG tablet Take 1 tablet (6.25 mg total) by mouth 2 (two) times daily with a meal. 05/05/15  Yes Janetta HoraKathryn R Thompson, PA-C  furosemide (LASIX) 20 MG tablet Take 1 tablet (20 mg total) by mouth daily. 05/05/15  Yes Janetta HoraKathryn R Thompson, PA-C  lisinopril (PRINIVIL,ZESTRIL) 10 MG tablet Take 1 tablet (10 mg total) by mouth daily. 05/05/15  Yes Janetta HoraKathryn R Thompson, PA-C  metFORMIN (GLUCOPHAGE) 500 MG tablet Take 1 tablet (500 mg total) by mouth 2 (two) times daily with a meal. 05/06/15  Yes Janetta HoraKathryn R Thompson, PA-C  nitroGLYCERIN (NITROSTAT) 0.4 MG SL tablet Place 1 tablet (0.4 mg total) under the tongue every 5 (five) minutes as needed for chest pain. 05/05/15  Yes Wille CelesteKathryn R  Janee Morn, PA-C  ticagrelor (BRILINTA) 90 MG TABS tablet Take 1 tablet (90 mg total) by mouth 2 (two) times daily. 05/05/15  Yes Janetta Hora, PA-C    Family History Family History  Problem Relation Age of Onset  . Breast cancer Mother   . Diabetes Sister   . Hypertension Sister   . Hypertension Brother   . Diabetes Brother     Social History Social History  Substance Use Topics  . Smoking status: Former Smoker    Packs/day: 0.30    Types: Cigarettes    Quit date: 05/03/2015  . Smokeless tobacco:  Never Used  . Alcohol use 0.0 oz/week     Comment: very occasionally     Allergies   Aspirin and Other   Review of Systems Review of Systems  Respiratory: Positive for shortness of breath.   Cardiovascular: Negative for chest pain.  All other systems reviewed and are negative.    Physical Exam Updated Vital Signs BP 161/99 (BP Location: Left Arm)   Pulse 75   Temp 98.8 F (37.1 C) (Oral)   Resp 22   Ht 6' (1.829 m)   Wt 180 lb (81.6 kg)   SpO2 96%   BMI 24.41 kg/m   Physical Exam  Constitutional: He is oriented to person, place, and time. He appears well-developed and well-nourished. No distress.  HENT:  Head: Normocephalic and atraumatic.  Right Ear: Hearing normal.  Left Ear: Hearing normal.  Nose: Nose normal.  Mouth/Throat: Oropharynx is clear and moist and mucous membranes are normal.  Eyes: Conjunctivae and EOM are normal. Pupils are equal, round, and reactive to light.  Neck: Normal range of motion. Neck supple.  Cardiovascular: Regular rhythm, S1 normal and S2 normal.  Exam reveals no gallop and no friction rub.   No murmur heard. Pulmonary/Chest: Effort normal and breath sounds normal. No respiratory distress. He exhibits no tenderness.  Abdominal: Soft. Normal appearance and bowel sounds are normal. There is no hepatosplenomegaly. There is no tenderness. There is no rebound, no guarding, no tenderness at McBurney's point and negative Murphy's sign. No hernia.  Musculoskeletal: Normal range of motion.  Neurological: He is alert and oriented to person, place, and time. He has normal strength. No cranial nerve deficit or sensory deficit. Coordination normal. GCS eye subscore is 4. GCS verbal subscore is 5. GCS motor subscore is 6.  Skin: Skin is warm, dry and intact. No rash noted. No cyanosis.  Psychiatric: He has a normal mood and affect. His speech is normal and behavior is normal. Thought content normal.  Nursing note and vitals reviewed.    ED  Treatments / Results  Labs (all labs ordered are listed, but only abnormal results are displayed) Labs Reviewed  CBC WITH DIFFERENTIAL/PLATELET - Abnormal; Notable for the following:       Result Value   RBC 4.14 (*)    HCT 38.8 (*)    All other components within normal limits  COMPREHENSIVE METABOLIC PANEL - Abnormal; Notable for the following:    Glucose, Bld 113 (*)    Creatinine, Ser 1.34 (*)    Calcium 8.4 (*)    Total Protein 6.4 (*)    Albumin 3.4 (*)    ALT 14 (*)    GFR calc non Af Amer 59 (*)    All other components within normal limits  BRAIN NATRIURETIC PEPTIDE - Abnormal; Notable for the following:    B Natriuretic Peptide 476.4 (*)    All other components within  normal limits  I-STAT TROPOININ, ED - Abnormal; Notable for the following:    Troponin i, poc 0.20 (*)    All other components within normal limits  APTT  PROTIME-INR    EKG  EKG Interpretation  Date/Time:  Sunday September 09 2016 03:55:03 EDT Ventricular Rate:  80 PR Interval:    QRS Duration: 88 QT Interval:  339 QTC Calculation: 391 R Axis:   75 Text Interpretation:  Sinus rhythm Probable LVH with secondary repol abnrm vs infero-lateral ischemia lateral changes seen previously, inferior are new Confirmed by Blinda Leatherwood  MD, Charlise Giovanetti [id number]) on 09/09/2016 4:03:36 AM       Radiology Dg Chest 2 View  Result Date: 09/09/2016 CLINICAL DATA:  Acute onset of shortness of breath. Initial encounter. EXAM: CHEST  2 VIEW COMPARISON:  Chest radiograph performed 05/02/2015 FINDINGS: The lungs are well-aerated. Vascular congestion is noted. Increased interstitial markings may reflect mild pulmonary edema or possibly pneumonia. There is no evidence of pleural effusion or pneumothorax. The heart is normal in size; the mediastinal contour is within normal limits. No acute osseous abnormalities are seen. IMPRESSION: Vascular congestion noted. Increased interstitial markings may reflect mild pulmonary edema or  possibly pneumonia. Electronically Signed   By: Roanna Raider M.D.   On: 09/09/2016 04:22    Procedures Procedures (including critical care time)  Medications Ordered in ED Medications  nitroGLYCERIN 50 mg in dextrose 5 % 250 mL (0.2 mg/mL) infusion (not administered)  heparin bolus via infusion 4,000 Units (not administered)    Followed by  heparin ADULT infusion 100 units/mL (25000 units/21mL sodium chloride 0.45%) (not administered)  furosemide (LASIX) injection 20 mg (not administered)     Initial Impression / Assessment and Plan / ED Course  I have reviewed the triage vital signs and the nursing notes.  Pertinent labs & imaging results that were available during my care of the patient were reviewed by me and considered in my medical decision making (see chart for details).  Clinical Course   Patient presents to the emergency para for evaluation of difficulty breathing. Patient has a history of ischemic cardiomyopathy. He is not experiencing chest pain currently, however, he reports that when he received his stents last year his only symptom was dyspnea. EKG at arrival concerning for inferior and lateral ischemia, although LVH could also explain the findings. No ST elevations to suggest STEMI.  Lab work does reveal elevated troponin, mildly elevated BNP. Chest x-ray does look suspicious for mild edema. Patient hypertensive at arrival. He was treated with aspirin, IV nitroglycerin, IV heparin, IV Lasix. Will require hospitalization for further management.  CRITICAL CARE Performed by: Gilda Crease   Total critical care time: 35 minutes  Critical care time was exclusive of separately billable procedures and treating other patients.  Critical care was necessary to treat or prevent imminent or life-threatening deterioration.  Critical care was time spent personally by me on the following activities: development of treatment plan with patient and/or surrogate as well  as nursing, discussions with consultants, evaluation of patient's response to treatment, examination of patient, obtaining history from patient or surrogate, ordering and performing treatments and interventions, ordering and review of laboratory studies, ordering and review of radiographic studies, pulse oximetry and re-evaluation of patient's condition.   Final Clinical Impressions(s) / ED Diagnoses   Final diagnoses:  NSTEMI (non-ST elevated myocardial infarction) [facility])    New Prescriptions New Prescriptions   No medications on file     Gilda Crease, MD 09/09/16  0602  

## 2016-09-09 NOTE — H&P (Signed)
Patient ID: Alan Diaz MRN: [id number], DOB/AGE: 1965/09/20   Admit date: 09/09/2016   Primary Physician: No PCP Per Patient Primary Cardiologist: Dr Tenny Craw  HPI: 51 y/o AA male who we initially saw in May 2016 when he presented with a NSTEMI. He had an LAD and RCA DES placed. EF then was 25%, not felt to be high risk so no Life Vest. He was seen in follow up once and a f/u echo was to be done but he never returned for further OV. He says he has done well since. He denies any chest pain or unusual dyspnea till 2 days ago. He hasn't taken any of his medications in months, still smokes, and doesn't follow a low sodium diet. He has noticed some "wheezing" for two days and became increasingly SOB last 24 hrs and presented to [facility] ED yesterday. CXR confirms CHF, BNP 407, Troponin 0.2. He denies chest pain. Better after only 20 mg of Lasix IV.    Problem List: Past Medical History:  Diagnosis Date  . CAD (coronary artery disease)    a. NSTEMI s/p DES to pLAD and dRCA  . Diabetes mellitus   . GERD (gastroesophageal reflux disease)   . HLD (hyperlipidemia)   . Hypertension   . Ischemic cardiomyopathy    a. 2D ECHO 05/02/15 with EF 25-30%, mild LV dilation. Mild LVH, mod HKof the mid-apicalanteroseptal myocardium, G1DD  . Tobacco abuse     Past Surgical History:  Procedure Laterality Date  . CARDIAC CATHETERIZATION N/A 05/03/2015   Procedure: Right/Left Heart Cath and Coronary Angiography;  Surgeon: Peter M Swaziland, MD;  Location: [facility] INVASIVE CV LAB CUPID;  Service: Cardiovascular;  Laterality: N/A;  . CARDIAC CATHETERIZATION N/A 05/03/2015   Procedure: Intravascular Ultrasound/IVUS;  Surgeon: Peter M Swaziland, MD;  Location: [facility] INVASIVE CV LAB CUPID;  Service: Cardiovascular;  Laterality: N/A;  . CARDIAC CATHETERIZATION N/A 05/03/2015   Procedure: Coronary Stent Intervention;  Surgeon: Peter M Swaziland, MD;  Location: [facility] INVASIVE CV LAB CUP[facility];  Service: Cardiovascular;  Laterality: N/A;  rca  .  PERCUTANEOUS CORONARY STENT INTERVENTION (PCI-S)  05/03/2015   Procedure: Percutaneous Coronary Stent Intervention (Pci-S);  Surgeon: Peter M Swaziland, MD;  Location: [facility] INVASIVE CV LAB CUPID;  Service: Cardiovascular;;  Prox LAD  . WISDOM TOOTH EXTRACTION     bottom LT, top RT     Allergies:  Allergies  Allergen Reactions  . Aspirin Hives    Makes pt feel funny  . Other Nausea And Vomiting    Grape products     Home Medications Prior to Admission medications   Medication Sig Start Date End Date Taking? Authorizing Provider  atorvastatin (LIPITOR) 80 MG tablet Take 1 tablet (80 mg total) by mouth daily at 6 PM. 05/05/15  Yes Janetta Hora, PA-C  carvedilol (COREG) 6.25 MG tablet Take 1 tablet (6.25 mg total) by mouth 2 (two) times daily with a meal. 05/05/15  Yes Janetta Hora, PA-C  furosemide (LASIX) 20 MG tablet Take 1 tablet (20 mg total) by mouth daily. 05/05/15  Yes Janetta Hora, PA-C  lisinopril (PRINIVIL,ZESTRIL) 10 MG tablet Take 1 tablet (10 mg total) by mouth daily. 05/05/15  Yes Janetta Hora, PA-C  metFORMIN (GLUCOPHAGE) 500 MG tablet Take 1 tablet (500 mg total) by mouth 2 (two) times daily with a meal. 05/06/15  Yes Janetta Hora, PA-C  nitroGLYCERIN (NITROSTAT) 0.4 MG SL tablet Place 1 tablet (0.4 mg total) under the tongue every 5 (  five) minutes as needed for chest pain. 05/05/15  Yes Janetta Hora, PA-C  ticagrelor (BRILINTA) 90 MG TABS tablet Take 1 tablet (90 mg total) by mouth 2 (two) times daily. 05/05/15  Yes Janetta Hora, PA-C     Family History  Problem Relation Age of Onset  . Breast cancer Mother   . Diabetes Sister   . Hypertension Sister   . Hypertension Brother   . Diabetes Brother      Social History   Social History  . Marital status: Divorced    Spouse name: N/A  . Number of children: N/A  . Years of education: N/A   Occupational History  . Not on file.   Social History Main Topics  . Smoking status: Former Smoker      Packs/day: 0.30    Types: Cigarettes    Quit date: 05/03/2015  . Smokeless tobacco: Never Used  . Alcohol use 0.0 oz/week     Comment: very occasionally  . Drug use:     Types: Marijuana     Comment: denies 06/28/14  . Sexual activity: Not on file   Other Topics Concern  . Not on file   Social History Narrative  . No narrative on file     Review of Systems: General: negative for chills, fever, night sweats or weight changes.  Cardiovascular: negative for chest pain, dyspnea on exertion, edema, orthopnea, palpitations, paroxysmal nocturnal dyspnea or shortness of breath HEENT: negative for any visual disturbances, blindness, glaucoma Dermatological: negative for rash Respiratory: negative for cough, hemoptysis, or wheezing Urologic: negative for hematuria or dysuria Abdominal: negative for nausea, vomiting, diarrhea, bright red blood per rectum, melena, or hematemesis Neurologic: negative for visual changes, syncope, or dizziness Musculoskeletal: negative for back pain, joint pain, or swelling Psych: cooperative and appropriate All other systems reviewed and are otherwise negative except as noted above.  Physical Exam: Blood pressure 163/99, pulse 77, temperature 99.2 F (37.3 C), temperature source Oral, resp. rate 25, height 6' (1.829 m), weight 180 lb (81.6 kg), SpO2 95 %.  General appearance: alert, cooperative and no distress Neck: no carotid bruit and no JVD Lungs: clear to auscultation bilaterally Heart: regular rate and rhythm Abdomen: soft, non-tender; bowel sounds normal; no masses,  no organomegaly Extremities: extremities normal, atraumatic, no cyanosis or edema Pulses: 2+ and symmetric Skin: Skin color, texture, turgor normal. No rashes or lesions Neurologic: Grossly normal    Labs:   Results for orders placed or performed during the hospital encounter of 09/09/16 (from the past 24 hour(s))  CBC with Differential/Platelet     Status: Abnormal    Collection Time: 09/09/16  4:27 AM  Result Value Ref Range   WBC 6.8 4.0 - 10.5 K/uL   RBC 4.14 (L) 4.22 - 5.81 MIL/uL   Hemoglobin 13.0 13.0 - 17.0 g/dL   HCT 81.1 (L) 91.4 - 78.2 %   MCV 93.7 78.0 - 100.0 fL   MCH 31.4 26.0 - 34.0 pg   MCHC 33.5 30.0 - 36.0 g/dL   RDW 95.6 21.3 - 08.6 %   Platelets 168 150 - 400 K/uL   Neutrophils Relative % 51 %   Neutro Abs 3.4 1.7 - 7.7 K/uL   Lymphocytes Relative 40 %   Lymphs Abs 2.7 0.7 - 4.0 K/uL   Monocytes Relative 7 %   Monocytes Absolute 0.5 0.1 - 1.0 K/uL   Eosinophils Relative 2 %   Eosinophils Absolute 0.2 0.0 - 0.7 K/uL   Basophils Relative  0 %   Basophils Absolute 0.0 0.0 - 0.1 K/uL  Comprehensive metabolic panel     Status: Abnormal   Collection Time: 09/09/16  4:27 AM  Result Value Ref Range   Sodium 139 135 - 145 mmol/L   Potassium 3.9 3.5 - 5.1 mmol/L   Chloride 108 101 - 111 mmol/L   CO2 25 22 - 32 mmol/L   Glucose, Bld 113 (H) 65 - 99 mg/dL   BUN 16 6 - 20 mg/dL   Creatinine, Ser 1.61 (H) 0.61 - 1.24 mg/dL   Calcium 8.4 (L) 8.9 - 10.3 mg/dL   Total Protein 6.4 (L) 6.5 - 8.1 g/dL   Albumin 3.4 (L) 3.5 - 5.0 g/dL   AST 16 15 - 41 U/L   ALT 14 (L) 17 - 63 U/L   Alkaline Phosphatase 68 38 - 126 U/L   Total Bilirubin 0.5 0.3 - 1.2 mg/dL   GFR calc non Af Amer 59 (L) >60 mL/min   GFR calc Af Amer >60 >60 mL/min   Anion gap 6 5 - 15  Brain natriuretic peptide     Status: Abnormal   Collection Time: 09/09/16  4:27 AM  Result Value Ref Range   B Natriuretic Peptide 476.4 (H) 0.0 - 100.0 pg/mL  APTT     Status: None   Collection Time: 09/09/16  4:27 AM  Result Value Ref Range   aPTT 27 24 - 36 seconds  Protime-INR     Status: None   Collection Time: 09/09/16  4:27 AM  Result Value Ref Range   Prothrombin Time 12.7 11.4 - 15.2 seconds   INR 0.95   I-stat troponin, ED     Status: Abnormal   Collection Time: 09/09/16  4:41 AM  Result Value Ref Range   Troponin i, poc 0.20 (HH) 0.00 - 0.08 ng/mL   Comment NOTIFIED  PHYSICIAN    Comment 3             Radiology/Studies: Dg Chest 2 View  Result Date: 09/09/2016 CLINICAL DATA:  Acute onset of shortness of breath. Initial encounter. EXAM: CHEST  2 VIEW COMPARISON:  Chest radiograph performed 05/02/2015 FINDINGS: The lungs are well-aerated. Vascular congestion is noted. Increased interstitial markings may reflect mild pulmonary edema or possibly pneumonia. There is no evidence of pleural effusion or pneumothorax. The heart is normal in size; the mediastinal contour is within normal limits. No acute osseous abnormalities are seen. IMPRESSION: Vascular congestion noted. Increased interstitial markings may reflect mild pulmonary edema or possibly pneumonia. Electronically Signed   By: Roanna Raider M.D.   On: 09/09/2016 04:22    EKG:NSR, LVH with repol  ASSESSMENT AND PLAN:  Principal Problem:   Acute combined systolic and diastolic heart failure, NYHA class 4 (HCC) Active Problems:   CAD- S/P PCI May 2016   Elevated troponin   Type 2 diabetes mellitus with circulatory disorder (HCC)   Essential hypertension   Cardiomyopathy, ischemic   Non compliance w medication regimen   TOBACCO ABUSE   HLD (hyperlipidemia)   PLAN: Cycle Troponin but I suspect this is demand ischemia from acute CHF. Check echo, adjust medications.    Jolene Provost, PA-C 09/09/2016, 9:38 AM [phone]  History and all data above reviewed.  Patient examined.  I agree with the findings as above. The patient presented with relatively sudden SOB.  He has not taken any meds or had any other TLC (Therapeutic Lifestyle Changes).  He denies any chest pain.  He has  had no neck or arm pain. The patient exam reveals COR:RRR  ,  Lungs: Basilar crackles  ,  Abd: Positive bowel sounds, no rebound no guarding, Ext No edema  .  All available labs, radiology testing, previous records reviewed. Agree with documented assessment and plan. Acute on chronic systolic HF:  Secondary to med and  lifestyle noncompliance.  Restart meds as above and adjust as needed for BP.  Educate.  CAD:  Elevated troponin which I think is secondary.  Doubt that he will need an invasive evaluation as an in patient.  Check echo and cycle enzymes.  Tobacco:  Educated.  DM:  Check A1c.  (A1C had been 13 in the past was 7 last year.)   HLD:  Check lipids and resume meds.    Fayrene FearingJames Eastern State Hospitalochrein  9:54 AM  09/09/2016

## 2016-09-10 ENCOUNTER — Inpatient Hospital Stay (HOSPITAL_COMMUNITY): Payer: Medicaid Other

## 2016-09-10 DIAGNOSIS — I509 Heart failure, unspecified: Secondary | ICD-10-CM

## 2016-09-10 LAB — LIPID PANEL
CHOL/HDL RATIO: 4.3 ratio
Cholesterol: 238 mg/dL — ABNORMAL HIGH (ref 0–200)
HDL: 55 mg/dL (ref >40)
LDL CALC: 161 mg/dL — AB (ref 0–99)
TRIGLYCERIDES: 108 mg/dL (ref <150)
VLDL: 22 mg/dL (ref 0–40)

## 2016-09-10 LAB — BASIC METABOLIC PANEL
Anion gap: 8 (ref 5–15)
BUN: 11 mg/dL (ref 6–20)
CO2: 29 mmol/L (ref 22–32)
Calcium: 8.8 mg/dL — ABNORMAL LOW (ref 8.9–10.3)
Chloride: 101 mmol/L (ref 101–111)
Creatinine, Ser: 1.37 mg/dL — ABNORMAL HIGH (ref 0.61–1.24)
GFR calc Af Amer: >60 mL/min (ref >60)
GFR calc non Af Amer: 58 mL/min — ABNORMAL LOW (ref >60)
Glucose, Bld: 128 mg/dL — ABNORMAL HIGH (ref 65–99)
Potassium: 3.1 mmol/L — ABNORMAL LOW (ref 3.5–5.1)
Sodium: 138 mmol/L (ref 135–145)

## 2016-09-10 LAB — GLUCOSE, CAPILLARY
GLUCOSE-CAPILLARY: 167 mg/dL — AB (ref 65–99)
Glucose-Capillary: 158 mg/dL — ABNORMAL HIGH (ref 65–99)
Glucose-Capillary: 120 mg/dL — ABNORMAL HIGH (ref 65–99)
Glucose-Capillary: 156 mg/dL — ABNORMAL HIGH (ref 65–99)

## 2016-09-10 LAB — CBC
HEMATOCRIT: 45.3 % (ref 39.0–52.0)
HEMOGLOBIN: 15.5 g/dL (ref 13.0–17.0)
MCH: 32.2 pg (ref 26.0–34.0)
MCHC: 34.2 g/dL (ref 30.0–36.0)
MCV: 94 fL (ref 78.0–100.0)
Platelets: 187 10*3/uL (ref 150–400)
RBC: 4.82 MIL/uL (ref 4.22–5.81)
RDW: 13.3 % (ref 11.5–15.5)
WBC: 7.3 10*3/uL (ref 4.0–10.5)

## 2016-09-10 LAB — HEPARIN LEVEL (UNFRACTIONATED)
HEPARIN UNFRACTIONATED: 0.27 [IU]/mL — AB (ref 0.30–0.70)
HEPARIN UNFRACTIONATED: 0.62 [IU]/mL (ref 0.30–0.70)

## 2016-09-10 LAB — HEMOGLOBIN A1C
Hgb A1c MFr Bld: 7.2 % — ABNORMAL HIGH (ref 4.8–5.6)
Mean Plasma Glucose: 160 mg/dL

## 2016-09-10 LAB — ECHOCARDIOGRAM COMPLETE
Height: 72 in
Weight: 3047.64 oz

## 2016-09-10 LAB — BRAIN NATRIURETIC PEPTIDE: B Natriuretic Peptide: 280.9 pg/mL — ABNORMAL HIGH (ref 0.0–100.0)

## 2016-09-10 MED ORDER — CLOPIDOGREL BISULFATE 300 MG PO TABS
300.0000 mg | ORAL_TABLET | ORAL | Status: AC
  Administered 2016-09-10: 300 mg via ORAL
  Filled 2016-09-10: qty 1

## 2016-09-10 MED ORDER — SODIUM CHLORIDE 0.9 % WEIGHT BASED INFUSION: 1.0000 mL/kg/h | INTRAVENOUS | Status: DC

## 2016-09-10 MED ORDER — SODIUM CHLORIDE 0.9% FLUSH: 3.0000 mL | Freq: Two times a day (BID) | INTRAVENOUS | Status: DC

## 2016-09-10 MED ORDER — CARVEDILOL 12.5 MG PO TABS
12.5000 mg | ORAL_TABLET | Freq: Two times a day (BID) | ORAL | Status: DC
Start: 2016-09-10 — End: 2016-09-11
  Administered 2016-09-10 – 2016-09-11 (×2): 12.5 mg via ORAL
  Filled 2016-09-10 (×3): qty 1

## 2016-09-10 MED ORDER — SODIUM CHLORIDE 0.9% FLUSH: 3.0000 mL | INTRAVENOUS | Status: DC | PRN

## 2016-09-10 MED ORDER — SODIUM CHLORIDE 0.9 % IV SOLN: 250.0000 mL | INTRAVENOUS | Status: DC | PRN

## 2016-09-10 MED ORDER — CLOPIDOGREL BISULFATE 75 MG PO TABS
75.0000 mg | ORAL_TABLET | Freq: Every day | ORAL | Status: DC
  Administered 2016-09-10 – 2016-09-11 (×2): 75 mg via ORAL
  Filled 2016-09-10 (×3): qty 1

## 2016-09-10 NOTE — Progress Notes (Signed)
Patient Name: Alan Diaz Swiss Date of Encounter: 09/10/2016    SUBJECTIVE: No complaints this morning. Denies dyspnea. Has been lost to medical follow-up after having an ostial LAD stent placed in 2016. He is noncompliant with medical therapy. Prior known severe LV systolic dysfunction. Prior to admission, 2 day history of exertional dyspnea and chest discomfort. History of RCA and LAD (ostial) stents. Recommendation was for dual antiplatelet therapy indefinitely.  TELEMETRY:  Normal sinus rhythm. Vitals:   09/09/16 1924 09/09/16 2307 09/10/16 0420 09/10/16 0807  BP: (!) 138/96 127/89 135/84 (!) 136/96  Pulse:   76 72  Resp: 16 20 18 18   Temp: 99.1 F (37.3 C) 99.3 F (37.4 C) 98.8 F (37.1 C) 98.5 F (36.9 C)  TempSrc: Oral Oral Oral Oral  SpO2: 98% 98% 97% 98%  Weight:      Height:        Intake/Output Summary (Last 24 hours) at 09/10/16 1034 Last data filed at 09/10/16 0809  Gross per 24 hour  Intake           835.61 ml  Output             2695 ml  Net         -1859.39 ml   LABS: Basic Metabolic Panel:  Recent Labs  16/09/9608/10/17 0427 09/10/16 0307  NA 139 138  K 3.9 3.1*  CL 108 101  CO2 25 29  GLUCOSE 113* 128*  BUN 16 11  CREATININE 1.34* 1.37*  CALCIUM 8.4* 8.8*   CBC:  Recent Labs  09/09/16 0427 09/10/16 0307  WBC 6.8 7.3  NEUTROABS 3.4  --   HGB 13.0 15.5  HCT 38.8* 45.3  MCV 93.7 94.0  PLT 168 187   Cardiac Enzymes:  Recent Labs  09/09/16 1003 09/09/16 1515 09/09/16 2120  TROPONINI 0.32* 0.31* 0.39*   BNP: Invalid input(s): POCBNP Hemoglobin A1C:  Recent Labs  09/09/16 1003  HGBA1C 7.2*   Fasting Lipid Panel:  Recent Labs  09/10/16 0307  CHOL 238*  HDL 55  LDLCALC 161*  TRIG 108  CHOLHDL 4.3   Cardiac catheterization and PCI data: May 2016 Conclusion    Mid LAD lesion, 40% stenosed.  Prox RCA to Mid RCA lesion, 40% stenosed.  Normal right heart and LV filling pressures.  Ost LAD to Prox LAD lesion, 90%  stenosed. There is a 0% residual stenosis post intervention.  A drug-eluting stent was placed.  Mid RCA to Dist RCA lesion, 95% stenosed. There is a 0% residual stenosis post intervention.  A drug-eluting stent was placed.   Diagnostic Diagram     Post-Intervention Diagram      Echocardiography 05/02/15: Study Conclusions  - Left ventricle: The cavity size was mildly dilated. Wall   thickness was increased in a pattern of mild LVH. Systolic   function was severely reduced. The estimated ejection fraction   was in the range of 25% to 30%. There is moderate hypokinesis of   the mid-apicalanteroseptal myocardium. Doppler parameters are   consistent with abnormal left ventricular relaxation (grade 1   diastolic dysfunction).  Radiology/Studies:  PA and lateral chest x-ray 09/09/16: IMPRESSION: Vascular congestion noted. Increased interstitial markings may reflect mild pulmonary edema or possibly pneumonia.  Physical Exam: Blood pressure (!) 136/96, pulse 72, temperature 98.5 F (36.9 C), temperature source Oral, resp. rate 18, height 6' (1.829 m), weight 190 lb 7.6 oz (86.4 kg), SpO2 98 %. Weight change: 10 lb 7.6 oz (4.753  kg)  Wt Readings from Last 3 Encounters:  09/09/16 190 lb 7.6 oz (86.4 kg)  05/12/15 177 lb 12.8 oz (80.6 kg)  05/05/15 181 lb 14.1 oz (82.5 kg)   Lying flat. No acute distress. Chest is clear Cardiac exam reveals no S4 gallop Extremities reveal no edema Neuro is intact  ASSESSMENT:  1. Coronary atherosclerosis with known prior ostial LAD stent and distal RCA stent. Elevated troponin on this occasion raises concern for unstable angina. 2. Non-ST elevation MI. 3. Diabetes mellitus 4. Chronic systolic heart failure. No recent LV assessment. Previously known to be less than 30% EF.  Plan:  1. 2-D Doppler echocardiogram. 2. Probably needs repeat coronary angiography because of elevated markers and ostial LAD stent. We need to document patency. 3.  Ambulate after reestablishing a strong medical regimen. 4. Discussed the importance of medication compliance/risk factor modification.  Signed, Lyn Records III 09/10/2016, 10:34 AM

## 2016-09-10 NOTE — Progress Notes (Signed)
Recommended cath after reviewing all data.  Echocardiogram with LVEF 35-40%. He refuses coronary angiography. Therefore, plan to resume medical therapy and encourage compliance. Transfer to telemetry. DC IV heparin once ambulatory.

## 2016-09-10 NOTE — Progress Notes (Signed)
  Echocardiogram 2D Echocardiogram has been performed.  Alan Diaz 09/10/2016, 2:46 PM

## 2016-09-10 NOTE — Progress Notes (Addendum)
ANTICOAGULATION CONSULT NOTE - Follow Up Consult  Pharmacy Consult for Heparin Indication: chest pain/ACS  Allergies  Allergen Reactions  . Aspirin Hives    Makes pt feel funny  . Other Nausea And Vomiting    Grape products    Patient Measurements: Height: 6' (182.9 cm) Weight: 190 lb 7.6 oz (86.4 kg) IBW/kg (Calculated) : 77.6 Heparin Dosing Weight: 86 kg  Labs:  Recent Labs  09/09/16 0427 09/09/16 1003  09/09/16 1515 09/09/16 1903 09/09/16 2120 09/10/16 0307 09/10/16 1519  HGB 13.0  --   --   --   --   --  15.5  --   HCT 38.8*  --   --   --   --   --  45.3  --   PLT 168  --   --   --   --   --  187  --   APTT 27  --   --   --   --   --   --   --   LABPROT 12.7  --   --   --   --   --   --   --   INR 0.95  --   --   --   --   --   --   --   HEPARINUNFRC  --   --   < >  --  0.17*  --  0.27* 0.62  CREATININE 1.34*  --   --   --   --   --  1.37*  --   TROPONINI  --  0.32*  --  0.31*  --  0.39*  --   --   < > = values in this interval not displayed.  Estimated Creatinine Clearance: 69.2 mL/min (by C-G formula based on SCr of 1.37 mg/dL).  Assessment:    Heparin level is therapeutic (0.62) this afternoon on 1600 units/hr, after increase from 1400 units/hr ~5am, when heparin level was low (0.27).    Noted plan to discontinue heparin when ambulatory, as currently refusing cath.     Goal of Therapy:  Heparin level 0.3-0.7 units/ml Monitor platelets by anticoagulation protocol: Yes   Plan:   Continue heparin drip at 1600 units/hr.  Daily heparin level and CBC while on heparin.  Will follow up for heparin stop time.  Dennie Fetters, [place] Pager: [phone] 09/10/2016,5:15 PM  Addendum:   Transferred from 2H to 3E.   New order to stop IV heparin in am   Added stop time of 8am on 09/11/16.   Heparin level already cancelled for am.  Hilarie Fredrickson, RPh 7:00 PM

## 2016-09-11 ENCOUNTER — Encounter (HOSPITAL_COMMUNITY)
Admission: EM | Disposition: A | Discharge status: Home / Self Care | Payer: Self-pay | Source: Home / Self Care | Attending: Internal Medicine

## 2016-09-11 ENCOUNTER — Encounter (HOSPITAL_COMMUNITY): Payer: Self-pay | Admitting: Student

## 2016-09-11 ENCOUNTER — Other Ambulatory Visit: Payer: Self-pay | Admitting: Physician Assistant

## 2016-09-11 ENCOUNTER — Telehealth: Payer: Self-pay | Admitting: *Deleted

## 2016-09-11 LAB — BASIC METABOLIC PANEL
ANION GAP: 11 (ref 5–15)
BUN: 14 mg/dL (ref 6–20)
CALCIUM: 8.8 mg/dL — AB (ref 8.9–10.3)
CO2: 26 mmol/L (ref 22–32)
Chloride: 100 mmol/L — ABNORMAL LOW (ref 101–111)
Creatinine, Ser: 1.34 mg/dL — ABNORMAL HIGH (ref 0.61–1.24)
GFR, EST NON AFRICAN AMERICAN: 59 mL/min — AB (ref >60)
GLUCOSE: 117 mg/dL — AB (ref 65–99)
POTASSIUM: 3.1 mmol/L — AB (ref 3.5–5.1)
Sodium: 137 mmol/L (ref 135–145)

## 2016-09-11 LAB — CBC
HCT: 43.7 % (ref 39.0–52.0)
HEMOGLOBIN: 14.8 g/dL (ref 13.0–17.0)
MCH: 31.9 pg (ref 26.0–34.0)
MCHC: 33.9 g/dL (ref 30.0–36.0)
MCV: 94.2 fL (ref 78.0–100.0)
Platelets: 183 10*3/uL (ref 150–400)
RBC: 4.64 MIL/uL (ref 4.22–5.81)
RDW: 13.4 % (ref 11.5–15.5)
WBC: 6.8 10*3/uL (ref 4.0–10.5)

## 2016-09-11 LAB — GLUCOSE, CAPILLARY
GLUCOSE-CAPILLARY: 123 mg/dL — AB (ref 65–99)
GLUCOSE-CAPILLARY: 159 mg/dL — AB (ref 65–99)

## 2016-09-11 SURGERY — RIGHT/LEFT HEART CATH AND CORONARY ANGIOGRAPHY

## 2016-09-11 MED ORDER — CARVEDILOL 12.5 MG PO TABS: 12.5000 mg | ORAL_TABLET | Freq: Two times a day (BID) | ORAL | 3 refills | Status: DC

## 2016-09-11 MED ORDER — CLOPIDOGREL BISULFATE 75 MG PO TABS: 75.0000 mg | ORAL_TABLET | Freq: Every day | ORAL | 3 refills | Status: DC

## 2016-09-11 MED ORDER — POTASSIUM CHLORIDE CRYS ER 20 MEQ PO TBCR
40.0000 meq | EXTENDED_RELEASE_TABLET | ORAL | Status: DC
  Filled 2016-09-11: qty 2

## 2016-09-11 MED ORDER — POTASSIUM CHLORIDE CRYS ER 20 MEQ PO TBCR: 40.0000 meq | EXTENDED_RELEASE_TABLET | Freq: Once | ORAL | Status: DC

## 2016-09-11 MED ORDER — POTASSIUM CHLORIDE CRYS ER 20 MEQ PO TBCR: 20.0000 meq | EXTENDED_RELEASE_TABLET | Freq: Every day | ORAL | 6 refills | Status: DC

## 2016-09-11 MED ORDER — POTASSIUM CHLORIDE CRYS ER 20 MEQ PO TBCR: 20.0000 meq | EXTENDED_RELEASE_TABLET | Freq: Every day | ORAL | Status: DC

## 2016-09-11 MED ORDER — FUROSEMIDE 40 MG PO TABS: 40.0000 mg | ORAL_TABLET | Freq: Every day | ORAL | 3 refills | Status: DC

## 2016-09-11 NOTE — Telephone Encounter (Signed)
-----   Message from Dewain Penning sent at 09/11/2016  1:06 PM EDT ----- Regarding: TOC Pt has an appt to see Katie on 9/19 @10 :30 am. Needs a phone call.  Thanks Trisha

## 2016-09-11 NOTE — Progress Notes (Signed)
Discharge Note:  Pt alert and oriented X 4 and in no distress.  Pt given discharge instructions regarding signs and symptoms to report, medications, diet, activity, and upcoming appointments. He verbalized understanding of all instructions.  Peripheral IV and telemetry discontinued.  Patient confirmed that he had all of his personal belongings.  Patient was transported out via wheelchair by hospital volunteer.

## 2016-09-11 NOTE — Discharge Summary (Signed)
Discharge Summary    Patient ID: Alan Diaz,  MRN: [id number], DOB/AGE: 1965-10-10 51 y.o.  Admit date: 09/09/2016 Discharge date: 09/11/2016  Primary Care Provider: No PCP Per Patient Primary Cardiologist: Dr. Tenny Craw   Discharge Diagnoses    Principal Problem:   Acute combined systolic and diastolic heart failure, NYHA class 4 (HCC) Active Problems:   Type 2 diabetes mellitus with circulatory disorder (HCC)   TOBACCO ABUSE   Essential hypertension   GERD (gastroesophageal reflux disease)   HLD (hyperlipidemia)   CAD- S/P PCI May 2016   Cardiomyopathy, ischemic   Non compliance w medication regimen   Elevated troponin   Allergies Allergies  Allergen Reactions  . Aspirin Hives    Makes pt feel funny  . Other Nausea And Vomiting    Grape products     History of Present Illness     Alan Diaz is a 51 y.o. male with a history of  HTN, DM, GERD, tobacco abuse, CAD s/p DES to pLAD and dRCA, chronic combined S/D CHF and medical non compliance who presented to [facility] on 09/09/16 with chest pain and SOB.   He was admitted 05/02/15-05/05/15 with acute on chronic systolic/diastolic CHF and NSTEMI s/p DES to pLAD and dRCA. A  2D ECHO 05/02/15 revealed EF 25-30%, mild LV dilation. Mild LVH, mod HKofthe mid-apicalanteroseptal myocardium, G1DD. Not felt to be high risk so LifeVest was not placed. His troponin was also noted to be mildly elevated ( .0.07--> 0.05-->  0.04--> 0.05.). Given severity of LV dysfunction he was set up for [facility] on 05/03/15 which revealed  Mid LAD lesion, 40% stenosed.  Prox RCA to Mid RCA lesion, 40% stenosed.  Normal right heart and LV filling pressures.  Ost LAD to Prox LAD lesion, 90% stenosed s/p DES  Mid RCA to Dist RCA lesion, 95% stenosed s/p DES He was placed on Brilinta indefinitely since patient is ASA allergic.  He had some issues controlling his BP while inpatient and required NTG gtt. He then stabilized and did well on lisinopril 10mg  and  coreg 6.25mg  BID  I saw him in clinic for follow up on 05/10/16 and he was doing well. He was then lost to follow up and stopped taking all of his medications. He denied any chest pain or unusual dyspnea till 2 days prior to admission. He had not taken any of his medications in months, still smokes, and doesn't follow a low sodium diet. He has noticed some "wheezing" for two days and became increasingly SOB last 24 hrs prior to presentation. CXR confirms CHF, BNP 407, Troponin 0.2 He was admitted for further work up.     Hospital Course     Consultants: none  Ischemic cardiomyopathy/acute on chronic systolic/diastolic CHF: he presented with acute CHF with elevated BNP and CHF on CXR and was diuresed with IV lasix. Net neg 2.8L. Discharge weight 184 lbs. -- 2D ECHO this admission EF improved 35-40%, mod diffuse HK with no identifiable regional variations, G2DD, mild LAE. -- Continue lisinopril 10mg  and coreg 12.5mg  BID. Discharged on Lasix 40mg  qd. BMET at follow up.  CAD/NSTEMI s/p DES to pLAD and dRCA (05/03/15). He was discharged on Brilinta but has not been taking for over a year -- Cardiac markers were mildly elevated 0.32--> 0.31--> 0.39 and cath was recommended but patient refused -- Continue plavix indefinitely since patient is ASA allergic. Continue BB, statin and ACE.   HLD: LDL 161.Continue Lipitor 80mg  daily.  Tobacco abuse: he continues  to smoke. Counseled on cessation  DM: A1C 7.2, Continue metformin  HTN: BP well controlled on lisinopril 10mg  and coreg 12.5mg  BID  Hypokalemia: will supplement, BMET at follow up  Medical non compliance: we again discussed the importance of his understanding and being accountable for his medical problems and therapy. We have recommended that he follow-up closely with cardiology  The patient has had an uncomplicated hospital course and is recovering well. He has been seen by Dr. Katrinka Blazing today and deemed ready for discharge home. All  follow-up appointments have been scheduled. Smoking cessation was disscussed in length. Discharge medications are listed below.  _____________  Discharge Vitals Blood pressure 110/76, pulse 82, temperature 98.6 F (37 C), temperature source Oral, resp. rate 18, height 6' (1.829 m), weight 184 lb 3.2 oz (83.6 kg), SpO2 97 %.  Filed Weights   09/09/16 0900 09/10/16 1843 09/11/16 0533  Weight: 190 lb 7.6 oz (86.4 kg) 187 lb 3.2 oz (84.9 kg) 184 lb 3.2 oz (83.6 kg)    Labs & Radiologic Studies     CBC  Recent Labs  09/09/16 0427 09/10/16 0307 09/11/16 0355  WBC 6.8 7.3 6.8  NEUTROABS 3.4  --   --   HGB 13.0 15.5 14.8  HCT 38.8* 45.3 43.7  MCV 93.7 94.0 94.2  PLT 168 187 183   Basic Metabolic Panel  Recent Labs  09/10/16 0307 09/11/16 0345  NA 138 137  K 3.1* 3.1*  CL 101 100*  CO2 29 26  GLUCOSE 128* 117*  BUN 11 14  CREATININE 1.37* 1.34*  CALCIUM 8.8* 8.8*   Liver Function Tests  Recent Labs  09/09/16 0427  AST 16  ALT 14*  ALKPHOS 68  BILITOT 0.5  PROT 6.4*  ALBUMIN 3.4*   No results for input(s): LIPASE, AMYLASE in the last 72 hours. Cardiac Enzymes  Recent Labs  09/09/16 1003 09/09/16 1515 09/09/16 2120  TROPONINI 0.32* 0.31* 0.39*   BNP Invalid input(s): POCBNP D-Dimer No results for input(s): DDIMER in the last 72 hours. Hemoglobin A1C  Recent Labs  09/09/16 1003  HGBA1C 7.2*   Fasting Lipid Panel  Recent Labs  09/10/16 0307  CHOL 238*  HDL 55  LDLCALC 161*  TRIG 108  CHOLHDL 4.3   Thyroid Function Tests  Recent Labs  09/09/16 1003  TSH 0.997    Dg Chest 2 View  Result Date: 09/09/2016 CLINICAL DATA:  Acute onset of shortness of breath. Initial encounter. EXAM: CHEST  2 VIEW COMPARISON:  Chest radiograph performed 05/02/2015 FINDINGS: The lungs are well-aerated. Vascular congestion is noted. Increased interstitial markings may reflect mild pulmonary edema or possibly pneumonia. There is no evidence of pleural  effusion or pneumothorax. The heart is normal in size; the mediastinal contour is within normal limits. No acute osseous abnormalities are seen. IMPRESSION: Vascular congestion noted. Increased interstitial markings may reflect mild pulmonary edema or possibly pneumonia. Electronically Signed   By: Roanna Raider M.D.   On: 09/09/2016 04:22     Diagnostic Studies/Procedures    2D ECHO: 09/10/2016 LV EF: 35-40% Study Conclusions - Left ventricle: The cavity size was moderately dilated. Wall   thickness was normal. Systolic function was moderately reduced.   The estimated ejection fraction was in the range of 35% to 40%.   Moderate diffuse hypokinesis with no identifiable regional   variations. Features are consistent with a pseudonormal left   ventricular filling pattern, with concomitant abnormal relaxation   and increased filling pressure (grade 2 diastolic dysfunction). -  Left atrium: The atrium was mildly dilated. Impressions: - Compared to 2016, LVEF has improved.  _____________    Disposition   Pt is being discharged home today in good condition.  Follow-up Plans & Appointments    Follow-up Information    Cline CrockKathryn Thompson, PA-C Follow up on 09/18/2016.   Specialties:  Cardiology, Radiology Why:  @ 10:15 am.  Contact information: [address] [place] [id number] [phone]            Discharge Medications     Medication List    STOP taking these medications   ticagrelor 90 MG Tabs tablet Commonly known as:  BRILINTA     TAKE these medications   atorvastatin 80 MG tablet Commonly known as:  LIPITOR Take 1 tablet (80 mg total) by mouth daily at 6 PM.   carvedilol 12.5 MG tablet Commonly known as:  COREG Take 1 tablet (12.5 mg total) by mouth 2 (two) times daily with a meal. What changed:  medication strength  how much to take   clopidogrel 75 MG tablet Commonly known as:  PLAVIX Take 1 tablet (75 mg total) by mouth  daily.   furosemide 40 MG tablet Commonly known as:  LASIX Take 1 tablet (40 mg total) by mouth daily. What changed:  medication strength  how much to take   lisinopril 10 MG tablet Commonly known as:  PRINIVIL,ZESTRIL Take 1 tablet (10 mg total) by mouth daily.   metFORMIN 500 MG tablet Commonly known as:  GLUCOPHAGE Take 1 tablet (500 mg total) by mouth 2 (two) times daily with a meal.   nitroGLYCERIN 0.4 MG SL tablet Commonly known as:  NITROSTAT Place 1 tablet (0.4 mg total) under the tongue every 5 (five) minutes as needed for chest pain.   potassium chloride SA 20 MEQ tablet Commonly known as:  K-DUR,KLOR-CON Take 1 tablet (20 mEq total) by mouth daily. Start taking on:  09/12/2016       Aspirin prescribed at discharge?  No: allergic High Intensity Statin Prescribed? (Lipitor 40-80mg  or Crestor 20-40mg ): Yes Beta Blocker Prescribed? Yes For EF 45% or less, Was ACEI/ARB Prescribed? Yes ADP Receptor Inhibitor Prescribed? (i.e. Plavix etc.-Includes Medically Managed Patients): Yes For EF <45%, Aldosterone Inhibitor Prescribed? No:  Can be added as outpatient. Was EF assessed during THIS hospitalization? Yes Was Cardiac Rehab II ordered? (Included Medically managed Patients): No   Outstanding Labs/Studies   BMET  Duration of Discharge Encounter   Greater than 30 minutes including physician time.  Signed, Cline CrockKathryn Thompson PA-C 09/11/2016, 1:06 PM The patient has been seen in conjunction with Carlean JewsKatie Thompson, PAC. All aspects of care have been considered and discussed. The patient has been personally interviewed, examined, and all clinical data has been reviewed.  1. Ace/beta blocker combination for heart failure. Can be optimized as outpatient. Diuretic therapy for symptom control of heart failure. 2. Dual antiplatelet therapy is resumed and the patient is encouraged to continue. 3. Aggressive blood pressure control 4. Ready for discharge 5. Potassium  supplementation completed this morning. 6. Dr. Tenny Crawoss is primary cardiologist.

## 2016-09-11 NOTE — Progress Notes (Signed)
Pt's order for cardiac cath is still sign and held as patient refused to have cath at this point, IV heparin continue, no any sign of acute distress and SOB noted overnight, will continue to monitor

## 2016-09-11 NOTE — Progress Notes (Addendum)
Patient Name: Alan Diaz Date of Encounter: 09/11/2016    SUBJECTIVE: The patient is doing well. We again discussed the importance of his understanding and being accountable for his medical problems and therapy. I've recommended that he follow-up closely with cardiology  States that his breathing is back to normal. Denies chest pain. Still adamant about not performing coronary angiography despite the elevated cardiac markers.  TELEMETRY:  Normal sinus rhythm. Vitals:   09/10/16 2003 09/11/16 0533 09/11/16 0840 09/11/16 0902  BP: 120/75 134/82 119/67 119/67  Pulse: 64 65 67 67  Resp: 18 18 18    Temp: 98.8 F (37.1 C) 98 F (36.7 C) 98 F (36.7 C)   TempSrc: Oral Oral Oral   SpO2: 96% 98% 97%   Weight:  184 lb 3.2 oz (83.6 kg)    Height:        Intake/Output Summary (Last 24 hours) at 09/11/16 0951 Last data filed at 09/10/16 2005  Gross per 24 hour  Intake              352 ml  Output              800 ml  Net             -448 ml   LABS: Basic Metabolic Panel:  Recent Labs  09/32/35 0307 09/11/16 0345  NA 138 137  K 3.1* 3.1*  CL 101 100*  CO2 29 26  GLUCOSE 128* 117*  BUN 11 14  CREATININE 1.37* 1.34*  CALCIUM 8.8* 8.8*   CBC:  Recent Labs  09/09/16 0427 09/10/16 0307 09/11/16 0355  WBC 6.8 7.3 6.8  NEUTROABS 3.4  --   --   HGB 13.0 15.5 14.8  HCT 38.8* 45.3 43.7  MCV 93.7 94.0 94.2  PLT 168 187 183   Cardiac Enzymes:  Recent Labs  09/09/16 1003 09/09/16 1515 09/09/16 2120  TROPONINI 0.32* 0.31* 0.39*   BNP: Invalid input(s): POCBNP Hemoglobin A1C:  Recent Labs  09/09/16 1003  HGBA1C 7.2*   Fasting Lipid Panel:  Recent Labs  09/10/16 0307  CHOL 238*  HDL 55  LDLCALC 161*  TRIG 108  CHOLHDL 4.3   Echocardiogram performed on 09/10/16: Study Conclusions  - Left ventricle: The cavity size was moderately dilated. Wall   thickness was normal. Systolic function was moderately reduced.   The estimated ejection  fraction was in the range of 35% to 40%.   Moderate diffuse hypokinesis with no identifiable regional   variations. Features are consistent with a pseudonormal left   ventricular filling pattern, with concomitant abnormal relaxation   and increased filling pressure (grade 2 diastolic dysfunction). - Left atrium: The atrium was mildly dilated.  Impressions:  - Compared to 2016, LVEF has improved.  Radiology/Studies:  No new data  Physical Exam: Blood pressure 119/67, pulse 67, temperature 98 F (36.7 C), temperature source Oral, resp. rate 18, height 6' (1.829 m), weight 184 lb 3.2 oz (83.6 kg), SpO2 97 %. Weight change: -3 lb 4.4 oz (-1.487 kg)  Wt Readings from Last 3 Encounters:  09/11/16 184 lb 3.2 oz (83.6 kg)  05/12/15 177 lb 12.8 oz (80.6 kg)  05/05/15 181 lb 14.1 oz (82.5 kg)   Chest is clear Cardiac exam reveals an S4 gallop. An apical systolic murmur is heard. Abdomen is soft. Bowel sounds are normal.  ASSESSMENT:  1. Chronic combined systolic and diastolic heart failure, with updated echo demonstrating LVEF 35-40%. This is dramatically improved  compared to 2016 when EF was felt to be in the 25% range. Dyspnea has improved with reinstitution of diuretic therapy this admission. Patient has been noncompliant with heart failure therapy for multiple months (since November).  2. Acute coronary syndrome with flat but elevated cardiac markers in a patient with high risk coronary substrate including an ostial LAD drug-eluting stent and a mid right coronary drug-eluting stent. Patient discontinued dual antiplatelet therapy in November 2016. The interventional provider felt that day was necessary in definitely given the location of the LAD stent.  3. Essential hypertension  4. Hyperlipidemia  5. Hypokalemia  Plan:  1. Ace/beta blocker combination for heart failure. Can be optimized as outpatient. Diuretic therapy for symptom control of heart failure. 2. Dual antiplatelet  therapy is resumed and the patient is encouraged to continue. 3. Aggressive blood pressure control 4. Ambulate and if no difficulty discharge later today if vital signs are stable and medications well tolerated. 5. Potassium supplementation 6. Dr. Tenny Crawoss is primary cardiologist.   Signed, Lyn RecordsHenry W Smith III 09/11/2016, 9:51 AM

## 2016-09-12 NOTE — Telephone Encounter (Signed)
LMTCB.  TCM pt 

## 2016-09-13 NOTE — Telephone Encounter (Signed)
Left a message for the pt to call back for TCM follow-up. 

## 2016-09-14 NOTE — Telephone Encounter (Signed)
Lm on VM regarding appointment 9/19 at 10:30 w/Katie Thompson,PA. Advised to call back since he is post hospital.

## 2016-09-16 NOTE — Progress Notes (Signed)
Cardiology Office Note    Date:  09/18/2016   ID:  Alan Diaz, DOB Mar 18, 1965, MRN [id number]  PCP:  No PCP Per Patient  Cardiologist: Dr. Tenny Craw  CC: post hosp fu  History of Present Illness:  Alan Diaz is a 51 y.o. male with a history of HTN, DM, GERD, tobacco abuse, CAD s/p DES to pLAD and dRCA (05/2015), chronic combined S/D CHF and medical non compliance who presents to clinic for post hospital follow up.  He was admitted 05/02/15-05/05/15 with acute on chronic systolic/diastolic CHF and NSTEMI s/p DES to pLAD and dRCA. A 2D ECHO 05/02/15 revealed EF 25-30%, mild LV dilation. Mild LVH, mod HKofthe mid-apicalanteroseptal myocardium, G1DD. Not felt to be high risk so LifeVest was not placed. His troponin was also noted to be mildly elevated ( .0.07--> 0.05--> 0.04-->0.05.). Given severity of LV dysfunction he was set up for [facility] on 05/03/15 which revealed  Mid LAD lesion, 40% stenosed.  Prox RCA to Mid RCA lesion, 40% stenosed.  Normal right heart and LV filling pressures.  Ost LAD to Prox LAD lesion, 90% stenosed s/p DES  Mid RCA to Dist RCA lesion, 95% stenosed s/p DES He was placed on Brilinta indefinitely since patient is ASA allergic. He had some issues controlling his BP while inpatient and required NTG gtt. He then stabilized and did well on lisinopril 10mg  and coreg 6.25mg  BID  I saw him in clinic for follow up on 05/10/16 and he was doing well. He was then lost to follow up and stopped taking all of his medications. He denied any chest pain or unusual dyspnea till 2 days prior to admission. He had not taken any of his medications in months, still smokes, and doesn't follow a low sodium diet. He has noticed some "wheezing" for two days and became increasingly SOB last 24 hrs prior to presentation. CXR confirms CHF, BNP 407, Troponin 0.2 He was admitted for further work up.   He was admitted 9/10-9/12/17 with chest pain and SOB. He presented with acute CHF with  elevated BNP and CHF on CXR and was diuresed with IV lasix. Net neg 2.8L. Discharge weight 184 lbs. 2D ECHO this admission showed EF improved 35-40%, mod diffuse HK with no identifiable regional variations, G2DD, mild LAE. He was put back on lisinopril 10mg , coreg 12.5mg  BID and Lasix 40mg  qd. His troponin was mildly elevated and cath recommended but patient refused. He was placed back on plavix only due to ASA allergy.   Today he presents to clinic for follow up. Since leaving the hospital he has been feeling okay. He has felt a little tired but getting more and more energy. He tried to cut his front yard with a push mover and felt great- he even cut his neighbors lawn. He brought his nitro but did very well and did not have to use. No chest pain at all and breathing much better. Watching salt. Weighing daily. Appetite is better. No LE edema, orthopnea or PND. No dizziness or syncope. He has been taking all of his medications as prescribed. He has now learned his lesson about taking his meds. He stopped taking them with all the stress of his father dying, but now he is back on track and wants to be complaint. Quit smoking.     Past Medical History:  Diagnosis Date  . CAD (coronary artery disease)    a. NSTEMI s/p DES to pLAD and dRCA  . Diabetes mellitus   . GERD (gastroesophageal  reflux disease)   . HLD (hyperlipidemia)   . Hypertension   . Ischemic cardiomyopathy    a. 2D ECHO 05/02/15 with EF 25-30%, mild LV dilation. Mild LVH, mod HKof the mid-apicalanteroseptal myocardium, G1DD  . Tobacco abuse     Past Surgical History:  Procedure Laterality Date  . CARDIAC CATHETERIZATION N/A 05/03/2015   Procedure: Right/Left Heart Cath and Coronary Angiography;  Surgeon: Peter M SwazilandJordan, MD;  [facility]: [facility]VAS[facility] CV [facility]UP[facility];  Service: Cardiovascular;  Laterality: N/A;  . CARDIAC CATHETERIZATION N/A 05/03/2015   Procedure: Intravascular Ultrasound/IVUS;  Surgeon: Peter M SwazilandJordan, MD;  Location: [facility]  INVASIVE CV LAB CUPID;  Service: Cardiovascular;  Laterality: N/A;  . CARDIAC CATHETERIZATION N/A 05/03/2015   Procedure: Coronary Stent Intervention;  Surgeon: Peter M SwazilandJordan, MD;  Location: [facility]VAS[facility] CV [facility];  Service: Cardiovascular;  Laterality: N/A;  rca  . PERCUTANEOUS CORONARY STENT INTERVENTION (PCI-S)  05/03/2015   Procedure: Percutaneous Coronary Stent Intervention (Pci-S);  Surgeon: Peter M SwazilandJordan, MD;  [facility];  Service: Cardiovascular;;  Prox LAD  . WISDOM TOOTH EXTRACTION     bottom LT, top RT    Current Medications: Outpatient Medications Prior to Visit  Medication Sig Dispense Refill  . atorvastatin (LIPITOR) 80 MG tablet TAKE 1 TABLET BY MOUTH DAILY AT 6 PM 30 tablet 1  . carvedilol (COREG) 12.5 MG tablet Take 1 tablet (12.5 mg total) by mouth 2 (two) times daily with a meal. 60 tablet 3  . clopidogrel (PLAVIX) 75 MG tablet Take 1 tablet (75 mg total) by mouth daily. 90 tablet 3  . furosemide (LASIX) 40 MG tablet Take 1 tablet (40 mg total) by mouth daily. 30 tablet 3  . lisinopril (PRINIVIL,ZESTRIL) 10 MG tablet TAKE 1 TABLET BY MOUTH DAILY 30 tablet 1  . nitroGLYCERIN (NITROSTAT) 0.4 MG SL tablet Place 1 tablet (0.4 mg total) under the tongue every 5 (five) minutes as needed for chest pain. 25 tablet 12  . potassium chloride SA (K-DUR,KLOR-CON) 20 MEQ tablet Take 1 tablet (20 mEq total) by mouth daily. 30 tablet 6  . metFORMIN (GLUCOPHAGE) 500 MG tablet Take 1 tablet (500 mg total) by mouth 2 (two) times daily with a meal. 60 tablet 11   No facility-administered medications prior to visit.      Allergies:   Aspirin and Other   Social History   Social History  . Marital status: Divorced    Spouse name: N/A  . Number of children: N/A  . Years of education: N/A   Social History Main Topics  . Smoking status: Former Smoker    Packs/day: 0.25    Types: Cigarettes    Quit date: 05/03/2015  . Smokeless tobacco: Never Used  . Alcohol use  No     Comment: very occasionally  . Drug use: No     Comment: denies 06/28/14  . Sexual activity: Not Asked   Other Topics Concern  . None   Social History Narrative  . None     Family History:  The patient's family history includes Breast cancer in his mother; Diabetes in his brother and sister; Heart disease in his father; Hypertension in his brother and sister.     ROS:   Please see the history of present illness.    ROS All other systems reviewed and are negative.   PHYSICAL EXAM:   VS:  BP 120/78   Pulse 72   Ht 6' (1.829 m)   Hartford FinancialWt  191 lb 12.8 oz (87 kg)   SpO2 96%   BMI 26.01 kg/m    GEN: Well nourished, well developed, in no acute distress  HEENT: normal  Neck: no JVD, carotid bruits, or masses Cardiac: RRR; no murmurs, rubs, or gallops,no edema  Respiratory:  clear to auscultation bilaterally, normal work of breathing GI: soft, nontender, nondistended, + BS MS: no deformity or atrophy  Skin: warm and dry, no rash Neuro:  Alert and Oriented x 3, Strength and sensation are intact Psych: euthymic mood, full affect  Wt Readings from Last 3 Encounters:  09/18/16 191 lb 12.8 oz (87 kg)  09/11/16 184 lb 3.2 oz (83.6 kg)  05/12/15 177 lb 12.8 oz (80.6 kg)      Studies/Labs Reviewed:   EKG:  EKG is NOT ordered today.    Recent Labs: 09/09/2016: ALT 14; TSH 0.997 09/10/2016: B Natriuretic Peptide 280.9 09/11/2016: BUN 14; Creatinine, Ser 1.34; Hemoglobin 14.8; Platelets 183; Potassium 3.1; Sodium 137   Lipid Panel    Component Value Date/Time   CHOL 238 (H) 09/10/2016 0307   TRIG 108 09/10/2016 0307   HDL 55 09/10/2016 0307   CHOLHDL 4.3 09/10/2016 0307   VLDL 22 09/10/2016 0307   LDLCALC 161 (H) 09/10/2016 0307    Additional studies/ records that were reviewed today include:  2D ECHO: 09/10/2016 LV EF: 35-40% Study Conclusions - Left ventricle: The cavity size was moderately dilated. Wall thickness was normal. Systolic function was moderately  reduced. The estimated ejection fraction was in the range of 35% to 40%. Moderate diffuse hypokinesis with no identifiable regional variations. Features are consistent with a pseudonormal left ventricular filling pattern, with concomitant abnormal relaxation and increased filling pressure (grade 2 diastolic dysfunction). - Left atrium: The atrium was mildly dilated. Impressions: - Compared to 2016, LVEF has improved.  _____________    ASSESSMENT & PLAN:   Ischemic cardiomyopathy/acute on chronic systolic/diastolic CHF:  -- 2D ECHO on most recent admission showed EF improved to 35-40% with mod diffuse HK with no identifiable regional variations, G2DD, mild LAE. Appears euvolemic today. Went over salt and fluid restrictions.   -- Continue lisinopril 10mg , coreg 12.5mg  BID and Lasix 40mg  qd. BMET today  CAD/NSTEMIs/p DES to pLAD and dRCA (05/03/15). -- Cardiac markers were mildly elevated 0.32--> 0.31--> 0.39 on most recent admission and cath was recommended but patient refused -- Continue plavix indefinitely since patient is ASA allergic. Continue BB, statin and ACE.   HLD: LDL 161.Continue Lipitor 80mg  daily.  Tobacco abuse: he has quit. Counseled on continued cessation  DM:A1C 7.2, Continue metformin. Will give him refills until he can find a PCP  HTN: BP well controlled on lisinopril 10mg  and coreg 12.5mg  BID  Hypokalemia: BMET today  Medical non compliance: we again discussed the importance of his understanding and being accountable for his medical problems and therapy. He has now " learned his lesson. "    Medication Adjustments/Labs and Tests Ordered: Current medicines are reviewed at length with the patient today.  Concerns regarding medicines are outlined above.  Medication changes, Labs and Tests ordered today are listed in the Patient Instructions below. Patient Instructions  Medication Instructions:  Your physician recommends that you continue  on your current medications as directed. Please refer to the Current Medication list given to you today.   Labwork: TODAY:  BMET  Testing/Procedures: None ordered  Follow-Up: Your physician recommends that you schedule a follow-up appointment in: 3 MONTHS WITH DR. ROSS  Any Other Special Instructions  Will Be Listed Below (If Applicable).     If you need a refill on your cardiac medications before your next appointment, please call your pharmacy.      Signed, Cline Crock, PA-C  09/18/2016 12:01 PM    [facility] [address] [place], [place], [place]  [address] Phone: [phone]; Fax: [phone]

## 2016-09-17 ENCOUNTER — Encounter: Payer: Self-pay | Admitting: Physician Assistant

## 2016-09-18 ENCOUNTER — Ambulatory Visit (INDEPENDENT_AMBULATORY_CARE_PROVIDER_SITE_OTHER): Payer: Medicaid Other | Admitting: Physician Assistant

## 2016-09-18 ENCOUNTER — Encounter: Payer: Self-pay | Admitting: Physician Assistant

## 2016-09-18 VITALS — BP 120/78 | HR 72 | Ht 72.0 in | Wt 191.8 lb

## 2016-09-18 DIAGNOSIS — E118 Type 2 diabetes mellitus with unspecified complications: Secondary | ICD-10-CM

## 2016-09-18 DIAGNOSIS — I214 Non-ST elevation (NSTEMI) myocardial infarction: Secondary | ICD-10-CM

## 2016-09-18 DIAGNOSIS — Z91199 Patient's noncompliance with other medical treatment and regimen due to unspecified reason: Secondary | ICD-10-CM

## 2016-09-18 DIAGNOSIS — I10 Essential (primary) hypertension: Secondary | ICD-10-CM

## 2016-09-18 DIAGNOSIS — E785 Hyperlipidemia, unspecified: Secondary | ICD-10-CM

## 2016-09-18 DIAGNOSIS — Z9119 Patient's noncompliance with other medical treatment and regimen: Secondary | ICD-10-CM

## 2016-09-18 DIAGNOSIS — I5041 Acute combined systolic (congestive) and diastolic (congestive) heart failure: Secondary | ICD-10-CM | POA: Diagnosis not present

## 2016-09-18 DIAGNOSIS — Z720 Tobacco use: Secondary | ICD-10-CM

## 2016-09-18 MED ORDER — METFORMIN HCL 500 MG PO TABS: 500.0000 mg | ORAL_TABLET | Freq: Two times a day (BID) | ORAL | 3 refills | Status: DC

## 2016-09-18 NOTE — Patient Instructions (Addendum)
Medication Instructions:  Your physician recommends that you continue on your current medications as directed. Please refer to the Current Medication list given to you today.   Labwork: TODAY:  BMET  Testing/Procedures: None ordered  Follow-Up: Your physician recommends that you schedule a follow-up appointment in: 3 MONTHS WITH DR. ROSS   Any Other Special Instructions Will Be Listed Below (If Applicable).     If you need a refill on your cardiac medications before your next appointment, please call your pharmacy.   

## 2016-09-19 LAB — BASIC METABOLIC PANEL
BUN: 17 mg/dL (ref 7–25)
CALCIUM: 9 mg/dL (ref 8.6–10.3)
CO2: 28 mmol/L (ref 20–31)
CREATININE: 1.28 mg/dL (ref 0.70–1.33)
Chloride: 99 mmol/L (ref 98–110)
Glucose, Bld: 216 mg/dL — ABNORMAL HIGH (ref 65–99)
Potassium: 4.5 mmol/L (ref 3.5–5.3)
Sodium: 137 mmol/L (ref 135–146)

## 2016-12-17 ENCOUNTER — Ambulatory Visit (INDEPENDENT_AMBULATORY_CARE_PROVIDER_SITE_OTHER): Payer: Medicaid Other | Admitting: Internal Medicine

## 2016-12-17 ENCOUNTER — Encounter: Payer: Self-pay | Admitting: Internal Medicine

## 2016-12-17 ENCOUNTER — Encounter (INDEPENDENT_AMBULATORY_CARE_PROVIDER_SITE_OTHER): Payer: Self-pay

## 2016-12-17 VITALS — BP 144/88 | HR 78 | Ht 72.0 in | Wt 202.8 lb

## 2016-12-17 DIAGNOSIS — E785 Hyperlipidemia, unspecified: Secondary | ICD-10-CM | POA: Diagnosis not present

## 2016-12-17 DIAGNOSIS — Z9861 Coronary angioplasty status: Secondary | ICD-10-CM | POA: Diagnosis not present

## 2016-12-17 DIAGNOSIS — I2510 Atherosclerotic heart disease of native coronary artery without angina pectoris: Secondary | ICD-10-CM

## 2016-12-17 LAB — LIPID PANEL
Cholesterol: 268 mg/dL — ABNORMAL HIGH (ref <200)
HDL: 50 mg/dL (ref >40)
LDL Cholesterol: 192 mg/dL — ABNORMAL HIGH (ref <100)
Total CHOL/HDL Ratio: 5.4 ratio — ABNORMAL HIGH (ref <5.0)
Triglycerides: 130 mg/dL (ref <150)
VLDL: 26 mg/dL (ref <30)

## 2016-12-17 LAB — CBC
HEMATOCRIT: 41.8 % (ref 38.5–50.0)
HEMOGLOBIN: 13.9 g/dL (ref 13.2–17.1)
MCH: 32.1 pg (ref 27.0–33.0)
MCHC: 33.3 g/dL (ref 32.0–36.0)
MCV: 96.5 fL (ref 80.0–100.0)
MPV: 11 fL (ref 7.5–12.5)
Platelets: 224 10*3/uL (ref 140–400)
RBC: 4.33 MIL/uL (ref 4.20–5.80)
RDW: 14.2 % (ref 11.0–15.0)
WBC: 5.4 10*3/uL (ref 3.8–10.8)

## 2016-12-17 LAB — BASIC METABOLIC PANEL WITH GFR
BUN: 15 mg/dL (ref 7–25)
CO2: 25 mmol/L (ref 20–31)
Calcium: 9 mg/dL (ref 8.6–10.3)
Chloride: 103 mmol/L (ref 98–110)
Creat: 1.21 mg/dL (ref 0.70–1.33)
Glucose, Bld: 183 mg/dL — ABNORMAL HIGH (ref 65–99)
Potassium: 4.4 mmol/L (ref 3.5–5.3)
Sodium: 136 mmol/L (ref 135–146)

## 2016-12-17 NOTE — Progress Notes (Signed)
Cardiology Office Note   Date:  12/17/2016   ID:  Alan LuzRodney Igoe, DOB July 15, 1965, MRN [id number]  PCP:  No PCP Per Patient  Cardiologist:   Dietrich PatesPaula Caspian Deleonardis, MD    F/U of HTN and CAD   History of Present Illness: Alan Diaz is a 51 y.o. male with a history of HTN, DM, GERD, tobacco abuse, CADs/p DES to pLAD and dRCA (05/2015), chronic combined S/D CHF and medical non compliance who presents to clinic for post hospital follow up.  He was admitted 05/02/15-05/05/15 with acute on chronic systolic/diastolic CHF and NSTEMI s/p DES to pLAD and dRCA. A 2D ECHO 05/02/15 revealed EF 25-30%, mild LV dilation. Mild LVH, mod HKofthe mid-apicalanteroseptal myocardium, G1DD. Not felt to be high risk so LifeVest was not placed. His troponin was also noted to be mildly elevated ( .0.07-->0.05-->0.04-->0.05.). Given severity of LV dysfunction [facility] revealed  Mid LAD lesion, 40% stenosed.  Prox RCA to Mid RCA lesion, 40% stenosed.  Normal right heart and LV filling pressures.  Ost LAD to Prox LAD lesion, 90% stenosed s/p DES  Mid RCA to Dist RCA lesion, 95% stenosed s/p DES He was placed on Brilinta indefinitely since patient is ASA allergic. He had some issues controlling his BP while inpatient and required NTG gtt. He then stabilized and did well on lisinopril 10mg  and coreg 6.25mg  BID  I saw him in clinic for follow up on 05/10/16 and he was doing well. He was then lost to follow up and stopped taking all of his medications. He deniedany chest pain or unusual dyspnea till 2 days prior to admission. He had not taken any of his medications in months, still smokes, and doesn't follow a low sodium diet. He has noticed some "wheezing" for two days and became increasingly SOB last 24 hrs prior to presentation. CXR confirms CHF, BNP 407, Troponin 0.2 He was admitted for further work up.   He was admitted 9/10-9/12/17 with chest pain and SOB. He presented with acute  CHF with elevated BNP and CHF on CXR and was diuresed with IV lasix. Net neg 2.8L. Discharge weight 184 lbs. 2D ECHO this admission showed EF improved 35-40%, mod diffuse HK with no identifiable regional variations, G2DD, mild LAE. He was put back on lisinopril 10mg , coreg 12.5mg  BID and Lasix 40mg  qd. His troponin was mildly elevated and cath recommended but patient refused. He was placed back on plavix only due to ASA allergy.   Since seen by Philomena CourseK Thompson in Sept he says he has been feeling good  NO CP  Breathing is OK  No dizziness  Says he is taking meds     Current Meds  Medication Sig  . atorvastatin (LIPITOR) 80 MG tablet TAKE 1 TABLET BY MOUTH DAILY AT 6 PM  . carvedilol (COREG) 12.5 MG tablet Take 1 tablet (12.5 mg total) by mouth 2 (two) times daily with a meal.  . clopidogrel (PLAVIX) 75 MG tablet Take 1 tablet (75 mg total) by mouth daily.  . furosemide (LASIX) 40 MG tablet Take 1 tablet (40 mg total) by mouth daily.  Marland Kitchen. lisinopril (PRINIVIL,ZESTRIL) 10 MG tablet TAKE 1 TABLET BY MOUTH DAILY  . metFORMIN (GLUCOPHAGE) 500 MG tablet Take 1 tablet (500 mg total) by mouth 2 (two) times daily with a meal.  . nitroGLYCERIN (NITROSTAT) 0.4 MG SL tablet Place 1 tablet (0.4 mg total) under the tongue every 5 (five) minutes as needed for chest pain.  .Marland Kitchen  potassium chloride SA (K-DUR,KLOR-CON) 20 MEQ tablet Take 1 tablet (20 mEq total) by mouth daily.     Allergies:   Aspirin and Other   Past Medical History:  Diagnosis Date  . CAD (coronary artery disease)    a. NSTEMI s/p DES to pLAD and dRCA  . Diabetes mellitus   . GERD (gastroesophageal reflux disease)   . HLD (hyperlipidemia)   . Hypertension   . Ischemic cardiomyopathy    a. 2D ECHO 05/02/15 with EF 25-30%, mild LV dilation. Mild LVH, mod HKof the mid-apicalanteroseptal myocardium, G1DD  . Tobacco abuse     Past Surgical History:  Procedure Laterality Date  . CARDIAC CATHETERIZATION N/A 05/03/2015   Procedure: Right/Left Heart  Cath and Coronary Angiography;  Surgeon: Peter M Swaziland, MD;  Location: [facility];  Service: Cardiovascular;  Laterality: N/A;  . CARDIAC CATHETERIZATION N/A 05/03/2015   Procedure: Intravascular Ultrasound/IVUS;  Surgeon: Peter M Swaziland, MD;  Location: [facility];  Service: Cardiovascular;  Laterality: N/A;  . CARDIAC CATHETERIZATION N/A 05/03/2015   Procedure: Coronary Stent Intervention;  Surgeon: Peter M Swaziland, MD;  Location: [facility];  Service: Cardiovascular;  Laterality: N/A;  rca  . PERCUTANEOUS CORONARY STENT INTERVENTION (PCI-S)  05/03/2015   Procedure: Percutaneous Coronary Stent Intervention (Pci-S);  Surgeon: Peter M Swaziland, MD;  Location: [facility] INVASIVE CV LAB CUPID;  Service: Cardiovascular;;  Prox LAD  . WISDOM TOOTH EXTRACTION     bottom LT, top RT     Social History:  The patient  reports that he quit smoking about 19 months ago. His smoking use included Cigarettes. He smoked 0.25 packs per day. He has never used smokeless tobacco. He reports that he does not drink alcohol or use drugs.   Family History:  The patient's family history includes Breast cancer in his mother; Diabetes in his brother and sister; Heart disease in his father; Hypertension in his brother and sister.    ROS:  Please see the history of present illness. All other systems are reviewed and  Negative to the above problem except as noted.    PHYSICAL EXAM: VS:  BP (!) 144/88   Pulse 78   Ht 6' (1.829 m)   Wt 202 lb 12.8 oz (92 kg)   SpO2 98%   BMI 27.50 kg/m   GEN: Well nourished, well developed, in no acute distress  HEENT: normal  Neck: no JVD, carotid bruits, or masses Cardiac: RRR; no murmurs, rubs, or gallops,no edema  Respiratory:  clear to auscultation bilaterally, normal work of breathing GI: soft, nontender, nondistended, + BS  No hepatomegaly  MS: no deformity Moving all extremities   Skin: warm and dry, no rash Neuro:  Strength and sensation are  intact Psych: euthymic mood, full affect   EKG:  EKG is not ordered today.   Lipid Panel    Component Value Date/Time   CHOL 238 (H) 09/10/2016 0307   TRIG 108 09/10/2016 0307   HDL 55 09/10/2016 0307   CHOLHDL 4.3 09/10/2016 0307   VLDL 22 09/10/2016 0307   LDLCALC 161 (H) 09/10/2016 0307      Wt Readings from Last 3 Encounters:  12/17/16 202 lb 12.8 oz (92 kg)  09/18/16 191 lb 12.8 oz (87 kg)  09/11/16 184 lb 3.2 oz (83.6 kg)      ASSESSMENT AND PLAN:  1  CAD  No symptom to sugg angina    2  Chr systolic CHF  LVef improved some on recent echo to 35 to 40%  Volume ok on exam  Would continue meds   Will check labs  3.  HL heck lipids     F/u in May / June    Current medicines are reviewed at length with the patient today.  The patient does not have concerns regarding medicines.  Signed, Dietrich Pates, MD  12/17/2016 11:01 AM    [facility] [address] [place], [place], [place]  [address] Phone: [phone]; Fax: [phone]

## 2016-12-17 NOTE — Patient Instructions (Signed)
Your physician recommends that you continue on your current medications as directed. Please refer to the Current Medication list given to you today. Your physician recommends that you return for lab work today (CBC, BMET, LIPIDS)  Your physician wants you to follow-up in: MAY/JUNE, 2018 WITH DR. Tenny Craw.  You will receive a reminder letter in the mail two months in advance. If you don't receive a letter, please call our office to schedule the follow-up appointment.

## 2016-12-19 ENCOUNTER — Telehealth: Payer: Self-pay | Admitting: Internal Medicine

## 2016-12-19 NOTE — Telephone Encounter (Signed)
Per DPR on file left voice mail for patient informing CBC, electrolytes and kidney function are ok but LDL is high and we wanted to see if he is taking Lipitor 80 mg every day. Asked that he call back tomorrow to let us know if he is taking.

## 2016-12-19 NOTE — Telephone Encounter (Signed)
New message    Pt verbalized that he is calling to get his lab results

## 2017-01-02 NOTE — Telephone Encounter (Signed)
Notes Recorded by Elliot Cousin, RMA on 12/26/2016 at 9:50 AM EST [email or web address] jw 12/26/16

## 2017-01-04 ENCOUNTER — Encounter: Payer: Self-pay | Admitting: *Deleted

## 2017-01-04 NOTE — Telephone Encounter (Signed)
Left message to call back. Sent letter to call office regarding lab results.

## 2017-01-11 ENCOUNTER — Emergency Department (HOSPITAL_COMMUNITY): Payer: Medicaid Other

## 2017-01-11 ENCOUNTER — Emergency Department (HOSPITAL_COMMUNITY)
Admission: EM | Admit: 2017-01-11 | Discharge: 2017-01-12 | Disposition: A | Discharge status: Home / Self Care | Payer: Medicaid Other | Attending: Emergency Medicine | Admitting: Emergency Medicine

## 2017-01-11 ENCOUNTER — Encounter (HOSPITAL_COMMUNITY): Payer: Self-pay | Admitting: Emergency Medicine

## 2017-01-11 DIAGNOSIS — I110 Hypertensive heart disease with heart failure: Secondary | ICD-10-CM | POA: Diagnosis not present

## 2017-01-11 DIAGNOSIS — I2510 Atherosclerotic heart disease of native coronary artery without angina pectoris: Secondary | ICD-10-CM | POA: Insufficient documentation

## 2017-01-11 DIAGNOSIS — F17210 Nicotine dependence, cigarettes, uncomplicated: Secondary | ICD-10-CM | POA: Insufficient documentation

## 2017-01-11 DIAGNOSIS — R112 Nausea with vomiting, unspecified: Secondary | ICD-10-CM | POA: Diagnosis not present

## 2017-01-11 DIAGNOSIS — R1084 Generalized abdominal pain: Secondary | ICD-10-CM | POA: Diagnosis not present

## 2017-01-11 DIAGNOSIS — E1159 Type 2 diabetes mellitus with other circulatory complications: Secondary | ICD-10-CM | POA: Diagnosis not present

## 2017-01-11 DIAGNOSIS — R197 Diarrhea, unspecified: Secondary | ICD-10-CM

## 2017-01-11 DIAGNOSIS — Z7984 Long term (current) use of oral hypoglycemic drugs: Secondary | ICD-10-CM | POA: Insufficient documentation

## 2017-01-11 DIAGNOSIS — I252 Old myocardial infarction: Secondary | ICD-10-CM | POA: Diagnosis not present

## 2017-01-11 DIAGNOSIS — Z955 Presence of coronary angioplasty implant and graft: Secondary | ICD-10-CM | POA: Diagnosis not present

## 2017-01-11 DIAGNOSIS — R109 Unspecified abdominal pain: Secondary | ICD-10-CM

## 2017-01-11 DIAGNOSIS — I5041 Acute combined systolic (congestive) and diastolic (congestive) heart failure: Secondary | ICD-10-CM | POA: Insufficient documentation

## 2017-01-11 LAB — BASIC METABOLIC PANEL
Anion gap: 10 (ref 5–15)
BUN: 13 mg/dL (ref 6–20)
CO2: 24 mmol/L (ref 22–32)
Calcium: 9.7 mg/dL (ref 8.9–10.3)
Chloride: 103 mmol/L (ref 101–111)
Creatinine, Ser: 1.32 mg/dL — ABNORMAL HIGH (ref 0.61–1.24)
GFR calc Af Amer: >60 mL/min (ref >60)
GFR calc non Af Amer: >60 mL/min (ref >60)
Glucose, Bld: 209 mg/dL — ABNORMAL HIGH (ref 65–99)
Potassium: 3.9 mmol/L (ref 3.5–5.1)
Sodium: 137 mmol/L (ref 135–145)

## 2017-01-11 LAB — URINALYSIS, ROUTINE W REFLEX MICROSCOPIC
Bacteria, UA: NONE SEEN
Bilirubin Urine: NEGATIVE
Glucose, UA: 50 mg/dL — AB
Ketones, ur: 5 mg/dL — AB
Leukocytes, UA: NEGATIVE
Nitrite: NEGATIVE
Protein, ur: 100 mg/dL — AB
Specific Gravity, Urine: 1.021 (ref 1.005–1.030)
Squamous Epithelial / LPF: NONE SEEN
pH: 5 (ref 5.0–8.0)

## 2017-01-11 LAB — CBC
HCT: 42.4 % (ref 39.0–52.0)
Hemoglobin: 15.1 g/dL (ref 13.0–17.0)
MCH: 32.4 pg (ref 26.0–34.0)
MCHC: 35.6 g/dL (ref 30.0–36.0)
MCV: 91 fL (ref 78.0–100.0)
Platelets: 236 10*3/uL (ref 150–400)
RBC: 4.66 MIL/uL (ref 4.22–5.81)
RDW: 12.8 % (ref 11.5–15.5)
WBC: 8.6 10*3/uL (ref 4.0–10.5)

## 2017-01-11 LAB — I-STAT TROPONIN, ED: Troponin i, poc: 0.01 ng/mL (ref 0.00–0.08)

## 2017-01-11 LAB — LIPASE, BLOOD: Lipase: 23 U/L (ref 11–51)

## 2017-01-11 MED ORDER — ONDANSETRON 4 MG PO TBDP
ORAL_TABLET | ORAL | Status: AC
  Filled 2017-01-11: qty 1

## 2017-01-11 MED ORDER — ONDANSETRON HCL 4 MG/2ML IJ SOLN
4.0000 mg | Freq: Once | INTRAMUSCULAR | Status: AC
  Administered 2017-01-11: 4 mg via INTRAVENOUS
  Filled 2017-01-11: qty 2

## 2017-01-11 MED ORDER — HYDROMORPHONE HCL 2 MG/ML IJ SOLN
1.0000 mg | Freq: Once | INTRAMUSCULAR | Status: AC
  Administered 2017-01-11: 1 mg via INTRAVENOUS
  Filled 2017-01-11: qty 1

## 2017-01-11 MED ORDER — ONDANSETRON 4 MG PO TBDP
4.0000 mg | ORAL_TABLET | Freq: Once | ORAL | Status: AC
  Administered 2017-01-11: 4 mg via ORAL

## 2017-01-11 MED ORDER — DICYCLOMINE HCL 10 MG/ML IM SOLN
20.0000 mg | Freq: Once | INTRAMUSCULAR | Status: AC
  Administered 2017-01-11: 20 mg via INTRAMUSCULAR
  Filled 2017-01-11: qty 2

## 2017-01-11 MED ORDER — OXYCODONE-ACETAMINOPHEN 5-325 MG PO TABS: 1.0000 | ORAL_TABLET | Freq: Once | ORAL | Status: DC

## 2017-01-11 MED ORDER — HYDROMORPHONE HCL 2 MG/ML IJ SOLN
1.0000 mg | Freq: Once | INTRAMUSCULAR | Status: AC
  Administered 2017-01-12: 1 mg via INTRAVENOUS
  Filled 2017-01-11: qty 1

## 2017-01-11 MED ORDER — IOPAMIDOL (ISOVUE-300) INJECTION 61%
INTRAVENOUS | Status: AC
Start: 2017-01-11 — End: 2017-01-12
  Administered 2017-01-12: 100 mL
  Filled 2017-01-11: qty 100

## 2017-01-11 MED ORDER — SODIUM CHLORIDE 0.9 % IV BOLUS (SEPSIS)
1000.0000 mL | Freq: Once | INTRAVENOUS | Status: AC
  Administered 2017-01-12: 1000 mL via INTRAVENOUS

## 2017-01-11 MED ORDER — SODIUM CHLORIDE 0.9 % IV BOLUS (SEPSIS)
1000.0000 mL | Freq: Once | INTRAVENOUS | Status: AC
  Administered 2017-01-11: 1000 mL via INTRAVENOUS

## 2017-01-11 MED ORDER — ONDANSETRON 4 MG PO TBDP
4.0000 mg | ORAL_TABLET | Freq: Once | ORAL | Status: AC | PRN
  Administered 2017-01-11: 4 mg via ORAL

## 2017-01-11 MED ORDER — PROMETHAZINE HCL 25 MG/ML IJ SOLN
12.5000 mg | Freq: Once | INTRAMUSCULAR | Status: AC
  Administered 2017-01-12: 12.5 mg via INTRAVENOUS
  Filled 2017-01-11: qty 1

## 2017-01-11 NOTE — ED Triage Notes (Signed)
Pt presents to ED for left sided chest pain and left sided abdominal pain.  Pt c/o diarrhea multiple times today, as well as nausea, diaphoresis, and shortness of breath.  Pt sts pain radiates to his back some, denies any further radiation.  Pt visibly uncomfortable in triage.

## 2017-01-11 NOTE — ED Provider Notes (Signed)
[facility]PT Provider Note   CSN: [id number] Arrival date & time: 01/11/17  1642 By signing my name below, I, Bridgette Habermann, attest that this documentation has been prepared under the direction and in the presence of Raeford Razor, MD. Electronically Signed: Bridgette Habermann, ED Scribe. 01/11/17. 8:11 PM.  History   Chief Complaint Chief Complaint  Patient presents with  . Chest Pain  . Abdominal Pain   The history is provided by the patient. No language interpreter was used.   HPI Comments: Alan Diaz is a 52 y.o. male with h/o CAD, DM, GERD, HLD, and HTN, who presents to the Emergency Department complaining of left-sided abdominal pain radiating to his back onset earlier today. Pt also has associated diarrhea, nausea, diaphoresis, and shortness of breath. He notes he also had left-sided chest pain which he states seems to have resolved at this time. He has not tried any OTC medications PTA. No known sick contacts with similar symptoms. Pt has a stent in place. Denies h/o abdominal surgeries. Pt further denies fever, chills, or any other associated symptoms.   Past Medical History:  Diagnosis Date  . CAD (coronary artery disease)    a. NSTEMI s/p DES to pLAD and dRCA  . Diabetes mellitus   . GERD (gastroesophageal reflux disease)   . HLD (hyperlipidemia)   . Hypertension   . Ischemic cardiomyopathy    a. 2D ECHO 05/02/15 with EF 25-30%, mild LV dilation. Mild LVH, mod HKof the mid-apicalanteroseptal myocardium, G1DD  . Tobacco abuse     Patient Active Problem List   Diagnosis Date Noted  . Cardiomyopathy, ischemic 09/09/2016  . Non compliance w medication regimen 09/09/2016  . Elevated troponin 09/09/2016  . NSTEMI (non-ST elevated myocardial infarction) (HCC) 05/05/2015  . GERD (gastroesophageal reflux disease)   . HLD (hyperlipidemia)   . CAD- S/P PCI May 2016   . Acute combined systolic and diastolic heart failure, NYHA class 4 (HCC) 05/03/2015  . Essential hypertension  05/03/2015  . TOBACCO ABUSE 01/19/2009  . Type 2 diabetes mellitus with circulatory disorder (HCC) 10/31/2008    Past Surgical History:  Procedure Laterality Date  . CARDIAC CATHETERIZATION N/A 05/03/2015   Procedure: Right/Left Heart Cath and Coronary Angiography;  Surgeon: Peter M Swaziland, MD;  [facility];  Service: Cardiovascular;  Laterality: N/A;  . CARDIAC CATHETERIZATION N/A 05/03/2015   Procedure: Intravascular Ultrasound/IVUS;  Surgeon: Peter M Swaziland, MD;  Location: [facility];  Service: Cardiovascular;  Laterality: N/A;  . CARDIAC CATHETERIZATION N/A 05/03/2015   Procedure: Coronary Stent Intervention;  Surgeon: Peter M Swaziland, MD;  Location: [facility];  Service: Cardiovascular;  Laterality: N/A;  rca  . PERCUTANEOUS CORONARY STENT INTERVENTION (PCI-S)  05/03/2015   Procedure: Percutaneous Coronary Stent Intervention (Pci-S);  Surgeon: Peter M Swaziland, MD;  Location: [facility] INVASIVE CV LAB [facility]UP[facility];  Service: Cardiovascular;;  Prox LAD  . WISDOM TOOTH EXTRACTION     bottom LT, top RT       Home Medications    Prior to Admission medications   Medication Sig Start Date End Date Taking? Authorizing Provider  atorvastatin (LIPITOR) 80 MG tablet TAKE 1 TABLET BY MOUTH DAILY AT 6 PM 09/12/16   Pricilla Riffle, MD  carvedilol (COREG) 12.5 MG tablet Take 1 tablet (12.5 mg total) by mouth 2 (two) times daily with a meal. 09/11/16   Janetta Hora, PA-C  clopidogrel (PLAVIX) 75 MG tablet Take 1 tablet (75 mg total)  by mouth daily. 09/11/16   Janetta Hora, PA-C  furosemide (LASIX) 40 MG tablet Take 1 tablet (40 mg total) by mouth daily. 09/11/16   Janetta Hora, PA-C  lisinopril (PRINIVIL,ZESTRIL) 10 MG tablet TAKE 1 TABLET BY MOUTH DAILY 09/12/16   Pricilla Riffle, MD  metFORMIN (GLUCOPHAGE) 500 MG tablet Take 1 tablet (500 mg total) by mouth 2 (two) times daily with a meal. 09/18/16   Janetta Hora, PA-C  nitroGLYCERIN (NITROSTAT) 0.4 MG  SL tablet Place 1 tablet (0.4 mg total) under the tongue every 5 (five) minutes as needed for chest pain. 05/05/15   Janetta Hora, PA-C  potassium chloride SA (K-DUR,KLOR-CON) 20 MEQ tablet Take 1 tablet (20 mEq total) by mouth daily. 09/12/16   Janetta Hora, PA-C    Family History Family History  Problem Relation Age of Onset  . Breast cancer Mother   . Heart disease Father   . Diabetes Sister   . Hypertension Sister   . Hypertension Brother   . Diabetes Brother     Social History Social History  Substance Use Topics  . Smoking status: Current Some Day Smoker    Packs/day: 0.10    Types: Cigarettes    Last attempt to quit: 05/03/2015  . Smokeless tobacco: Never Used  . Alcohol use No     Comment: very occasionally     Allergies   Aspirin and Other   Review of Systems Review of Systems  Constitutional: Positive for diaphoresis. Negative for chills and fever.  Respiratory: Positive for shortness of breath.   Cardiovascular: Negative for chest pain.  Gastrointestinal: Positive for abdominal pain, diarrhea and nausea.  Musculoskeletal: Positive for back pain.  All other systems reviewed and are negative.    Physical Exam Updated Vital Signs BP 197/95 (BP Location: Right Arm)   Pulse 64   Temp 98.3 F (36.8 C) (Oral)   Resp 20   Ht 6' (1.829 m)   Wt 185 lb (83.9 kg)   SpO2 100%   BMI 25.09 kg/m   Physical Exam  Constitutional: He is oriented to person, place, and time. Vital signs are normal. He appears well-developed and well-nourished.  Non-toxic appearance. He does not appear ill. No distress.  Appears uncomfortable.  HENT:  Head: Normocephalic and atraumatic.  Nose: Nose normal.  Mouth/Throat: Oropharynx is clear and moist. No oropharyngeal exudate.  Eyes: Conjunctivae and EOM are normal. Pupils are equal, round, and reactive to light. No scleral icterus.  Neck: Normal range of motion. Neck supple. No tracheal deviation, no edema, no erythema and  normal range of motion present. No thyroid mass and no thyromegaly present.  Cardiovascular: Normal rate, regular rhythm, S1 normal, S2 normal, normal heart sounds, intact distal pulses and normal pulses.  Exam reveals no gallop and no friction rub.   No murmur heard. Pulmonary/Chest: Effort normal and breath sounds normal. No respiratory distress. He has no wheezes. He has no rhonchi. He has no rales.  Abdominal: Soft. Normal appearance and bowel sounds are normal. He exhibits no distension, no ascites and no mass. There is no hepatosplenomegaly. There is tenderness. There is no rebound, no guarding and no CVA tenderness.  Diffuse abdominal tenderness.  Musculoskeletal: Normal range of motion. He exhibits no edema or tenderness.  Lymphadenopathy:    He has no cervical adenopathy.  Neurological: He is alert and oriented to person, place, and time. He has normal strength. No cranial nerve deficit or sensory deficit.  Skin: Skin  is warm, dry and intact. No petechiae and no rash noted. He is not diaphoretic. No erythema. No pallor.  Nursing note and vitals reviewed.    ED Treatments / Results  DIAGNOSTIC STUDIES: Oxygen Saturation is 100% on RA, normal by my interpretation.    COORDINATION OF CARE: 8:08 PM Discussed treatment plan with pt at bedside and pt agreed to plan.  Labs (all labs ordered are listed, but only abnormal results are displayed) Labs Reviewed  BASIC METABOLIC PANEL - Abnormal; Notable for the following:       Result Value   Glucose, Bld 209 (*)    Creatinine, Ser 1.32 (*)    All other components within normal limits  URINALYSIS, ROUTINE W REFLEX MICROSCOPIC - Abnormal; Notable for the following:    Glucose, UA 50 (*)    Hgb urine dipstick MODERATE (*)    Ketones, ur 5 (*)    Protein, ur 100 (*)    All other components within normal limits  CBC  LIPASE, BLOOD  I-STAT TROPOININ, ED    EKG  EKG Interpretation  Date/Time:  Friday January 11 2017 18:30:09  EST Ventricular Rate:  64 PR Interval:  126 QRS Duration: 96 QT Interval:  404 QTC Calculation: 416 R Axis:   78 Text Interpretation:  Normal sinus rhythm Left ventricular hypertrophy with repolarization abnormality Abnormal ECG No significant change since last tracing Confirmed by Juleen China  MD, Jeannett Senior [phone]) on 01/11/2017 8:09:23 PM       Radiology Dg Chest 2 View  Result Date: 01/11/2017 CLINICAL DATA:  Reason for exam: chest pain (upper left lobe area) started at 11am; pt has been vomiting continuously since then; nausea; SOB; denies fever. Medical hx: diabetes, hypertension. EXAM: CHEST  2 VIEW COMPARISON:  09/09/2016 FINDINGS: Cardiac silhouette is normal in size. No mediastinal or hilar masses. No evidence of adenopathy. Clear lungs.  No pleural effusion.  No pneumothorax. Skeletal structures are intact. IMPRESSION: No active cardiopulmonary disease. Electronically Signed   By: Amie Portland M.D.   On: 01/11/2017 18:00    Procedures Procedures (including critical care time)  Medications Ordered in ED Medications  oxyCODONE-acetaminophen (PERCOCET/ROXICET) 5-325 MG per tablet 1 tablet (1 tablet Oral Not Given 01/12/17 0017)  ondansetron (ZOFRAN-ODT) 4 MG disintegrating tablet (not administered)  iopamidol (ISOVUE-300) 61 % injection (not administered)  ondansetron (ZOFRAN-ODT) disintegrating tablet 4 mg (4 mg Oral Given 01/11/17 1655)  ondansetron (ZOFRAN-ODT) disintegrating tablet 4 mg (4 mg Oral Given 01/11/17 1849)  sodium chloride 0.9 % bolus 1,000 mL (0 mLs Intravenous Stopped 01/11/17 2300)  ondansetron (ZOFRAN) injection 4 mg (4 mg Intravenous Given 01/11/17 2042)  HYDROmorphone (DILAUDID) injection 1 mg (1 mg Intravenous Given 01/11/17 2042)  dicyclomine (BENTYL) injection 20 mg (20 mg Intramuscular Given 01/11/17 2042)  promethazine (PHENERGAN) injection 12.5 mg (12.5 mg Intravenous Given 01/12/17 0019)  HYDROmorphone (DILAUDID) injection 1 mg (1 mg Intravenous Given 01/12/17  0018)  sodium chloride 0.9 % bolus 1,000 mL (1,000 mLs Intravenous New Bag/Given 01/12/17 0018)     Initial Impression / Assessment and Plan / ED Course  I have reviewed the triage vital signs and the nursing notes.  Pertinent labs & imaging results that were available during my care of the patient were reviewed by me and considered in my medical decision making (see chart for details).  Clinical Course     52 year old male with abdominal pain and nausea/vomiting/diarrhea. Symptom onset less than 24 hours ago. Suspect this might be a viral GI illness. He  was treated symptomatically with IV fluids, pain medication and antiemetics. Abdominal exam remains unchanged. Has mild to moderate diffuse tenderness. It does not localize. He has no peritonitis. He does not seem distended. He is afebrile. His labs are fairly unremarkable aside from very mild elevations in his creatinine and blood sugar in the 200s. He had some chest pain initially after the onset of symptoms but this has resolved and he has not had a recurrence of it since then. Symptoms seem very atypical for ACS although he has a history of CAD. EKG does not appear acutely changed. Troponin is normal.  Initially he was feeling better than had return of symptoms.and seems pretty uncomfortable. Will re-dose meds. Will CT. Disposition pending symptom control and CT results.   Final Clinical Impressions(s) / ED Diagnoses   Final diagnoses:  Abdominal pain, unspecified abdominal location  Nausea vomiting and diarrhea    New Prescriptions New Prescriptions   No medications on file   I personally preformed the services scribed in my presence. The recorded information has been reviewed is accurate. Raeford Razor, MD.     Raeford Razor, MD 01/12/17 [phone]

## 2017-01-11 NOTE — ED Notes (Signed)
Denies chest pain at this time.

## 2017-01-12 ENCOUNTER — Emergency Department (HOSPITAL_COMMUNITY): Payer: Medicaid Other

## 2017-01-12 LAB — I-STAT TROPONIN, ED: TROPONIN I, POC: 0.02 ng/mL (ref 0.00–0.08)

## 2017-01-12 MED ORDER — DICYCLOMINE HCL 20 MG PO TABS: 20.0000 mg | ORAL_TABLET | Freq: Three times a day (TID) | ORAL | 0 refills | Status: DC

## 2017-01-12 MED ORDER — LOPERAMIDE HCL 2 MG PO CAPS: 2.0000 mg | ORAL_CAPSULE | Freq: Four times a day (QID) | ORAL | 0 refills | Status: DC | PRN

## 2017-01-12 MED ORDER — ONDANSETRON 4 MG PO TBDP: 4.0000 mg | ORAL_TABLET | Freq: Three times a day (TID) | ORAL | 0 refills | Status: DC | PRN

## 2017-01-12 NOTE — ED Notes (Signed)
Patient transported to CT 

## 2017-01-12 NOTE — ED Provider Notes (Signed)
4:00 AM  Assumed care from Dr. Juleen China.  Pt is a 52 y.o. male with history of CAD who presents to the emergency department with abdominal pain, nausea vomiting and diarrhea. Patient has had unremarkable labs, 2 negative troponins, CT scan with no acute abnormality is of abdomen and pelvis. Suspect viral illness. He reports feeling much better and is drinking, eating without difficulty. Will discharge home with Zofran, Imodium and Bentyl. Discussed return precautions. Given outpatient PCP follow-up. He is comfortable with this plan.   At this time, I do not feel there is any life-threatening condition present. I have reviewed and discussed all results (EKG, imaging, lab, urine as appropriate) and exam findings with patient/family. I have reviewed nursing notes and appropriate previous records.  I feel the patient is safe to be discharged home without further emergent workup and can continue workup as an outpatient as needed. Discussed usual and customary return precautions. Patient/family verbalize understanding and are comfortable with this plan.  Outpatient follow-up has been provided. All questions have been answered.    Layla Maw Kedar Sedano, DO 01/12/17 [phone]

## 2017-01-12 NOTE — Discharge Instructions (Signed)
To find a primary care or specialty doctor please call [phone] or [phone] to access "[place] Find a Doctor Service." ° °You may also go on the [facility] website at [email or web address] ° °There are also multiple [facility], [facility], [facility] and [facility] practices throughout the [place] that are frequently accepting new patients. You may find a clinic that is close to your home and contact them. ° °[facility] -  °[address] °[place] [address] °[phone] ° ° °[facility] -  °[address] °[place] [address] °[phone] ° ° °[facility] - °[address]  °[place] [address] °[phone] ° ° °

## 2017-04-22 ENCOUNTER — Other Ambulatory Visit: Payer: Self-pay | Admitting: Physician Assistant

## 2017-04-22 ENCOUNTER — Inpatient Hospital Stay (HOSPITAL_COMMUNITY)
Admission: EM | Admit: 2017-04-22 | Discharge: 2017-05-01 | DRG: 233 | Disposition: A | Discharge status: Home / Self Care | Payer: Medicaid Other | Attending: Cardiothoracic Surgery | Admitting: Cardiothoracic Surgery

## 2017-04-22 ENCOUNTER — Encounter (HOSPITAL_COMMUNITY): Payer: Self-pay | Admitting: Emergency Medicine

## 2017-04-22 ENCOUNTER — Emergency Department (HOSPITAL_COMMUNITY): Payer: Medicaid Other

## 2017-04-22 DIAGNOSIS — D62 Acute posthemorrhagic anemia: Secondary | ICD-10-CM | POA: Diagnosis not present

## 2017-04-22 DIAGNOSIS — E1159 Type 2 diabetes mellitus with other circulatory complications: Secondary | ICD-10-CM | POA: Diagnosis present

## 2017-04-22 DIAGNOSIS — N183 Chronic kidney disease, stage 3 unspecified: Secondary | ICD-10-CM | POA: Diagnosis present

## 2017-04-22 DIAGNOSIS — I214 Non-ST elevation (NSTEMI) myocardial infarction: Principal | ICD-10-CM | POA: Diagnosis present

## 2017-04-22 DIAGNOSIS — I472 Ventricular tachycardia: Secondary | ICD-10-CM | POA: Diagnosis not present

## 2017-04-22 DIAGNOSIS — I5042 Chronic combined systolic (congestive) and diastolic (congestive) heart failure: Secondary | ICD-10-CM | POA: Diagnosis present

## 2017-04-22 DIAGNOSIS — I4892 Unspecified atrial flutter: Secondary | ICD-10-CM | POA: Diagnosis not present

## 2017-04-22 DIAGNOSIS — I240 Acute coronary thrombosis not resulting in myocardial infarction: Secondary | ICD-10-CM | POA: Diagnosis not present

## 2017-04-22 DIAGNOSIS — I5043 Acute on chronic combined systolic (congestive) and diastolic (congestive) heart failure: Secondary | ICD-10-CM | POA: Diagnosis present

## 2017-04-22 DIAGNOSIS — Z91018 Allergy to other foods: Secondary | ICD-10-CM | POA: Diagnosis not present

## 2017-04-22 DIAGNOSIS — K219 Gastro-esophageal reflux disease without esophagitis: Secondary | ICD-10-CM | POA: Diagnosis not present

## 2017-04-22 DIAGNOSIS — Z955 Presence of coronary angioplasty implant and graft: Secondary | ICD-10-CM | POA: Diagnosis not present

## 2017-04-22 DIAGNOSIS — Z951 Presence of aortocoronary bypass graft: Secondary | ICD-10-CM

## 2017-04-22 DIAGNOSIS — I5041 Acute combined systolic (congestive) and diastolic (congestive) heart failure: Secondary | ICD-10-CM | POA: Diagnosis not present

## 2017-04-22 DIAGNOSIS — I255 Ischemic cardiomyopathy: Secondary | ICD-10-CM | POA: Diagnosis present

## 2017-04-22 DIAGNOSIS — Z9114 Patient's other noncompliance with medication regimen: Secondary | ICD-10-CM

## 2017-04-22 DIAGNOSIS — Z886 Allergy status to analgesic agent status: Secondary | ICD-10-CM | POA: Diagnosis not present

## 2017-04-22 DIAGNOSIS — I252 Old myocardial infarction: Secondary | ICD-10-CM | POA: Diagnosis not present

## 2017-04-22 DIAGNOSIS — Z7902 Long term (current) use of antithrombotics/antiplatelets: Secondary | ICD-10-CM

## 2017-04-22 DIAGNOSIS — E785 Hyperlipidemia, unspecified: Secondary | ICD-10-CM | POA: Diagnosis not present

## 2017-04-22 DIAGNOSIS — Z720 Tobacco use: Secondary | ICD-10-CM

## 2017-04-22 DIAGNOSIS — E7800 Pure hypercholesterolemia, unspecified: Secondary | ICD-10-CM | POA: Diagnosis not present

## 2017-04-22 DIAGNOSIS — E1122 Type 2 diabetes mellitus with diabetic chronic kidney disease: Secondary | ICD-10-CM | POA: Diagnosis present

## 2017-04-22 DIAGNOSIS — I10 Essential (primary) hypertension: Secondary | ICD-10-CM | POA: Diagnosis not present

## 2017-04-22 DIAGNOSIS — I2510 Atherosclerotic heart disease of native coronary artery without angina pectoris: Secondary | ICD-10-CM | POA: Diagnosis not present

## 2017-04-22 DIAGNOSIS — I25110 Atherosclerotic heart disease of native coronary artery with unstable angina pectoris: Secondary | ICD-10-CM | POA: Diagnosis not present

## 2017-04-22 DIAGNOSIS — R570 Cardiogenic shock: Secondary | ICD-10-CM | POA: Diagnosis not present

## 2017-04-22 DIAGNOSIS — R0600 Dyspnea, unspecified: Secondary | ICD-10-CM | POA: Diagnosis present

## 2017-04-22 DIAGNOSIS — Z8249 Family history of ischemic heart disease and other diseases of the circulatory system: Secondary | ICD-10-CM

## 2017-04-22 DIAGNOSIS — Z833 Family history of diabetes mellitus: Secondary | ICD-10-CM | POA: Diagnosis not present

## 2017-04-22 DIAGNOSIS — Z803 Family history of malignant neoplasm of breast: Secondary | ICD-10-CM | POA: Diagnosis not present

## 2017-04-22 DIAGNOSIS — I130 Hypertensive heart and chronic kidney disease with heart failure and stage 1 through stage 4 chronic kidney disease, or unspecified chronic kidney disease: Secondary | ICD-10-CM | POA: Diagnosis present

## 2017-04-22 DIAGNOSIS — F17210 Nicotine dependence, cigarettes, uncomplicated: Secondary | ICD-10-CM | POA: Diagnosis present

## 2017-04-22 DIAGNOSIS — Z7984 Long term (current) use of oral hypoglycemic drugs: Secondary | ICD-10-CM

## 2017-04-22 DIAGNOSIS — Z09 Encounter for follow-up examination after completed treatment for conditions other than malignant neoplasm: Secondary | ICD-10-CM

## 2017-04-22 DIAGNOSIS — I420 Dilated cardiomyopathy: Secondary | ICD-10-CM | POA: Diagnosis not present

## 2017-04-22 DIAGNOSIS — Z9889 Other specified postprocedural states: Secondary | ICD-10-CM

## 2017-04-22 DIAGNOSIS — F17200 Nicotine dependence, unspecified, uncomplicated: Secondary | ICD-10-CM | POA: Diagnosis present

## 2017-04-22 DIAGNOSIS — E1022 Type 1 diabetes mellitus with diabetic chronic kidney disease: Secondary | ICD-10-CM

## 2017-04-22 LAB — I-STAT TROPONIN, ED: TROPONIN I, POC: 3.12 ng/mL — AB (ref 0.00–0.08)

## 2017-04-22 LAB — CBC
HCT: 40.2 % (ref 39.0–52.0)
Hemoglobin: 13.6 g/dL (ref 13.0–17.0)
MCH: 31.9 pg (ref 26.0–34.0)
MCHC: 33.8 g/dL (ref 30.0–36.0)
MCV: 94.4 fL (ref 78.0–100.0)
Platelets: 221 10*3/uL (ref 150–400)
RBC: 4.26 MIL/uL (ref 4.22–5.81)
RDW: 13.6 % (ref 11.5–15.5)
WBC: 10.7 10*3/uL — ABNORMAL HIGH (ref 4.0–10.5)

## 2017-04-22 LAB — BASIC METABOLIC PANEL
ANION GAP: 10 (ref 5–15)
BUN: 13 mg/dL (ref 6–20)
CALCIUM: 8.8 mg/dL — AB (ref 8.9–10.3)
CO2: 25 mmol/L (ref 22–32)
Chloride: 101 mmol/L (ref 101–111)
Creatinine, Ser: 1.42 mg/dL — ABNORMAL HIGH (ref 0.61–1.24)
GFR, EST NON AFRICAN AMERICAN: 55 mL/min — AB (ref >60)
Glucose, Bld: 314 mg/dL — ABNORMAL HIGH (ref 65–99)
Potassium: 4.4 mmol/L (ref 3.5–5.1)
SODIUM: 136 mmol/L (ref 135–145)

## 2017-04-22 LAB — GLUCOSE, CAPILLARY
Glucose-Capillary: 267 mg/dL — ABNORMAL HIGH (ref 65–99)
Glucose-Capillary: 328 mg/dL — ABNORMAL HIGH (ref 65–99)

## 2017-04-22 LAB — BRAIN NATRIURETIC PEPTIDE: B NATRIURETIC PEPTIDE 5: 960.3 pg/mL — AB (ref 0.0–100.0)

## 2017-04-22 LAB — TROPONIN I: TROPONIN I: 4.73 ng/mL — AB (ref <0.03)

## 2017-04-22 MED ORDER — ACETAMINOPHEN 325 MG PO TABS: 650.0000 mg | ORAL_TABLET | ORAL | Status: DC | PRN

## 2017-04-22 MED ORDER — FUROSEMIDE 10 MG/ML IJ SOLN
40.0000 mg | Freq: Two times a day (BID) | INTRAMUSCULAR | Status: DC
  Administered 2017-04-23 (×2): 40 mg via INTRAVENOUS
  Filled 2017-04-22 (×2): qty 4

## 2017-04-22 MED ORDER — CARVEDILOL 12.5 MG PO TABS
12.5000 mg | ORAL_TABLET | Freq: Two times a day (BID) | ORAL | Status: DC
  Administered 2017-04-22 – 2017-04-24 (×4): 12.5 mg via ORAL
  Filled 2017-04-22 (×4): qty 1

## 2017-04-22 MED ORDER — INSULIN ASPART 100 UNIT/ML ~~LOC~~ SOLN
0.0000 [IU] | Freq: Every day | SUBCUTANEOUS | Status: DC
  Administered 2017-04-22 – 2017-04-23 (×2): 4 [IU] via SUBCUTANEOUS

## 2017-04-22 MED ORDER — FUROSEMIDE 10 MG/ML IJ SOLN
60.0000 mg | Freq: Once | INTRAMUSCULAR | Status: AC
  Administered 2017-04-22: 60 mg via INTRAVENOUS
  Filled 2017-04-22: qty 6

## 2017-04-22 MED ORDER — ONDANSETRON HCL 4 MG/2ML IJ SOLN: 4.0000 mg | Freq: Four times a day (QID) | INTRAMUSCULAR | Status: DC | PRN

## 2017-04-22 MED ORDER — POTASSIUM CHLORIDE CRYS ER 20 MEQ PO TBCR
20.0000 meq | EXTENDED_RELEASE_TABLET | Freq: Every day | ORAL | Status: DC
  Administered 2017-04-22 – 2017-04-23 (×2): 20 meq via ORAL
  Filled 2017-04-22 (×2): qty 1

## 2017-04-22 MED ORDER — INSULIN ASPART 100 UNIT/ML ~~LOC~~ SOLN
0.0000 [IU] | Freq: Three times a day (TID) | SUBCUTANEOUS | Status: DC
  Administered 2017-04-22: 3 [IU] via SUBCUTANEOUS
  Administered 2017-04-23: 11 [IU] via SUBCUTANEOUS
  Administered 2017-04-23: 2 [IU] via SUBCUTANEOUS
  Administered 2017-04-23: 3 [IU] via SUBCUTANEOUS
  Administered 2017-04-24: 2 [IU] via SUBCUTANEOUS

## 2017-04-22 MED ORDER — ASPIRIN EC 81 MG PO TBEC
81.0000 mg | DELAYED_RELEASE_TABLET | Freq: Every day | ORAL | Status: DC
  Administered 2017-04-23 – 2017-04-24 (×2): 81 mg via ORAL
  Filled 2017-04-22 (×2): qty 1

## 2017-04-22 MED ORDER — HEPARIN BOLUS VIA INFUSION
4000.0000 [IU] | Freq: Once | INTRAVENOUS | Status: AC
  Administered 2017-04-22: 4000 [IU] via INTRAVENOUS
  Filled 2017-04-22: qty 4000

## 2017-04-22 MED ORDER — ATORVASTATIN CALCIUM 80 MG PO TABS
80.0000 mg | ORAL_TABLET | Freq: Every day | ORAL | Status: DC
  Administered 2017-04-22 – 2017-04-23 (×2): 80 mg via ORAL
  Filled 2017-04-22 (×2): qty 1

## 2017-04-22 MED ORDER — NITROGLYCERIN 0.4 MG SL SUBL: 0.4000 mg | SUBLINGUAL_TABLET | SUBLINGUAL | Status: DC | PRN

## 2017-04-22 MED ORDER — HEPARIN (PORCINE) IN NACL 100-0.45 UNIT/ML-% IJ SOLN
1550.0000 [IU]/h | INTRAMUSCULAR | Status: DC
  Administered 2017-04-22: 1100 [IU]/h via INTRAVENOUS
  Administered 2017-04-23: 1300 [IU]/h via INTRAVENOUS
  Administered 2017-04-24: 1550 [IU]/h via INTRAVENOUS
  Filled 2017-04-22 (×3): qty 250

## 2017-04-22 NOTE — ED Notes (Signed)
Attempted report 

## 2017-04-22 NOTE — ED Provider Notes (Signed)
[facility]-EMER[facility]ENCY DEPT Provider Note   CSN: [id number] Arrival date & time: 04/22/17  1126     History   Chief Complaint Chief Complaint  Patient presents with  . Shortness of Breath  . Chest Pain    HPI Alan Diaz is a 52 y.o. male with PMHx of CAD, NSTEMI, CHF, DM, GERD, HLD, HTN, 2 stents presenting today with complaints of shortness of breath since yesterday. He states his SOB has been intermittent and improving since yesterday. He reports it was worse last night and this morning. He reports associated chest soreness localized to left chest, constant, unchanged, 6/10. He first thought it was congestion from "cold like symptoms". 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 He states he's had something similar in the past and was given medications to go home with. He denies any radiation of pain. He reports numbness down his right arm that is chronic, however he denies new or worsening numbness or tingling to either arm or jaw.  He admits to smoking. He denies fever, chills, nausea, vomiting, diarrhea. He denies recent travel and hormone use.   Per EMR last echo 09/10/2016  LV EF% 35-40%, systolic function moderately reduced. Cardiac cath in 05/2015.   The history is provided by the patient. No language interpreter was used.    Past Medical History:  Diagnosis Date  . CAD (coronary artery disease)    a. NSTEMI s/p DES to pLAD and dRCA  . Diabetes mellitus   . GERD (gastroesophageal reflux disease)   . HLD (hyperlipidemia)   . Hypertension   . Ischemic cardiomyopathy    a. 2D ECHO 05/02/15 with EF 25-30%, mild LV dilation. Mild LVH, mod HKof the mid-apicalanteroseptal myocardium, G1DD  . Tobacco abuse     Patient Active Problem List   Diagnosis Date Noted  . Cardiomyopathy, ischemic 09/09/2016  . Non compliance w medication regimen 09/09/2016  . Elevated troponin 09/09/2016  . NSTEMI (non-ST elevated myocardial infarction) (HCC) 05/05/2015  . GERD (gastroesophageal  reflux disease)   . HLD (hyperlipidemia)   . CAD- S/P PCI May 2016   . Acute combined systolic and diastolic heart failure, NYHA class 4 (HCC) 05/03/2015  . Essential hypertension 05/03/2015  . TOBACCO ABUSE 01/19/2009  . Type 2 diabetes mellitus with circulatory disorder (HCC) 10/31/2008    Past Surgical History:  Procedure Laterality Date  . CARDIAC CATHETERIZATION N/A 05/03/2015   Procedure: Right/Left Heart Cath and Coronary Angiography;  Surgeon: Peter M Swaziland, MD;  Location: [facility];  Service: Cardiovascular;  Laterality: N/A;  . CARDIAC CATHETERIZATION N/A 05/03/2015   Procedure: Intravascular Ultrasound/IVUS;  Surgeon: Peter M Swaziland, MD;  Location: [facility];  Service: Cardiovascular;  Laterality: N/A;  . CARDIAC CATHETERIZATION N/A 05/03/2015   Procedure: Coronary Stent Intervention;  Surgeon: Peter M Swaziland, MD;  Location: [facility];  Service: Cardiovascular;  Laterality: N/A;  rca  . PERCUTANEOUS CORONARY STENT INTERVENTION (PCI-S)  05/03/2015   Procedure: Percutaneous Coronary Stent Intervention (Pci-S);  Surgeon: Peter M Swaziland, MD;  Location: [facility] INVASIVE CV LAB [facility];  Service: Cardiovascular;;  Prox LAD  . WISDOM TOOTH EXTRACTION     bottom LT, top RT       Home Medications    Prior to Admission medications   Medication Sig Start Date End Date Taking? Authorizing Provider  atorvastatin (LIPITOR) 80 MG tablet TAKE 1 TABLET BY MOUTH DAILY AT 6 PM 09/12/16  Yes Pricilla Riffle, MD  carvedilol (COREG) 12.5 MG tablet Take 1 tablet (12.5 mg total) by mouth 2 (two) times daily with a meal. 09/11/16  Yes Janetta Hora, PA-C  clopidogrel (PLAVIX) 75 MG tablet Take 1 tablet (75 mg total) by mouth daily. 09/11/16  Yes Janetta Hora, PA-C  furosemide (LASIX) 40 MG tablet Take 1 tablet (40 mg total) by mouth daily. 09/11/16  Yes Janetta Hora, PA-C  lisinopril (PRINIVIL,ZESTRIL) 10 MG tablet TAKE 1 TABLET BY MOUTH DAILY 09/12/16  Yes  Pricilla Riffle, MD  metFORMIN (GLUCOPHAGE) 500 MG tablet Take 1 tablet (500 mg total) by mouth 2 (two) times daily with a meal. 09/18/16  Yes Janetta Hora, PA-C  nitroGLYCERIN (NITROSTAT) 0.4 MG SL tablet Place 1 tablet (0.4 mg total) under the tongue every 5 (five) minutes as needed for chest pain. 05/05/15  Yes Janetta Hora, PA-C  Phenyleph-CPM-DM-APAP (ALKA-SELTZER PLUS COLD & FLU PO) Take 1 capsule by mouth as needed (for congestion).   Yes Historical Provider, MD  potassium chloride SA (K-DUR,KLOR-CON) 20 MEQ tablet Take 1 tablet (20 mEq total) by mouth daily. 09/12/16  Yes Janetta Hora, PA-C  dicyclomine (BENTYL) 20 MG tablet Take 1 tablet (20 mg total) by mouth 3 (three) times daily before meals. As needed for abdominal cramping Patient taking differently: Take 20 mg by mouth 3 (three) times daily as needed for spasms.  01/12/17   Kristen N Ward, DO  loperamide (IMODIUM) 2 MG capsule Take 1 capsule (2 mg total) by mouth 4 (four) times daily as needed for diarrhea or loose stools. 01/12/17   Kristen N Ward, DO  ondansetron (ZOFRAN ODT) 4 MG disintegrating tablet Take 1 tablet (4 mg total) by mouth every 8 (eight) hours as needed for nausea or vomiting. 01/12/17   Layla Maw Ward, DO    Family History Family History  Problem Relation Age of Onset  . Breast cancer Mother   . Heart disease Father   . Diabetes Sister   . Hypertension Sister   . Hypertension Brother   . Diabetes Brother     Social History Social History  Substance Use Topics  . Smoking status: Current Some Day Smoker    Packs/day: 0.10    Types: Cigarettes    Last attempt to quit: 05/03/2015  . Smokeless tobacco: Never Used  . Alcohol use No     Comment: very occasionally     Allergies   Aspirin and Other   Review of Systems Review of Systems  Constitutional: Negative for chills and fever.  HENT: Negative for congestion.   Respiratory: Positive for shortness of breath.   Cardiovascular: Positive for  chest pain. Negative for leg swelling.  Gastrointestinal: Negative for abdominal pain, diarrhea, nausea and vomiting.  Genitourinary: Negative for difficulty urinating and dysuria.  Skin: Negative for wound.     Physical Exam Updated Vital Signs BP 113/84   Pulse (!) 102   Temp 99.1 F (37.3 C) (Oral)   Resp (!) 25   SpO2 98%   Physical Exam  Constitutional: He appears well-developed and well-nourished.  Well appearing. On 2L of O2  HENT:  Head: Normocephalic and atraumatic.  Nose: Nose normal.  Mouth/Throat: Oropharynx is clear and moist.  Eyes: Conjunctivae and EOM are normal.  Neck: Normal range of motion.  Cardiovascular: Normal rate, normal heart sounds and intact distal pulses.   No murmur heard. Pulmonary/Chest: Effort normal.  Normal work of breathing. On 2 L of O2. No respiratory distress noted.  Mild crackles noted on exam to lower lung fields bilaterally.   Abdominal: Soft. There is no tenderness. There is no rebound and no guarding.  Musculoskeletal: Normal range of motion.  Chest soreness is not reproducible  Neurological: He is alert.  Skin: Skin is warm. Capillary refill takes less than 2 seconds.  No pitting edema noted.   Psychiatric: He has a normal mood and affect. His behavior is normal.  Nursing note and vitals reviewed.    ED Treatments / Results  Labs (all labs ordered are listed, but only abnormal results are displayed) Labs Reviewed  BASIC METABOLIC PANEL - Abnormal; Notable for the following:       Result Value   Glucose, Bld 314 (*)    Creatinine, Ser 1.42 (*)    Calcium 8.8 (*)    GFR calc non Af Amer 55 (*)    All other components within normal limits  CBC - Abnormal; Notable for the following:    WBC 10.7 (*)    All other components within normal limits  BRAIN NATRIURETIC PEPTIDE - Abnormal; Notable for the following:    B Natriuretic Peptide 960.3 (*)    All other components within normal limits  I-STAT TROPOININ, ED - Abnormal;  Notable for the following:    Troponin i, poc 3.12 (*)    All other components within normal limits    EKG  EKG Interpretation  Date/Time:  Monday April 22 2017 12:50:26 EDT Ventricular Rate:  102 PR Interval:    QRS Duration: 94 QT Interval:  326 QTC Calculation: 425 R Axis:   49 Text Interpretation:  Sinus tachycardia Left ventricular hypertrophy ST-t wave abnormality Abnormal ekg Confirmed by Gerhard Munch  MD [phone]) on 04/22/2017 1:47:30 PM       Radiology Dg Chest 2 View  Result Date: 04/22/2017 CLINICAL DATA:  Shortness of breath and chest pain.  Cough. EXAM: CHEST  2 VIEW COMPARISON:  January 11, 2017 FINDINGS: There is fine interstitial opacity in both perihilar regions. There are small pleural effusions bilaterally. There is evidence of atelectatic change in both lung bases with evidence of bullous disease in the left base. The heart size is within normal limits. Pulmonary vascularity is normal. No adenopathy evident. No bone lesions. IMPRESSION: Small pleural effusions bilaterally. Fine interstitial opacity in both perihilar regions. This appearance could be indicative of atypical congestive heart failure but also may be indicative of interstitial pneumonitis of a variety of etiologies, both infectious and noninfectious. There is no cardiomegaly. Pulmonary vascularity appears within normal limits. No evident adenopathy. Bullous disease noted left base. Electronically Signed   By: Bretta Bang III M.D.   On: 04/22/2017 12:15    Procedures Procedures (including critical care time)  Medications Ordered in ED Medications - No data to display   Initial Impression / Assessment and Plan / ED Course  I have reviewed the triage vital signs and the nursing notes.  Pertinent labs & imaging results that were available during my care of the patient were reviewed by me and considered in my medical decision making (see chart for details).    Concern for cardiac etiology of  Chest Pain. Possible NSTEMI vs CHF exacerbation. Cardiology has been consulted and will see patient in the ED for likely admit. Pt has been re-evaluated prior to consult and hemodynamically stable, NAD, heart RRR, pain 6/10, lungs with mild crackles. Some abnormal ischemic changes on EKG and first round of cardiac enzymes positive. BNP also elevated. Chest xray shows  evidence of small pleural effusions bilaterally. Possible atypical CHF vs interstitial pneumonitis. This case was discussed with Dr. Jeraldine Loots who agrees with plan to admit. Pt given lasix here in ED. Pt not given aspirin due to allergy.    Final Clinical Impressions(s) / ED Diagnoses   Final diagnoses:  NSTEMI (non-ST elevated myocardial infarction) [facility])    New Prescriptions New Prescriptions   No medications on file     [address] [place], [place] 04/22/17 1534    Gerhard Munch, MD 04/25/17 [phone]

## 2017-04-22 NOTE — H&P (Signed)
History & Physical    Patient ID: Alan Diaz MRN: [id number], DOB/AGE: 04/10/1965   Admit date: 04/22/2017   Primary Physician: Dietrich Pates, MD Primary Cardiologist: Dr. Tenny Craw   History of Present Illness    Alan Diaz is a 52 y.o. male with past medical history of CAD (s/p DES to pLAD and dRCA in 05/2015), ischemic cardiomyopathy, chronic combined systolic and diastolic CHF (EF previously 25-30%, improved to 35-40% by echo in 08/2016), HTN, HLD, and tobacco use who presents to [facility] ED on 04/22/2017 for evaluation of chest pain and worsening dyspnea.   In talking with the patient, he reports worsening dyspnea at rest and with exertion for the past 4-5 days. Reports symptoms are similar to what he experienced in 2016 when he required multiple stents. He denies any associated chest pain or palpitations. Has experienced numbness and tingling along his right upper extremities. Has also noticed worsening orthopnea, no lower extremity edema or abdominal distension.   Reports having not taken any of his cardiac medications in the past 4+ days including ASA, Lipitor, Coreg, Plavix, Lisinopril, and Lasix. Says he skips his medications for weeks at a time due to "not feeling like taking them". He is still smoking 4-5 cigarettes per day.    Initial labs show WBC of 10.7, Hgb 13.6, platelets 221. Na+ 136, K+ 4.4, creatinine 1.42 (baseline 1.2 - 1.3). Initial troponin 3.12. BNP 960. EKG shows sinus tachycardia, HR 102, with LVH and diffuse ST depression along the inferior and lateral leads (similar to prior tracings). CXR with small pleural effusions bilaterally and fine interstitial opacity in both perihilar regions.    Past Medical History    Past Medical History:  Diagnosis Date  . CAD (coronary artery disease) 2016   a. NSTEMI s/p DES to pLAD and dRCA  . Diabetes mellitus   . GERD (gastroesophageal reflux disease)   . HLD (hyperlipidemia)   . Hypertension   . Ischemic  cardiomyopathy    a. 2D ECHO 05/02/15 with EF 25-30%, mild LV dilation. Mild LVH, mod HKof the mid-apicalanteroseptal myocardium, G1DD  . Tobacco abuse     Past Surgical History:  Procedure Laterality Date  . CARDIAC CATHETERIZATION N/A 05/03/2015   Procedure: Right/Left Heart Cath and Coronary Angiography;  Surgeon: Peter M Swaziland, MD;  Location: [facility] INVASIVE CV LAB CUP[facility];  Service: Cardiovascular;  Laterality: N/A;  . CARDIAC CATHETERIZATION N/A 05/03/2015   Procedure: Intravascular Ultrasound/IVUS;  Surgeon: Peter M Swaziland, MD;  Location: [facility] INVASIVE CV LAB [facility]UP[facility];  Service: Cardiovascular;  Laterality: N/A;  . CARDIAC CATHETERIZATION N/A 05/03/2015   Procedure: Coronary Stent Intervention;  Surgeon: Peter M Swaziland, MD;  Location: [facility] INVASIVE CV LAB [facility]UP[facility];  Service: Cardiovascular;  Laterality: N/A;  rca  . PERCUTANEOUS CORONARY STENT INTERVENTION (PCI-S)  05/03/2015   Procedure: Percutaneous Coronary Stent Intervention (Pci-S);  Surgeon: Peter M Swaziland, MD;  Location: [facility] INVASIVE CV LAB CUPID;  Service: Cardiovascular;;  Prox LAD  . WISDOM TOOTH EXTRACTION     bottom LT, top RT     Allergies  Allergies  Allergen Reactions  . Aspirin Hives and Other (See Comments)    Makes pt feel funny  . Other Nausea And Vomiting and Other (See Comments)    Grape products     Home Medications    Prior to Admission medications   Medication Sig Start Date End Date Taking? Authorizing Provider  atorvastatin (LIPITOR) 80 MG tablet TAKE 1 TABLET BY MOUTH DAILY AT 6 PM 09/12/16  Yes Pricilla Riffle, MD  carvedilol (COREG) 12.5 MG tablet Take 1 tablet (12.5 mg total) by mouth 2 (two) times daily with a meal. 09/11/16  Yes Janetta Hora, PA-C  clopidogrel (PLAVIX) 75 MG tablet Take 1 tablet (75 mg total) by mouth daily. 09/11/16  Yes Janetta Hora, PA-C  furosemide (LASIX) 40 MG tablet Take 1 tablet (40 mg total) by mouth daily. 09/11/16  Yes Janetta Hora, PA-C  lisinopril (PRINIVIL,ZESTRIL) 10 MG  tablet TAKE 1 TABLET BY MOUTH DAILY 09/12/16  Yes Pricilla Riffle, MD  metFORMIN (GLUCOPHAGE) 500 MG tablet Take 1 tablet (500 mg total) by mouth 2 (two) times daily with a meal. 09/18/16  Yes Janetta Hora, PA-C  nitroGLYCERIN (NITROSTAT) 0.4 MG SL tablet Place 1 tablet (0.4 mg total) under the tongue every 5 (five) minutes as needed for chest pain. 05/05/15  Yes Janetta Hora, PA-C  Phenyleph-CPM-DM-APAP (ALKA-SELTZER PLUS COLD & FLU PO) Take 1 capsule by mouth as needed (for congestion).   Yes Historical Provider, MD  potassium chloride SA (K-DUR,KLOR-CON) 20 MEQ tablet Take 1 tablet (20 mEq total) by mouth daily. 09/12/16  Yes Janetta Hora, PA-C  dicyclomine (BENTYL) 20 MG tablet Take 1 tablet (20 mg total) by mouth 3 (three) times daily before meals. As needed for abdominal cramping Patient taking differently: Take 20 mg by mouth 3 (three) times daily as needed for spasms.  01/12/17   Kristen N Ward, DO  loperamide (IMODIUM) 2 MG capsule Take 1 capsule (2 mg total) by mouth 4 (four) times daily as needed for diarrhea or loose stools. 01/12/17   Kristen N Ward, DO  ondansetron (ZOFRAN ODT) 4 MG disintegrating tablet Take 1 tablet (4 mg total) by mouth every 8 (eight) hours as needed for nausea or vomiting. 01/12/17   Layla Maw Ward, DO    Family History    Family History  Problem Relation Age of Onset  . Breast cancer Mother   . Heart disease Father   . Diabetes Sister   . Hypertension Sister   . Hypertension Brother   . Diabetes Brother     Social History    Social History   Social History  . Marital status: Divorced    Spouse name: N/A  . Number of children: N/A  . Years of education: N/A   Occupational History  . Not on file.   Social History Main Topics  . Smoking status: Current Some Day Smoker    Packs/day: 0.10    Types: Cigarettes    Last attempt to quit: 05/03/2015  . Smokeless tobacco: Never Used  . Alcohol use No     Comment: very occasionally  . Drug  use: No     Comment: denies 06/28/14  . Sexual activity: Not on file   Other Topics Concern  . Not on file   Social History Narrative  . No narrative on file     Review of Systems    General:  No chills, fever, night sweats or weight changes.  Cardiovascular:  No chest pain, edema, palpitations, paroxysmal nocturnal dyspnea. Positive for orthopnea and dyspnea on exertion.  Dermatological: No rash, lesions/masses Respiratory: No cough, dyspnea Urologic: No hematuria, dysuria Abdominal:   No nausea, vomiting, diarrhea, bright red blood per rectum, melena, or hematemesis Neurologic:  No visual changes, wkns, changes in mental status. All other systems reviewed and are otherwise negative except as noted above.  Physical Exam    Blood pressure 114/83,  pulse 99, temperature 99.1 F (37.3 C), temperature source Oral, resp. rate (!) 28, SpO2 96 %.  General: Well developed, well nourished African American male appearing in no acute distress. Head: Normocephalic, atraumatic, sclera non-icteric, no xanthomas, nares are without discharge.  Neck: No carotid bruits. JVD not elevated.  Lungs: Respirations regular and unlabored, rales at bases bilaterally Heart: Regular rate and rhythm. No S3 or S4.  No murmur, no rubs, or gallops appreciated. Abdomen: Soft, non-tender, non-distended with normoactive bowel sounds. No hepatomegaly. No rebound/guarding. No obvious abdominal masses. Msk:  Strength and tone appear normal for age. No joint deformities or effusions. Extremities: No clubbing or cyanosis. No lower extremity edema.  Distal pedal pulses are 2+ bilaterally. Neuro: Alert and oriented X 3. Moves all extremities spontaneously. No focal deficits noted. Psych:  Responds to questions appropriately with a normal affect. Skin: No rashes or lesions noted  Labs    Troponin (Point of Care Test)  Recent Labs  04/22/17 1220  TROPIPOC 3.12*   No results for input(s): CKTOTAL, CKMB, TROPONINI in  the last 72 hours. Lab Results  Component Value Date   WBC 10.7 (H) 04/22/2017   HGB 13.6 04/22/2017   HCT 40.2 04/22/2017   MCV 94.4 04/22/2017   PLT 221 04/22/2017     Recent Labs Lab 04/22/17 1140  NA 136  K 4.4  CL 101  CO2 25  BUN 13  CREATININE 1.42*  CALCIUM 8.8*  GLUCOSE 314*   Lab Results  Component Value Date   CHOL 268 (H) 12/17/2016   HDL 50 12/17/2016   LDLCALC 192 (H) 12/17/2016   TRIG 130 12/17/2016   No results found for: DDIMER   B Natriuretic Peptide  Date/Time Value Ref Range Status  04/22/2017 12:55 PM 960.3 (H) 0.0 - 100.0 pg/mL Final  09/10/2016 03:19 PM 280.9 (H) 0.0 - 100.0 pg/mL Final  09/09/2016 04:27 AM 476.4 (H) 0.0 - 100.0 pg/mL Final   No results found for: PROBNP No results for input(s): INR in the last 72 hours.    Radiology Studies    Dg Chest 2 View  Result Date: 04/22/2017 CLINICAL DATA:  Shortness of breath and chest pain.  Cough. EXAM: CHEST  2 VIEW COMPARISON:  January 11, 2017 FINDINGS: There is fine interstitial opacity in both perihilar regions. There are small pleural effusions bilaterally. There is evidence of atelectatic change in both lung bases with evidence of bullous disease in the left base. The heart size is within normal limits. Pulmonary vascularity is normal. No adenopathy evident. No bone lesions. IMPRESSION: Small pleural effusions bilaterally. Fine interstitial opacity in both perihilar regions. This appearance could be indicative of atypical congestive heart failure but also may be indicative of interstitial pneumonitis of a variety of etiologies, both infectious and noninfectious. There is no cardiomegaly. Pulmonary vascularity appears within normal limits. No evident adenopathy. Bullous disease noted left base. Electronically Signed   By: Bretta Bang III M.D.   On: 04/22/2017 12:15    EKG & Cardiac Imaging    EKG: Sinus tachycardia, HR 102, with LVH and diffuse ST depression along the inferior and  lateral leads (similar to prior tracings)  - Personally Reviewed  ECHOCARDIOGRAM: 09/10/2016 Study Conclusions  - Left ventricle: The cavity size was moderately dilated. Wall   thickness was normal. Systolic function was moderately reduced.   The estimated ejection fraction was in the range of 35% to 40%.   Moderate diffuse hypokinesis with no identifiable regional   variations. Features  are consistent with a pseudonormal left   ventricular filling pattern, with concomitant abnormal relaxation   and increased filling pressure (grade 2 diastolic dysfunction). - Left atrium: The atrium was mildly dilated.  Impressions:  - Compared to 2016, LVEF has improved.   Cardiac Catheterization: 05/03/2015  Mid LAD lesion, 40% stenosed.  Prox RCA to Mid RCA lesion, 40% stenosed.  Normal right heart and LV filling pressures.  Ost LAD to Prox LAD lesion, 90% stenosed. There is a 0% residual stenosis post intervention.  A drug-eluting stent was placed.  Mid RCA to Dist RCA lesion, 95% stenosed. There is a 0% residual stenosis post intervention.  A drug-eluting stent was placed.   Recommendation. Continue Brilinta indefinitely since patient is ASA allergic. Anticipate DC in am. Aggressive risk factor modification.  Assessment & Plan   1. NSTEMI/ CAD - the patient has a known history of CAD, s/p DES to pLAD and dRCA in 05/2015. Presents for evaluation of worsening dyspnea at rest and with exertion for the past 4-5 days. Similar to prior symptoms in 2016. Denies any associated chest pain or palpitations.  - Initial troponin 3.12. EKG shows sinus tachycardia, HR 102, with LVH and diffuse ST depression along the inferior and lateral leads (similar to prior tracings).  - continue to cycle cardiac enzymes. Start Heparin and ASA. Will not restart Plavix at this time as he has not taken this in 5+ days.  - would plan for a cardiac catheterization once respiratory status improves.   2. Acute on  Chronic Combined Systolic and Diastolic CHF/ Ischemic Cardiomyopathy - EF previously 25-30%, improved to 35-40% by echo in 08/2016. - presents with worsening dyspnea on exertion and orthopnea in the setting of medication noncompliance. BNP 960. CXR with small pleural effusions bilaterally and fine interstitial opacity in both perihilar regions.  - obtain repeat echo to assess LV function and wall motion.  - start IV Lasix 40mg  BID. Restart Coreg. Plan to restart ACE-I/ARB once kidney function stabilizes.   3. HTN - BP at 114/83 on most recent check.  - continue PTA Coreg 12.5mg  BID. Plan to restart Lisinopril pending improvement in his renal function.   4. HLD - Lipid Panel in 11/2016 showed total cholesterol 268, HDL 50, and LDL 192. Goal LDL < 70. - restart Atorvastatin 80mg  daily. Will need repeat FLP and LFT's in 6-8 weeks.   5. Type 2 DM - hold Metformin in anticipation of cardiac catheterization. SSI while admitted.   6. Tobacco Use - cessation advised.   7. Stage 3 CKD - creatinine 1.42 on admission, continue to follow.    Signed, Ellsworth Lennox, PA-C 04/22/2017, 3:06 PM Pager: [phone] Patient seen and examined and history reviewed. Agree with above findings and plan. 52 yo BM with history of CAD, CHF, tobacco abuse, DM and noncompliance, presents with 2 day history of increased dyspnea, orthopnea, PND. Denies chest pain or increased edema. Hasn't taken his medication in several days. \ On exam he is a middle aged BM in NAD No JVD Lungs with bilateral rales. CV RRR without gallop or murmur Extremities without edema.  Labs are significant for BNP 960, troponin 3.12, creatinine 1.4. Ecg shows LVH with chronic repolarization abnormality. CXR shows perihilar edema.  Impression: acute on chronic combined systolic and diastolic CHF. Possibly exacerbated by ischemia. Noncompliant with medication.  2. NSTEMI ? ACS versus secondary to acute CHF. 3. CKD stage 3 4. HTN  with hypertensive heart disease 5. DM type 2 6 Tobacco  abuse  Plan: admit telemetry. IV diuresis, monitor weight and I/O. Monitor renal function closely. Smoking cessation advised. Stressed importance of compliance with medication. Will repeat Echo. May need to consider coronary angiogram depending on response to medical therapy and stabilization of renal function.  Peter Swaziland, MDFACC 04/22/2017 3:09 PM

## 2017-04-22 NOTE — Progress Notes (Signed)
Notified [place] PA trop 4.73, cont current tx. Emelda Brothers RN

## 2017-04-22 NOTE — ED Triage Notes (Signed)
Pt reports sob and left sided chest pain since yesterday, hx of chf, states he has been out of a few of his heart meds for the past couple of days, a/ox4, resp labored during triage.

## 2017-04-22 NOTE — Progress Notes (Signed)
ANTICOAGULATION CONSULT NOTE - Initial Consult  Pharmacy Consult for heparin  Indication: chest pain/ACS  Allergies  Allergen Reactions  . Aspirin Hives and Other (See Comments)    Makes pt feel funny  . Other Nausea And Vomiting and Other (See Comments)    Grape products    Patient Measurements: TBW 84 kg Heparin Dosing Weight: 84 kg  Assessment: 53 yo M presents on 4/23 with CP and worsening dyspnea. History of CAD s/p DES to pLAD and dRCA in 2016. Pharmacy consulted to start heparin for NSTEMI. CBC stable.   Goal of Therapy:  Heparin level 0.3-0.7 units/ml Monitor platelets by anticoagulation protocol: Yes   Plan:  Give heparin 4,000 unit bolus Start heparin gtt at 1,100 units/hr Monitor daily heparin level, CBC, s/s of bleed  Enzo Bi, PharmD, [facility] Clinical Pharmacist Pager [phone] 04/22/2017 5:04 PM

## 2017-04-23 ENCOUNTER — Other Ambulatory Visit (HOSPITAL_COMMUNITY): Payer: Medicaid Other

## 2017-04-23 DIAGNOSIS — N183 Chronic kidney disease, stage 3 unspecified: Secondary | ICD-10-CM

## 2017-04-23 HISTORY — DX: Chronic kidney disease, stage 3 unspecified: N18.30

## 2017-04-23 LAB — GLUCOSE, CAPILLARY
GLUCOSE-CAPILLARY: 318 mg/dL — AB (ref 65–99)
Glucose-Capillary: 160 mg/dL — ABNORMAL HIGH (ref 65–99)
Glucose-Capillary: 145 mg/dL — ABNORMAL HIGH (ref 65–99)
Glucose-Capillary: 336 mg/dL — ABNORMAL HIGH (ref 65–99)

## 2017-04-23 LAB — CBC
HEMATOCRIT: 40.6 % (ref 39.0–52.0)
HEMOGLOBIN: 13.7 g/dL (ref 13.0–17.0)
MCH: 31.8 pg (ref 26.0–34.0)
MCHC: 33.7 g/dL (ref 30.0–36.0)
MCV: 94.2 fL (ref 78.0–100.0)
Platelets: 211 10*3/uL (ref 150–400)
RBC: 4.31 MIL/uL (ref 4.22–5.81)
RDW: 13.6 % (ref 11.5–15.5)
WBC: 9.7 10*3/uL (ref 4.0–10.5)

## 2017-04-23 LAB — BASIC METABOLIC PANEL
ANION GAP: 10 (ref 5–15)
BUN: 10 mg/dL (ref 6–20)
CHLORIDE: 97 mmol/L — AB (ref 101–111)
CO2: 29 mmol/L (ref 22–32)
Calcium: 8.8 mg/dL — ABNORMAL LOW (ref 8.9–10.3)
Creatinine, Ser: 1.38 mg/dL — ABNORMAL HIGH (ref 0.61–1.24)
GFR calc Af Amer: >60 mL/min (ref >60)
GFR, EST NON AFRICAN AMERICAN: 57 mL/min — AB (ref >60)
GLUCOSE: 161 mg/dL — AB (ref 65–99)
Potassium: 3.7 mmol/L (ref 3.5–5.1)
Sodium: 136 mmol/L (ref 135–145)

## 2017-04-23 LAB — TROPONIN I
TROPONIN I: 4.04 ng/mL — AB (ref <0.03)
Troponin I: 3.86 ng/mL (ref <0.03)

## 2017-04-23 LAB — HEPARIN LEVEL (UNFRACTIONATED)
HEPARIN UNFRACTIONATED: 0.5 [IU]/mL (ref 0.30–0.70)
Heparin Unfractionated: 0.19 IU/mL — ABNORMAL LOW (ref 0.30–0.70)
Heparin Unfractionated: 0.17 IU/mL — ABNORMAL LOW (ref 0.30–0.70)

## 2017-04-23 LAB — HIV ANTIBODY (ROUTINE TESTING W REFLEX): HIV Screen 4th Generation wRfx: NONREACTIVE

## 2017-04-23 MED ORDER — SODIUM CHLORIDE 0.9 % IV SOLN: 250.0000 mL | INTRAVENOUS | Status: DC | PRN

## 2017-04-23 MED ORDER — SODIUM CHLORIDE 0.9% FLUSH
3.0000 mL | Freq: Two times a day (BID) | INTRAVENOUS | Status: DC
  Administered 2017-04-24: 3 mL via INTRAVENOUS

## 2017-04-23 MED ORDER — ISOSORBIDE MONONITRATE ER 30 MG PO TB24
15.0000 mg | ORAL_TABLET | Freq: Every day | ORAL | Status: DC
  Administered 2017-04-23 – 2017-04-24 (×2): 15 mg via ORAL
  Filled 2017-04-23 (×2): qty 1

## 2017-04-23 MED ORDER — GUAIFENESIN-DM 100-10 MG/5ML PO SYRP
5.0000 mL | ORAL_SOLUTION | ORAL | Status: DC | PRN
  Administered 2017-04-23 (×3): 5 mL via ORAL
  Filled 2017-04-23 (×3): qty 5

## 2017-04-23 MED ORDER — SODIUM CHLORIDE 0.9 % IV SOLN
INTRAVENOUS | Status: DC
  Administered 2017-04-24: 06:00:00 via INTRAVENOUS
  Administered 2017-04-24: 250 mL via INTRAVENOUS
  Administered 2017-04-24: 11:00:00 via INTRAVENOUS

## 2017-04-23 MED ORDER — HEPARIN BOLUS VIA INFUSION
2500.0000 [IU] | Freq: Once | INTRAVENOUS | Status: AC
  Administered 2017-04-23: 2500 [IU] via INTRAVENOUS
  Filled 2017-04-23: qty 2500

## 2017-04-23 MED ORDER — SODIUM CHLORIDE 0.9% FLUSH: 3.0000 mL | INTRAVENOUS | Status: DC | PRN

## 2017-04-23 NOTE — Progress Notes (Signed)
ANTICOAGULATION CONSULT NOTE - Follow Up Consult  Pharmacy Consult for Heparin  Indication: chest pain/ACS  Allergies  Allergen Reactions  . Aspirin Hives and Other (See Comments)    Makes pt feel funny  . Other Nausea And Vomiting and Other (See Comments)    Grape products   Patient Measurements: Height: 6' (182.9 cm) Weight: 185 lb 11.2 oz (84.2 kg) IBW/kg (Calculated) : 77.6  Vital Signs: Temp: 99.8 F (37.7 C) (04/23 2348) Temp Source: Oral (04/23 2348) BP: 102/72 (04/23 2028) Pulse Rate: 95 (04/23 1615)  Labs:  Recent Labs  04/22/17 1140 04/22/17 1700 04/22/17 2328  HGB 13.6  --   --   HCT 40.2  --   --   PLT 221  --   --   HEPARINUNFRC  --   --  0.19*  CREATININE 1.42*  --   --   TROPONINI  --  4.73*  --     Estimated Creatinine Clearance: 66.8 mL/min (A) (by C-G formula based on SCr of 1.42 mg/dL (H)).   Assessment: 52 y/o M on heparin for chest pain, initial heparin level is low at 0.19, no issues per RN.   Goal of Therapy:  Heparin level 0.3-0.7 units/ml Monitor platelets by anticoagulation protocol: Yes   Plan:  -Inc heparin to 1300 units/hr -1000 HL -F/U cardiology work-up  Abran Duke 04/23/2017,12:36 AM

## 2017-04-23 NOTE — Progress Notes (Signed)
ANTICOAGULATION CONSULT NOTE - Follow Up Consult  Pharmacy Consult for Heparin  Indication: chest pain/ACS  Allergies  Allergen Reactions  . Aspirin Hives and Other (See Comments)    Makes pt feel funny  . Other Nausea And Vomiting and Other (See Comments)    Grape products   Patient Measurements: Height: 6' (182.9 cm) Weight: 186 lb 11.2 oz (84.7 kg) IBW/kg (Calculated) : 77.6  Assessment: 52 y/o M on heparin for chest pain. Cath planned once more stable. Last heparin level therapeutic at 0.5. CBC stable  Goal of Therapy:  Heparin level 0.3-0.7 units/ml Monitor platelets by anticoagulation protocol: Yes   Plan:  Continue heparin drip at 1,550 units/hr Monitor daily heparin level, CBC, s/s of bleed  Enzo Bi, PharmD, [facility] Clinical Pharmacist Pager [phone] 04/23/2017 5:06 PM

## 2017-04-23 NOTE — Progress Notes (Signed)
Inpatient Diabetes Program Recommendations  AACE/ADA: New Consensus Statement on Inpatient Glycemic Control (2015)  Target Ranges:  Prepandial:   less than 140 mg/dL      Peak postprandial:   less than 180 mg/dL (1-2 hours)      Critically ill patients:  140 - 180 mg/dL   Results for Alan Diaz, Alan Diaz (MRN [id number]) as of 04/23/2017 13:27  Ref. Range 04/22/2017 17:09 04/22/2017 21:41 04/23/2017 08:09 04/23/2017 11:50  Glucose-Capillary Latest Ref Range: 65 - 99 mg/dL 753 (H) 005 (H) 110 (H) 318 (H)    Admit CP/ Dyspnea.   History: DM, CHF  Home DM Meds: Metformin 500 mg BID  Current Orders: Novolog Moderate Correction Scale/ SSI (0-15 units) TID AC + HS      MD- Please consider the following in-hospital insulin adjustments:  1. Start Novolog Meal Coverage: Novolog 4 units TID with meals (hold if pt eats <50% of meal)  2. Check current Hemoglobin A1c      --Will follow patient during hospitalization--  Ambrose Finland RN, MSN, CDE Diabetes Coordinator Inpatient Glycemic Control Team Team Pager: [phone] (8a-5p)

## 2017-04-23 NOTE — Progress Notes (Signed)
ANTICOAGULATION CONSULT NOTE - Follow Up Consult  Pharmacy Consult for Heparin  Indication: chest pain/ACS  Allergies  Allergen Reactions  . Aspirin Hives and Other (See Comments)    Makes pt feel funny  . Other Nausea And Vomiting and Other (See Comments)    Grape products   Patient Measurements: Height: 6' (182.9 cm) Weight: 186 lb 11.2 oz (84.7 kg) IBW/kg (Calculated) : 77.6  Vital Signs: Temp: 98.9 F (37.2 C) (04/24 0555) Temp Source: Oral (04/24 0555) BP: 114/74 (04/24 0850) Pulse Rate: 95 (04/24 0850)  Labs:  Recent Labs  04/22/17 1140 04/22/17 1700 04/22/17 2328 04/23/17 0423 04/23/17 0921  HGB 13.6  --   --  13.7  --   HCT 40.2  --   --  40.6  --   PLT 221  --   --  211  --   HEPARINUNFRC  --   --  0.19*  --  0.17*  CREATININE 1.42*  --   --  1.38*  --   TROPONINI  --  4.73* 3.86* 4.04*  --     Estimated Creatinine Clearance: 68.7 mL/min (A) (by C-G formula based on SCr of 1.38 mg/dL (H)).   Assessment: 52 y/o M on heparin for chest pain Cath planned Heparin level still low at 0.17 CBC stable  Goal of Therapy:  Heparin level 0.3-0.7 units/ml Monitor platelets by anticoagulation protocol: Yes   Plan:  Heparin 2500 units iv bolus x 1 Heparin drip to 1550 units / hr 6 hour level  Thank you Okey Regal, PharmD [phone] 04/23/2017,10:24 AM

## 2017-04-23 NOTE — Progress Notes (Signed)
Progress Note  Patient Name: Alan Diaz Date of Encounter: 04/23/2017  Primary Cardiologist: Dietrich Pates MD  Subjective   Feels much better today. States SOB resolved. No edema. No chest pain.  Inpatient Medications    Scheduled Meds: . aspirin EC  81 mg Oral Daily  . atorvastatin  80 mg Oral q1800  . carvedilol  12.5 mg Oral BID WC  . furosemide  40 mg Intravenous BID  . insulin aspart  0-15 Units Subcutaneous TID WC  . insulin aspart  0-5 Units Subcutaneous QHS  . potassium chloride SA  20 mEq Oral Daily   Continuous Infusions: . heparin 1,300 Units/hr (04/23/17 0556)   PRN Meds: acetaminophen, guaiFENesin-dextromethorphan, nitroGLYCERIN, ondansetron (ZOFRAN) IV   Vital Signs    Vitals:   04/23/17 0200 04/23/17 0547 04/23/17 0555 04/23/17 0850  BP: 106/79  117/73 114/74  Pulse:    95  Resp: (!) 26  (!) 21   Temp:   98.9 F (37.2 C)   TempSrc:   Oral   SpO2:   97%   Weight:  186 lb 11.2 oz (84.7 kg)    Height:        Intake/Output Summary (Last 24 hours) at 04/23/17 0950 Last data filed at 04/23/17 0700  Gross per 24 hour  Intake           4836.4 ml  Output             2825 ml  Net           2011.4 ml   Filed Weights   04/22/17 2005 04/23/17 0547  Weight: 185 lb 11.2 oz (84.2 kg) 186 lb 11.2 oz (84.7 kg)    Telemetry    NSR with PVCs. One triplet- Personally Reviewed  ECG     Pending today- Personally Reviewed  Physical Exam   GEN: WDBM No acute distress.   Neck: No JVD, no bruits Cardiac: RRR, no murmurs, rubs, or gallops. No edema. Pulses 2+ equal. Respiratory: Clear to auscultation bilaterally. GI: Soft, nontender, non-distended  MS: No edema; No deformity. Neuro:  Nonfocal  Psych: Normal affect   Labs    Chemistry Recent Labs Lab 04/22/17 1140 04/23/17 0423  NA 136 136  K 4.4 3.7  CL 101 97*  CO2 25 29  GLUCOSE 314* 161*  BUN 13 10  CREATININE 1.42* 1.38*  CALCIUM 8.8* 8.8*  GFRNONAA 55* 57*  GFRAA >60 >60  ANIONGAP  10 10     Hematology Recent Labs Lab 04/22/17 1140 04/23/17 0423  WBC 10.7* 9.7  RBC 4.26 4.31  HGB 13.6 13.7  HCT 40.2 40.6  MCV 94.4 94.2  MCH 31.9 31.8  MCHC 33.8 33.7  RDW 13.6 13.6  PLT 221 211    Cardiac Enzymes Recent Labs Lab 04/22/17 1700 04/22/17 2328 04/23/17 0423  TROPONINI 4.73* 3.86* 4.04*    Recent Labs Lab 04/22/17 1220  TROPIPOC 3.12*     BNP Recent Labs Lab 04/22/17 1255  BNP 960.3*     DDimer No results for input(s): DDIMER in the last 168 hours.   Radiology    Dg Chest 2 View  Result Date: 04/22/2017 CLINICAL DATA:  Shortness of breath and chest pain.  Cough. EXAM: CHEST  2 VIEW COMPARISON:  January 11, 2017 FINDINGS: There is fine interstitial opacity in both perihilar regions. There are small pleural effusions bilaterally. There is evidence of atelectatic change in both lung bases with evidence of bullous disease in the left base.  The heart size is within normal limits. Pulmonary vascularity is normal. No adenopathy evident. No bone lesions. IMPRESSION: Small pleural effusions bilaterally. Fine interstitial opacity in both perihilar regions. This appearance could be indicative of atypical congestive heart failure but also may be indicative of interstitial pneumonitis of a variety of etiologies, both infectious and noninfectious. There is no cardiomegaly. Pulmonary vascularity appears within normal limits. No evident adenopathy. Bullous disease noted left base. Electronically Signed   By: Bretta Bang III M.D.   On: 04/22/2017 12:15    Cardiac Studies   Echo pending.  Patient Profile     52 y.o. male with past medical history of CAD (s/p DES to pLAD and dRCA in 05/2015), ischemic cardiomyopathy, chronic combined systolic and diastolic CHF (EF previously 25-30%, improved to 35-40% by echo in 08/2016), HTN, HLD, and tobacco use who presents to [facility] ED on 04/22/2017 for evaluation of chest pain and worsening dyspnea.   Assessment &  Plan    1. Acute on chronic combined systolic/diastolic CHF. Last EF 35-40% by Echo in September 2017. Will update today. Clinically has responded to IV diuresis. I/O inaccurate (4.2 liter IV fluid intake is grossly inaccurate since only on IV heparin). Will continue diuresis today. ACEi on hold with CKD. Will add low dose nitrate. Sodium restriction 2. NSTEMI. Troponin up to 4.73. Ecg pending this am. This may be an ACS versus demand ischemia from CHF and CKD. If renal function stable will plan cardiac cath tomorrow to access coronary anatomy. On IV heparin for now. Daily weight.  3. HTN controlled. 4. DM type 2. Metformin on hold for cath. On SSI 5. HLD. On high dose statin. 6. Noncompliance. Patient has been noncompliant with medication. Stressed importance of taking meds. He reports motivation to follow recommendations. 7. Tobacco abuse counseled on cessation. 8. CKD creatinine improved 1.42>>1.38- monitor.   Signed, Martino Tompson [place], MD  04/23/2017, 9:50 AM

## 2017-04-24 ENCOUNTER — Inpatient Hospital Stay (HOSPITAL_COMMUNITY): Payer: Medicaid Other

## 2017-04-24 ENCOUNTER — Encounter (HOSPITAL_COMMUNITY)
Admission: EM | Disposition: A | Discharge status: Home / Self Care | Payer: Self-pay | Source: Home / Self Care | Attending: Cardiothoracic Surgery

## 2017-04-24 ENCOUNTER — Inpatient Hospital Stay (HOSPITAL_COMMUNITY): Payer: Medicaid Other | Admitting: Certified Registered"

## 2017-04-24 ENCOUNTER — Encounter (HOSPITAL_COMMUNITY): Payer: Self-pay | Admitting: Cardiology

## 2017-04-24 ENCOUNTER — Inpatient Hospital Stay (HOSPITAL_COMMUNITY)
Admission: EM | Disposition: A | Discharge status: Home / Self Care | Payer: Self-pay | Source: Home / Self Care | Attending: Cardiothoracic Surgery

## 2017-04-24 DIAGNOSIS — I2510 Atherosclerotic heart disease of native coronary artery without angina pectoris: Secondary | ICD-10-CM

## 2017-04-24 DIAGNOSIS — I25110 Atherosclerotic heart disease of native coronary artery with unstable angina pectoris: Secondary | ICD-10-CM

## 2017-04-24 DIAGNOSIS — I214 Non-ST elevation (NSTEMI) myocardial infarction: Secondary | ICD-10-CM

## 2017-04-24 DIAGNOSIS — Z951 Presence of aortocoronary bypass graft: Secondary | ICD-10-CM

## 2017-04-24 DIAGNOSIS — I255 Ischemic cardiomyopathy: Secondary | ICD-10-CM

## 2017-04-24 DIAGNOSIS — I240 Acute coronary thrombosis not resulting in myocardial infarction: Secondary | ICD-10-CM

## 2017-04-24 HISTORY — PX: TEE WITHOUT CARDIOVERSION: SHX5443

## 2017-04-24 HISTORY — PX: LEFT HEART CATH AND CORONARY ANGIOGRAPHY: CATH118249

## 2017-04-24 HISTORY — PX: CORONARY ARTERY BYPASS GRAFT: SHX141

## 2017-04-24 HISTORY — PX: IABP INSERTION: CATH118242

## 2017-04-24 LAB — POCT I-STAT 3, ART BLOOD GAS (G3+)
ACID-BASE EXCESS: 2 mmol/L (ref 0.0–2.0)
Acid-Base Excess: 1 mmol/L (ref 0.0–2.0)
Acid-base deficit: 1 mmol/L (ref 0.0–2.0)
BICARBONATE: 26.4 mmol/L (ref 20.0–28.0)
Bicarbonate: 27.3 mmol/L (ref 20.0–28.0)
Bicarbonate: 24.1 mmol/L (ref 20.0–28.0)
O2 SAT: 100 %
O2 SAT: 100 %
O2 Saturation: 96 %
PCO2 ART: 44.1 mmHg (ref 32.0–48.0)
PO2 ART: 289 mmHg — AB (ref 83.0–108.0)
PO2 ART: 422 mmHg — AB (ref 83.0–108.0)
Patient temperature: 35.1
TCO2: 28 mmol/L (ref 0–100)
TCO2: 29 mmol/L (ref 0–100)
TCO2: 25 mmol/L (ref 0–100)
pCO2 arterial: 43.5 mmHg (ref 32.0–48.0)
pCO2 arterial: 38.8 mmHg (ref 32.0–48.0)
pH, Arterial: 7.386 (ref 7.350–7.450)
pH, Arterial: 7.406 (ref 7.350–7.450)
pH, Arterial: 7.393 (ref 7.350–7.450)
pO2, Arterial: 78 mmHg — ABNORMAL LOW (ref 83.0–108.0)

## 2017-04-24 LAB — POCT I-STAT 4, (NA,K, GLUC, HGB,HCT)
Glucose, Bld: 180 mg/dL — ABNORMAL HIGH (ref 65–99)
HCT: 27 % — ABNORMAL LOW (ref 39.0–52.0)
Hemoglobin: 9.2 g/dL — ABNORMAL LOW (ref 13.0–17.0)
Potassium: 4.1 mmol/L (ref 3.5–5.1)
Sodium: 137 mmol/L (ref 135–145)

## 2017-04-24 LAB — POCT I-STAT, CHEM 8
BUN: 14 mg/dL (ref 6–20)
BUN: 14 mg/dL (ref 6–20)
BUN: 15 mg/dL (ref 6–20)
BUN: 14 mg/dL (ref 6–20)
BUN: 14 mg/dL (ref 6–20)
CALCIUM ION: 1.16 mmol/L (ref 1.15–1.40)
CALCIUM ION: 1.09 mmol/L — AB (ref 1.15–1.40)
CHLORIDE: 102 mmol/L (ref 101–111)
CHLORIDE: 100 mmol/L — AB (ref 101–111)
CHLORIDE: 102 mmol/L (ref 101–111)
CHLORIDE: 100 mmol/L — AB (ref 101–111)
CREATININE: 1.1 mg/dL (ref 0.61–1.24)
CREATININE: 0.9 mg/dL (ref 0.61–1.24)
Calcium, Ion: 1.1 mmol/L — ABNORMAL LOW (ref 1.15–1.40)
Calcium, Ion: 1.06 mmol/L — ABNORMAL LOW (ref 1.15–1.40)
Calcium, Ion: 1.31 mmol/L (ref 1.15–1.40)
Chloride: 100 mmol/L — ABNORMAL LOW (ref 101–111)
Creatinine, Ser: 1 mg/dL (ref 0.61–1.24)
Creatinine, Ser: 1 mg/dL (ref 0.61–1.24)
Creatinine, Ser: 1 mg/dL (ref 0.61–1.24)
Glucose, Bld: 194 mg/dL — ABNORMAL HIGH (ref 65–99)
Glucose, Bld: 204 mg/dL — ABNORMAL HIGH (ref 65–99)
Glucose, Bld: 208 mg/dL — ABNORMAL HIGH (ref 65–99)
Glucose, Bld: 210 mg/dL — ABNORMAL HIGH (ref 65–99)
Glucose, Bld: 208 mg/dL — ABNORMAL HIGH (ref 65–99)
HCT: 27 % — ABNORMAL LOW (ref 39.0–52.0)
HCT: 26 % — ABNORMAL LOW (ref 39.0–52.0)
HEMATOCRIT: 30 % — AB (ref 39.0–52.0)
HEMATOCRIT: 31 % — AB (ref 39.0–52.0)
HEMATOCRIT: 28 % — AB (ref 39.0–52.0)
HEMOGLOBIN: 10.2 g/dL — AB (ref 13.0–17.0)
Hemoglobin: 10.5 g/dL — ABNORMAL LOW (ref 13.0–17.0)
Hemoglobin: 9.2 g/dL — ABNORMAL LOW (ref 13.0–17.0)
Hemoglobin: 8.8 g/dL — ABNORMAL LOW (ref 13.0–17.0)
Hemoglobin: 9.5 g/dL — ABNORMAL LOW (ref 13.0–17.0)
POTASSIUM: 3.7 mmol/L (ref 3.5–5.1)
POTASSIUM: 3.6 mmol/L (ref 3.5–5.1)
POTASSIUM: 3.7 mmol/L (ref 3.5–5.1)
POTASSIUM: 4.3 mmol/L (ref 3.5–5.1)
Potassium: 4.6 mmol/L (ref 3.5–5.1)
SODIUM: 136 mmol/L (ref 135–145)
SODIUM: 137 mmol/L (ref 135–145)
SODIUM: 135 mmol/L (ref 135–145)
Sodium: 136 mmol/L (ref 135–145)
Sodium: 134 mmol/L — ABNORMAL LOW (ref 135–145)
TCO2: 28 mmol/L (ref 0–100)
TCO2: 28 mmol/L (ref 0–100)
TCO2: 28 mmol/L (ref 0–100)
TCO2: 26 mmol/L (ref 0–100)
TCO2: 27 mmol/L (ref 0–100)

## 2017-04-24 LAB — ECHOCARDIOGRAM COMPLETE
HEIGHTINCHES: 72 in
Weight: 2963.2 oz

## 2017-04-24 LAB — GLUCOSE, CAPILLARY
Glucose-Capillary: 142 mg/dL — ABNORMAL HIGH (ref 65–99)
Glucose-Capillary: 168 mg/dL — ABNORMAL HIGH (ref 65–99)
Glucose-Capillary: 126 mg/dL — ABNORMAL HIGH (ref 65–99)
Glucose-Capillary: 111 mg/dL — ABNORMAL HIGH (ref 65–99)
Glucose-Capillary: 108 mg/dL — ABNORMAL HIGH (ref 65–99)

## 2017-04-24 LAB — CBC
HCT: 37.2 % — ABNORMAL LOW (ref 39.0–52.0)
HCT: 28 % — ABNORMAL LOW (ref 39.0–52.0)
HEMOGLOBIN: 12.6 g/dL — AB (ref 13.0–17.0)
HEMOGLOBIN: 9.4 g/dL — AB (ref 13.0–17.0)
MCH: 31.4 pg (ref 26.0–34.0)
MCH: 31.2 pg (ref 26.0–34.0)
MCHC: 33.9 g/dL (ref 30.0–36.0)
MCHC: 33.6 g/dL (ref 30.0–36.0)
MCV: 92.8 fL (ref 78.0–100.0)
MCV: 93 fL (ref 78.0–100.0)
Platelets: 179 10*3/uL (ref 150–400)
Platelets: 136 10*3/uL — ABNORMAL LOW (ref 150–400)
RBC: 4.01 MIL/uL — ABNORMAL LOW (ref 4.22–5.81)
RBC: 3.01 MIL/uL — AB (ref 4.22–5.81)
RDW: 13.6 % (ref 11.5–15.5)
RDW: 13.2 % (ref 11.5–15.5)
WBC: 6.8 10*3/uL (ref 4.0–10.5)
WBC: 14.5 10*3/uL — ABNORMAL HIGH (ref 4.0–10.5)

## 2017-04-24 LAB — BASIC METABOLIC PANEL
Anion gap: 10 (ref 5–15)
BUN: 11 mg/dL (ref 6–20)
CHLORIDE: 100 mmol/L — AB (ref 101–111)
CO2: 26 mmol/L (ref 22–32)
CREATININE: 1.27 mg/dL — AB (ref 0.61–1.24)
Calcium: 8.6 mg/dL — ABNORMAL LOW (ref 8.9–10.3)
Glucose, Bld: 156 mg/dL — ABNORMAL HIGH (ref 65–99)
Potassium: 3.2 mmol/L — ABNORMAL LOW (ref 3.5–5.1)
SODIUM: 136 mmol/L (ref 135–145)

## 2017-04-24 LAB — PROTIME-INR
INR: 1.09
INR: 1.31
PROTHROMBIN TIME: 14.1 s (ref 11.4–15.2)
PROTHROMBIN TIME: 16.3 s — AB (ref 11.4–15.2)

## 2017-04-24 LAB — PREPARE RBC (CROSSMATCH)

## 2017-04-24 LAB — HEMOGLOBIN AND HEMATOCRIT, BLOOD
HCT: 26.3 % — ABNORMAL LOW (ref 39.0–52.0)
Hemoglobin: 9.2 g/dL — ABNORMAL LOW (ref 13.0–17.0)

## 2017-04-24 LAB — HEPARIN LEVEL (UNFRACTIONATED): HEPARIN UNFRACTIONATED: 0.33 [IU]/mL (ref 0.30–0.70)

## 2017-04-24 LAB — ABO/RH: ABO/RH(D): O POS

## 2017-04-24 LAB — PLATELET COUNT: Platelets: 133 10*3/uL — ABNORMAL LOW (ref 150–400)

## 2017-04-24 LAB — APTT: aPTT: 29 seconds (ref 24–36)

## 2017-04-24 SURGERY — CORONARY ARTERY BYPASS GRAFTING (CABG)
Anesthesia: General | Site: Chest

## 2017-04-24 SURGERY — LEFT HEART CATH AND CORONARY ANGIOGRAPHY
Anesthesia: LOCAL

## 2017-04-24 MED ORDER — SODIUM CHLORIDE 0.9% FLUSH: 3.0000 mL | INTRAVENOUS | Status: DC | PRN

## 2017-04-24 MED ORDER — ATROPINE SULFATE 1 MG/10ML IJ SOSY
PREFILLED_SYRINGE | INTRAMUSCULAR | Status: AC
  Filled 2017-04-24: qty 10

## 2017-04-24 MED ORDER — NITROGLYCERIN IN D5W 200-5 MCG/ML-% IV SOLN: 0.0000 ug/min | INTRAVENOUS | Status: DC

## 2017-04-24 MED ORDER — FENTANYL CITRATE (PF) 100 MCG/2ML IJ SOLN
INTRAMUSCULAR | Status: AC
  Filled 2017-04-24: qty 2

## 2017-04-24 MED ORDER — DEXMEDETOMIDINE HCL IN NACL 400 MCG/100ML IV SOLN
0.0000 ug/kg/h | INTRAVENOUS | Status: DC
  Administered 2017-04-24: 1 ug/kg/h via INTRAVENOUS
  Administered 2017-04-25 (×2): 1.2 ug/kg/h via INTRAVENOUS
  Filled 2017-04-24 (×4): qty 100

## 2017-04-24 MED ORDER — ACETAMINOPHEN 650 MG RE SUPP: 650.0000 mg | Freq: Once | RECTAL | Status: DC

## 2017-04-24 MED ORDER — MILRINONE LACTATE IN DEXTROSE 20-5 MG/100ML-% IV SOLN
0.1250 ug/kg/min | INTRAVENOUS | Status: AC
  Administered 2017-04-24: 0.25 ug/kg/min via INTRAVENOUS
  Filled 2017-04-24: qty 100

## 2017-04-24 MED ORDER — HEPARIN (PORCINE) IN NACL 2-0.9 UNIT/ML-% IJ SOLN
INTRAMUSCULAR | Status: DC | PRN
  Administered 2017-04-24: 11:00:00

## 2017-04-24 MED ORDER — ATROPINE SULFATE 1 MG/10ML IJ SOSY
PREFILLED_SYRINGE | INTRAMUSCULAR | Status: DC | PRN
  Administered 2017-04-24: 0.5 mg via INTRAVENOUS

## 2017-04-24 MED ORDER — SUCCINYLCHOLINE CHLORIDE 200 MG/10ML IV SOSY
PREFILLED_SYRINGE | INTRAVENOUS | Status: AC
  Filled 2017-04-24: qty 10

## 2017-04-24 MED ORDER — SODIUM CHLORIDE 0.9 % IV SOLN: 250.0000 mL | INTRAVENOUS | Status: DC

## 2017-04-24 MED ORDER — MIDAZOLAM HCL 5 MG/ML IJ SOLN
INTRAMUSCULAR | Status: DC | PRN
  Administered 2017-04-24: 3 mg via INTRAVENOUS
  Administered 2017-04-24: 5 mg via INTRAVENOUS
  Administered 2017-04-24: 2 mg via INTRAVENOUS

## 2017-04-24 MED ORDER — MAGNESIUM SULFATE 4 GM/100ML IV SOLN
4.0000 g | Freq: Once | INTRAVENOUS | Status: AC
  Administered 2017-04-24: 4 g via INTRAVENOUS
  Filled 2017-04-24: qty 100

## 2017-04-24 MED ORDER — HEMOSTATIC AGENTS (NO CHARGE) OPTIME
TOPICAL | Status: DC | PRN
  Administered 2017-04-24: 1 via TOPICAL

## 2017-04-24 MED ORDER — FENTANYL CITRATE (PF) 250 MCG/5ML IJ SOLN
INTRAMUSCULAR | Status: AC
  Filled 2017-04-24: qty 15

## 2017-04-24 MED ORDER — 0.9 % SODIUM CHLORIDE (POUR BTL) OPTIME
TOPICAL | Status: DC | PRN
  Administered 2017-04-24: 6000 mL

## 2017-04-24 MED ORDER — FENTANYL CITRATE (PF) 250 MCG/5ML IJ SOLN
INTRAMUSCULAR | Status: DC | PRN
  Administered 2017-04-24: 250 ug via INTRAVENOUS
  Administered 2017-04-24: 400 ug via INTRAVENOUS
  Administered 2017-04-24: 350 ug via INTRAVENOUS

## 2017-04-24 MED ORDER — MIDAZOLAM HCL 2 MG/2ML IJ SOLN
INTRAMUSCULAR | Status: DC | PRN
  Administered 2017-04-24: 1 mg via INTRAVENOUS

## 2017-04-24 MED ORDER — OXYCODONE HCL 5 MG PO TABS
5.0000 mg | ORAL_TABLET | ORAL | Status: DC | PRN
  Administered 2017-04-25 – 2017-05-01 (×21): 10 mg via ORAL
  Filled 2017-04-24 (×21): qty 2

## 2017-04-24 MED ORDER — FENTANYL CITRATE (PF) 250 MCG/5ML IJ SOLN
INTRAMUSCULAR | Status: AC
  Filled 2017-04-24: qty 5

## 2017-04-24 MED ORDER — HEPARIN SODIUM (PORCINE) 1000 UNIT/ML IJ SOLN
INTRAMUSCULAR | Status: DC | PRN
  Administered 2017-04-24 (×2): 4500 [IU] via INTRAVENOUS

## 2017-04-24 MED ORDER — ACETAMINOPHEN 160 MG/5ML PO SOLN
1000.0000 mg | Freq: Four times a day (QID) | ORAL | Status: DC
  Administered 2017-04-25 (×2): 1000 mg
  Filled 2017-04-24 (×2): qty 40.6

## 2017-04-24 MED ORDER — FENTANYL CITRATE (PF) 100 MCG/2ML IJ SOLN
INTRAMUSCULAR | Status: DC | PRN
  Administered 2017-04-24: 25 ug via INTRAVENOUS

## 2017-04-24 MED ORDER — SODIUM CHLORIDE 0.9 % IV SOLN
INTRAVENOUS | Status: DC
  Administered 2017-04-25: 2.9 [IU]/h via INTRAVENOUS
  Filled 2017-04-24: qty 2.5

## 2017-04-24 MED ORDER — ETOMIDATE 2 MG/ML IV SOLN
INTRAVENOUS | Status: DC | PRN
  Administered 2017-04-24: 8 mg via INTRAVENOUS

## 2017-04-24 MED ORDER — EPINEPHRINE PF 1 MG/ML IJ SOLN
0.5000 ug/min | INTRAVENOUS | Status: DC
  Filled 2017-04-24: qty 4

## 2017-04-24 MED ORDER — HEMOSTATIC AGENTS (NO CHARGE) OPTIME
TOPICAL | Status: DC | PRN
Start: 2017-04-24 — End: 2017-04-24
  Administered 2017-04-24: 1 via TOPICAL

## 2017-04-24 MED ORDER — PROTAMINE SULFATE 10 MG/ML IV SOLN
INTRAVENOUS | Status: AC
  Filled 2017-04-24: qty 5

## 2017-04-24 MED ORDER — TRANEXAMIC ACID (OHS) PUMP PRIME SOLUTION
2.0000 mg/kg | INTRAVENOUS | Status: DC
  Filled 2017-04-24: qty 1.68

## 2017-04-24 MED ORDER — IOPAMIDOL (ISOVUE-370) INJECTION 76%
INTRAVENOUS | Status: DC | PRN
  Administered 2017-04-24: 70 mL

## 2017-04-24 MED ORDER — ORAL CARE MOUTH RINSE
15.0000 mL | Freq: Four times a day (QID) | OROMUCOSAL | Status: DC
  Administered 2017-04-24 – 2017-04-28 (×10): 15 mL via OROMUCOSAL

## 2017-04-24 MED ORDER — NITROGLYCERIN IN D5W 200-5 MCG/ML-% IV SOLN
2.0000 ug/min | INTRAVENOUS | Status: AC
  Administered 2017-04-24: 10 ug/min via INTRAVENOUS
  Filled 2017-04-24: qty 250

## 2017-04-24 MED ORDER — PHENYLEPHRINE HCL 10 MG/ML IJ SOLN
30.0000 ug/min | INTRAMUSCULAR | Status: DC
  Filled 2017-04-24: qty 2

## 2017-04-24 MED ORDER — LIDOCAINE HCL (PF) 1 % IJ SOLN
INTRAMUSCULAR | Status: DC | PRN
  Administered 2017-04-24: 15 mL
  Administered 2017-04-24: 4 mL

## 2017-04-24 MED ORDER — DEXTROSE 5 % IV SOLN
1.5000 g | Freq: Two times a day (BID) | INTRAVENOUS | Status: AC
  Administered 2017-04-25 – 2017-04-26 (×4): 1.5 g via INTRAVENOUS
  Filled 2017-04-24 (×4): qty 1.5

## 2017-04-24 MED ORDER — TEMAZEPAM 15 MG PO CAPS: 15.0000 mg | ORAL_CAPSULE | Freq: Once | ORAL | Status: DC | PRN

## 2017-04-24 MED ORDER — MAGNESIUM SULFATE 50 % IJ SOLN
40.0000 meq | INTRAMUSCULAR | Status: DC
  Filled 2017-04-24: qty 10

## 2017-04-24 MED ORDER — SODIUM CHLORIDE 0.9 % IV SOLN
Freq: Once | INTRAVENOUS | Status: AC
  Administered 2017-04-24: 18:00:00 via INTRAVENOUS

## 2017-04-24 MED ORDER — ROCURONIUM BROMIDE 10 MG/ML (PF) SYRINGE
PREFILLED_SYRINGE | INTRAVENOUS | Status: AC
  Filled 2017-04-24: qty 10

## 2017-04-24 MED ORDER — LIDOCAINE IN D5W 4-5 MG/ML-% IV SOLN
INTRAVENOUS | Status: AC
  Filled 2017-04-24: qty 500

## 2017-04-24 MED ORDER — SODIUM CHLORIDE 0.45 % IV SOLN
INTRAVENOUS | Status: DC | PRN
  Administered 2017-04-26: 01:00:00 via INTRAVENOUS

## 2017-04-24 MED ORDER — BISACODYL 5 MG PO TBEC
5.0000 mg | DELAYED_RELEASE_TABLET | Freq: Once | ORAL | Status: DC
  Filled 2017-04-24: qty 1

## 2017-04-24 MED ORDER — MIDAZOLAM HCL 5 MG/5ML IJ SOLN
INTRAMUSCULAR | Status: DC | PRN
  Administered 2017-04-24: 2 mg via INTRAVENOUS

## 2017-04-24 MED ORDER — VECURONIUM BROMIDE 10 MG IV SOLR
INTRAVENOUS | Status: DC | PRN
  Administered 2017-04-24: 3 mg via INTRAVENOUS
  Administered 2017-04-24: 2 mg via INTRAVENOUS
  Administered 2017-04-24: 5 mg via INTRAVENOUS

## 2017-04-24 MED ORDER — LACTATED RINGERS IV SOLN
INTRAVENOUS | Status: DC | PRN
  Administered 2017-04-24 (×2): via INTRAVENOUS

## 2017-04-24 MED ORDER — ALBUMIN HUMAN 5 % IV SOLN
250.0000 mL | INTRAVENOUS | Status: AC | PRN
  Administered 2017-04-24 – 2017-04-25 (×3): 250 mL via INTRAVENOUS
  Filled 2017-04-24 (×2): qty 250

## 2017-04-24 MED ORDER — HEPARIN (PORCINE) IN NACL 2-0.9 UNIT/ML-% IJ SOLN
INTRAMUSCULAR | Status: AC
  Filled 2017-04-24: qty 1000

## 2017-04-24 MED ORDER — VANCOMYCIN HCL 10 G IV SOLR
1500.0000 mg | INTRAVENOUS | Status: AC
  Administered 2017-04-24: 1500 mg via INTRAVENOUS
  Filled 2017-04-24: qty 1500

## 2017-04-24 MED ORDER — SODIUM CHLORIDE 0.9 % IV SOLN
INTRAVENOUS | Status: AC
  Administered 2017-04-24: 2.7 [IU]/h via INTRAVENOUS
  Filled 2017-04-24: qty 2.5

## 2017-04-24 MED ORDER — ONDANSETRON HCL 4 MG/2ML IJ SOLN
4.0000 mg | Freq: Four times a day (QID) | INTRAMUSCULAR | Status: DC | PRN
  Administered 2017-04-25 – 2017-04-30 (×4): 4 mg via INTRAVENOUS
  Filled 2017-04-24 (×5): qty 2

## 2017-04-24 MED ORDER — TRANEXAMIC ACID 1000 MG/10ML IV SOLN
1.5000 mg/kg/h | INTRAVENOUS | Status: AC
  Administered 2017-04-24: 1.5 mg/kg/h via INTRAVENOUS
  Filled 2017-04-24: qty 25

## 2017-04-24 MED ORDER — LACTATED RINGERS IV SOLN
INTRAVENOUS | Status: DC | PRN
  Administered 2017-04-24: 13:00:00 via INTRAVENOUS

## 2017-04-24 MED ORDER — DEXMEDETOMIDINE HCL IN NACL 400 MCG/100ML IV SOLN
0.1000 ug/kg/h | INTRAVENOUS | Status: AC
  Administered 2017-04-24: .4 ug/kg/h via INTRAVENOUS
  Filled 2017-04-24: qty 100

## 2017-04-24 MED ORDER — BISACODYL 5 MG PO TBEC
10.0000 mg | DELAYED_RELEASE_TABLET | Freq: Every day | ORAL | Status: DC
  Administered 2017-04-26 – 2017-04-30 (×5): 10 mg via ORAL
  Filled 2017-04-24 (×4): qty 2

## 2017-04-24 MED ORDER — INSULIN REGULAR BOLUS VIA INFUSION
0.0000 [IU] | Freq: Three times a day (TID) | INTRAVENOUS | Status: DC
  Filled 2017-04-24: qty 10

## 2017-04-24 MED ORDER — ARTIFICIAL TEARS OPHTHALMIC OINT
TOPICAL_OINTMENT | OPHTHALMIC | Status: DC | PRN
  Administered 2017-04-24: 1 via OPHTHALMIC

## 2017-04-24 MED ORDER — CHLORHEXIDINE GLUCONATE 4 % EX LIQD
1.0000 "application " | Freq: Once | CUTANEOUS | Status: DC
  Filled 2017-04-24: qty 15

## 2017-04-24 MED ORDER — PHENYLEPHRINE HCL 10 MG/ML IJ SOLN
INTRAVENOUS | Status: DC | PRN
  Administered 2017-04-24: 50 ug/min via INTRAVENOUS

## 2017-04-24 MED ORDER — SODIUM CHLORIDE 0.9 % IV SOLN
20.0000 ug | INTRAVENOUS | Status: AC
  Administered 2017-04-24: 20 ug via INTRAVENOUS
  Filled 2017-04-24: qty 5

## 2017-04-24 MED ORDER — TRANEXAMIC ACID (OHS) BOLUS VIA INFUSION
15.0000 mg/kg | INTRAVENOUS | Status: AC
  Administered 2017-04-24: 1260 mg via INTRAVENOUS
  Filled 2017-04-24: qty 1260

## 2017-04-24 MED ORDER — HEPARIN SODIUM (PORCINE) 1000 UNIT/ML IJ SOLN
INTRAMUSCULAR | Status: AC
  Filled 2017-04-24: qty 1

## 2017-04-24 MED ORDER — LIDOCAINE HCL 1 % IJ SOLN
INTRAMUSCULAR | Status: AC
  Filled 2017-04-24: qty 20

## 2017-04-24 MED ORDER — HEPARIN SODIUM (PORCINE) 1000 UNIT/ML IJ SOLN
INTRAMUSCULAR | Status: DC | PRN
  Administered 2017-04-24: 3000 [IU] via INTRAVENOUS
  Administered 2017-04-24: 27000 [IU] via INTRAVENOUS

## 2017-04-24 MED ORDER — SODIUM CHLORIDE 0.9 % IJ SOLN
OROMUCOSAL | Status: DC | PRN
  Administered 2017-04-24: 13:00:00 via TOPICAL

## 2017-04-24 MED ORDER — POTASSIUM CHLORIDE 2 MEQ/ML IV SOLN
80.0000 meq | INTRAVENOUS | Status: DC
  Filled 2017-04-24: qty 40

## 2017-04-24 MED ORDER — MIDAZOLAM HCL 2 MG/2ML IJ SOLN
INTRAMUSCULAR | Status: AC
  Filled 2017-04-24: qty 2

## 2017-04-24 MED ORDER — PANTOPRAZOLE SODIUM 40 MG PO TBEC
40.0000 mg | DELAYED_RELEASE_TABLET | Freq: Every day | ORAL | Status: DC
  Administered 2017-04-26 – 2017-05-01 (×6): 40 mg via ORAL
  Filled 2017-04-24 (×6): qty 1

## 2017-04-24 MED ORDER — CHLORHEXIDINE GLUCONATE 0.12 % MT SOLN
15.0000 mL | Freq: Once | OROMUCOSAL | Status: DC
  Filled 2017-04-24: qty 15

## 2017-04-24 MED ORDER — ACETAMINOPHEN 160 MG/5ML PO SOLN: 650.0000 mg | Freq: Once | ORAL | Status: DC

## 2017-04-24 MED ORDER — SUCCINYLCHOLINE CHLORIDE 200 MG/10ML IV SOSY
PREFILLED_SYRINGE | INTRAVENOUS | Status: DC | PRN
  Administered 2017-04-24: 120 mg via INTRAVENOUS

## 2017-04-24 MED ORDER — CHLORHEXIDINE GLUCONATE 0.12 % MT SOLN
15.0000 mL | OROMUCOSAL | Status: AC
  Administered 2017-04-24: 15 mL via OROMUCOSAL

## 2017-04-24 MED ORDER — SODIUM CHLORIDE 0.9 % IV SOLN
INTRAVENOUS | Status: DC
  Administered 2017-04-28: 09:00:00 via INTRAVENOUS

## 2017-04-24 MED ORDER — LACTATED RINGERS IV SOLN
INTRAVENOUS | Status: DC
  Administered 2017-04-24: 18:00:00 via INTRAVENOUS

## 2017-04-24 MED ORDER — PHENYLEPHRINE HCL 10 MG/ML IJ SOLN
0.0000 ug/min | INTRAMUSCULAR | Status: DC
  Filled 2017-04-24: qty 2

## 2017-04-24 MED ORDER — IOPAMIDOL (ISOVUE-370) INJECTION 76%
INTRAVENOUS | Status: AC
  Filled 2017-04-24: qty 100

## 2017-04-24 MED ORDER — MIDAZOLAM HCL 2 MG/2ML IJ SOLN
2.0000 mg | INTRAMUSCULAR | Status: DC | PRN
  Administered 2017-04-24 – 2017-04-25 (×9): 2 mg via INTRAVENOUS
  Filled 2017-04-24 (×10): qty 2

## 2017-04-24 MED ORDER — VECURONIUM BROMIDE 10 MG IV SOLR: INTRAVENOUS | Status: DC | PRN

## 2017-04-24 MED ORDER — MILRINONE LACTATE IN DEXTROSE 20-5 MG/100ML-% IV SOLN
0.1250 ug/kg/min | INTRAVENOUS | Status: DC
  Administered 2017-04-25 – 2017-04-26 (×4): 0.25 ug/kg/min via INTRAVENOUS
  Administered 2017-04-27 – 2017-04-28 (×2): 0.125 ug/kg/min via INTRAVENOUS
  Filled 2017-04-24 (×6): qty 100

## 2017-04-24 MED ORDER — NOREPINEPHRINE BITARTRATE 1 MG/ML IV SOLN
0.0000 ug/min | INTRAVENOUS | Status: DC
  Filled 2017-04-24 (×2): qty 4

## 2017-04-24 MED ORDER — VERAPAMIL HCL 2.5 MG/ML IV SOLN
INTRAVENOUS | Status: DC | PRN
  Administered 2017-04-24: 10 mL via INTRA_ARTERIAL

## 2017-04-24 MED ORDER — NOREPINEPHRINE BITARTRATE 1 MG/ML IV SOLN
0.0000 ug/min | INTRAVENOUS | Status: AC
  Administered 2017-04-24: 3 ug/min via INTRAVENOUS
  Filled 2017-04-24: qty 4

## 2017-04-24 MED ORDER — METOPROLOL TARTRATE 5 MG/5ML IV SOLN: 2.5000 mg | INTRAVENOUS | Status: DC | PRN

## 2017-04-24 MED ORDER — LACTATED RINGERS IV SOLN: 500.0000 mL | Freq: Once | INTRAVENOUS | Status: DC | PRN

## 2017-04-24 MED ORDER — BISACODYL 10 MG RE SUPP: 10.0000 mg | Freq: Every day | RECTAL | Status: DC

## 2017-04-24 MED ORDER — ALBUMIN HUMAN 5 % IV SOLN
INTRAVENOUS | Status: DC | PRN
  Administered 2017-04-24 (×2): via INTRAVENOUS

## 2017-04-24 MED ORDER — EPINEPHRINE PF 1 MG/ML IJ SOLN
0.0000 ug/min | INTRAVENOUS | Status: DC
  Filled 2017-04-24 (×2): qty 4

## 2017-04-24 MED ORDER — VANCOMYCIN HCL IN DEXTROSE 1-5 GM/200ML-% IV SOLN
1000.0000 mg | Freq: Once | INTRAVENOUS | Status: AC
  Administered 2017-04-25: 1000 mg via INTRAVENOUS
  Filled 2017-04-24: qty 200

## 2017-04-24 MED ORDER — DOPAMINE-DEXTROSE 3.2-5 MG/ML-% IV SOLN
0.0000 ug/kg/min | INTRAVENOUS | Status: DC
  Filled 2017-04-24: qty 250

## 2017-04-24 MED ORDER — ESMOLOL HCL 100 MG/10ML IV SOLN
INTRAVENOUS | Status: AC
  Filled 2017-04-24: qty 10

## 2017-04-24 MED ORDER — EPHEDRINE SULFATE-NACL 50-0.9 MG/10ML-% IV SOSY
PREFILLED_SYRINGE | INTRAVENOUS | Status: DC | PRN
  Administered 2017-04-24: 15 mg via INTRAVENOUS

## 2017-04-24 MED ORDER — CEFUROXIME SODIUM 750 MG IJ SOLR
750.0000 mg | INTRAMUSCULAR | Status: DC
Start: 2017-04-24 — End: 2017-04-24
  Filled 2017-04-24: qty 750

## 2017-04-24 MED ORDER — METOPROLOL TARTRATE 25 MG/10 ML ORAL SUSPENSION: 12.5000 mg | Freq: Two times a day (BID) | ORAL | Status: DC

## 2017-04-24 MED ORDER — VECURONIUM BROMIDE 10 MG IV SOLR
INTRAVENOUS | Status: AC
  Filled 2017-04-24: qty 10

## 2017-04-24 MED ORDER — ROCURONIUM BROMIDE 10 MG/ML (PF) SYRINGE
PREFILLED_SYRINGE | INTRAVENOUS | Status: DC | PRN
  Administered 2017-04-24: 30 mg via INTRAVENOUS
  Administered 2017-04-24: 70 mg via INTRAVENOUS
  Administered 2017-04-24: 30 mg via INTRAVENOUS

## 2017-04-24 MED ORDER — SODIUM CHLORIDE 0.9 % IV SOLN
30.0000 meq | Freq: Once | INTRAVENOUS | Status: DC
  Filled 2017-04-24 (×2): qty 15

## 2017-04-24 MED ORDER — LACTATED RINGERS IV SOLN: INTRAVENOUS | Status: DC

## 2017-04-24 MED ORDER — PLASMA-LYTE 148 IV SOLN
INTRAVENOUS | Status: AC
  Administered 2017-04-24: 500 mL
  Filled 2017-04-24: qty 2.5

## 2017-04-24 MED ORDER — CHLORHEXIDINE GLUCONATE 0.12% ORAL RINSE (MEDLINE KIT)
15.0000 mL | Freq: Two times a day (BID) | OROMUCOSAL | Status: DC
  Administered 2017-04-24 – 2017-04-28 (×8): 15 mL via OROMUCOSAL

## 2017-04-24 MED ORDER — ACETAMINOPHEN 500 MG PO TABS
1000.0000 mg | ORAL_TABLET | Freq: Four times a day (QID) | ORAL | Status: DC
  Administered 2017-04-25 – 2017-04-28 (×11): 1000 mg via ORAL
  Filled 2017-04-24 (×11): qty 2

## 2017-04-24 MED ORDER — METOPROLOL TARTRATE 12.5 MG HALF TABLET
12.5000 mg | ORAL_TABLET | Freq: Once | ORAL | Status: DC
  Filled 2017-04-24: qty 1

## 2017-04-24 MED ORDER — PROTAMINE SULFATE 10 MG/ML IV SOLN
INTRAVENOUS | Status: DC | PRN
  Administered 2017-04-24 (×4): 50 mg via INTRAVENOUS
  Administered 2017-04-24: 25 mg via INTRAVENOUS
  Administered 2017-04-24: 75 mg via INTRAVENOUS

## 2017-04-24 MED ORDER — SODIUM CHLORIDE 0.9% FLUSH
3.0000 mL | Freq: Two times a day (BID) | INTRAVENOUS | Status: DC
  Administered 2017-04-25 – 2017-04-28 (×7): 3 mL via INTRAVENOUS

## 2017-04-24 MED ORDER — EPINEPHRINE PF 1 MG/ML IJ SOLN
0.0000 ug/min | INTRAVENOUS | Status: AC
  Administered 2017-04-24: 1.33 ug/min via INTRAVENOUS
  Filled 2017-04-24: qty 4

## 2017-04-24 MED ORDER — PHENYLEPHRINE 40 MCG/ML (10ML) SYRINGE FOR IV PUSH (FOR BLOOD PRESSURE SUPPORT)
PREFILLED_SYRINGE | INTRAVENOUS | Status: DC | PRN
  Administered 2017-04-24: 120 ug via INTRAVENOUS

## 2017-04-24 MED ORDER — GELATIN ABSORBABLE MT POWD
OROMUCOSAL | Status: DC | PRN
  Administered 2017-04-24: 13:00:00 via TOPICAL

## 2017-04-24 MED ORDER — DEXTROSE 5 % IV SOLN
1.5000 g | INTRAVENOUS | Status: AC
  Administered 2017-04-24: .75 g via INTRAVENOUS
  Administered 2017-04-24: 1.5 g via INTRAVENOUS
  Filled 2017-04-24: qty 1.5

## 2017-04-24 MED ORDER — METOPROLOL TARTRATE 12.5 MG HALF TABLET
12.5000 mg | ORAL_TABLET | Freq: Two times a day (BID) | ORAL | Status: DC
  Administered 2017-04-26 – 2017-04-27 (×4): 12.5 mg via ORAL
  Filled 2017-04-24 (×4): qty 1

## 2017-04-24 MED ORDER — LIDOCAINE 2% (20 MG/ML) 5 ML SYRINGE
INTRAMUSCULAR | Status: DC | PRN
  Administered 2017-04-24: 100 mg via INTRAVENOUS

## 2017-04-24 MED ORDER — DOCUSATE SODIUM 100 MG PO CAPS
200.0000 mg | ORAL_CAPSULE | Freq: Every day | ORAL | Status: DC
  Administered 2017-04-26 – 2017-04-30 (×5): 200 mg via ORAL
  Filled 2017-04-24 (×4): qty 2

## 2017-04-24 MED ORDER — MORPHINE SULFATE (PF) 4 MG/ML IV SOLN
2.0000 mg | INTRAVENOUS | Status: DC | PRN
  Administered 2017-04-24 – 2017-04-25 (×5): 4 mg via INTRAVENOUS
  Administered 2017-04-25: 2 mg via INTRAVENOUS
  Administered 2017-04-25 – 2017-04-26 (×13): 4 mg via INTRAVENOUS
  Filled 2017-04-24 (×22): qty 1

## 2017-04-24 MED ORDER — DEXMEDETOMIDINE HCL 200 MCG/2ML IV SOLN
0.0000 ug/kg/h | INTRAVENOUS | Status: DC
  Filled 2017-04-24: qty 2

## 2017-04-24 MED ORDER — ROCURONIUM BROMIDE 100 MG/10ML IV SOLN: INTRAVENOUS | Status: DC | PRN

## 2017-04-24 MED ORDER — SODIUM CHLORIDE 0.9 % IV SOLN
INTRAVENOUS | Status: DC
  Filled 2017-04-24: qty 30

## 2017-04-24 MED ORDER — MORPHINE SULFATE (PF) 2 MG/ML IV SOLN
1.0000 mg | INTRAVENOUS | Status: AC | PRN
  Administered 2017-04-24: 4 mg via INTRAVENOUS
  Filled 2017-04-24: qty 2

## 2017-04-24 MED ORDER — MIDAZOLAM HCL 10 MG/2ML IJ SOLN
INTRAMUSCULAR | Status: AC
  Filled 2017-04-24: qty 2

## 2017-04-24 MED ORDER — VERAPAMIL HCL 2.5 MG/ML IV SOLN
INTRAVENOUS | Status: AC
  Filled 2017-04-24: qty 2

## 2017-04-24 MED ORDER — POTASSIUM CHLORIDE CRYS ER 20 MEQ PO TBCR
40.0000 meq | EXTENDED_RELEASE_TABLET | Freq: Every day | ORAL | Status: DC
  Administered 2017-04-24: 40 meq via ORAL
  Filled 2017-04-24: qty 2

## 2017-04-24 MED ORDER — FAMOTIDINE IN NACL 20-0.9 MG/50ML-% IV SOLN
20.0000 mg | Freq: Two times a day (BID) | INTRAVENOUS | Status: AC
  Administered 2017-04-25 (×2): 20 mg via INTRAVENOUS
  Filled 2017-04-24 (×2): qty 50

## 2017-04-24 MED ORDER — TRAMADOL HCL 50 MG PO TABS
50.0000 mg | ORAL_TABLET | ORAL | Status: DC | PRN
  Administered 2017-04-30: 100 mg via ORAL
  Filled 2017-04-24: qty 2

## 2017-04-24 SURGICAL SUPPLY — 107 items
ADAPTER CARDIO PERF ANTE/RETRO (ADAPTER) ×4 IMPLANT
BAG DECANTER FOR FLEXI CONT (MISCELLANEOUS) ×4 IMPLANT
BANDAGE ACE 4X5 VEL STRL LF (GAUZE/BANDAGES/DRESSINGS) ×4 IMPLANT
BANDAGE ACE 6X5 VEL STRL LF (GAUZE/BANDAGES/DRESSINGS) ×4 IMPLANT
BASKET HEART  (ORDER IN 25'S) (MISCELLANEOUS) ×1
BASKET HEART (ORDER IN 25'S) (MISCELLANEOUS) ×1
BASKET HEART (ORDER IN 25S) (MISCELLANEOUS) ×2 IMPLANT
BLADE CLIPPER SURG (BLADE) IMPLANT
BLADE STERNUM SYSTEM 6 (BLADE) ×4 IMPLANT
BLADE SURG 11 STRL SS (BLADE) ×4 IMPLANT
BLADE SURG 12 STRL SS (BLADE) ×8 IMPLANT
BNDG GAUZE ELAST 4 BULKY (GAUZE/BANDAGES/DRESSINGS) ×4 IMPLANT
CANISTER SUCT 3000ML PPV (MISCELLANEOUS) ×4 IMPLANT
CANNULA GUNDRY RCSP 15FR (MISCELLANEOUS) ×4 IMPLANT
CATH CPB KIT VANTRIGT (MISCELLANEOUS) ×4 IMPLANT
CATH ROBINSON RED A/P 18FR (CATHETERS) ×16 IMPLANT
CATH THORACIC 28FR (CATHETERS) ×4 IMPLANT
CATH THORACIC 36FR RT ANG (CATHETERS) ×4 IMPLANT
CLIP TI WIDE RED SMALL 24 (CLIP) ×4 IMPLANT
COVER MAYO STAND STRL (DRAPES) ×4 IMPLANT
CRADLE DONUT ADULT HEAD (MISCELLANEOUS) ×4 IMPLANT
DRAIN CHANNEL 32F RND 10.7 FF (WOUND CARE) ×4 IMPLANT
DRAPE CARDIOVASCULAR INCISE (DRAPES) ×2
DRAPE SLUSH/WARMER DISC (DRAPES) ×4 IMPLANT
DRAPE SRG 135X102X78XABS (DRAPES) ×2 IMPLANT
DRSG AQUACEL AG ADV 3.5X14 (GAUZE/BANDAGES/DRESSINGS) ×4 IMPLANT
DRSG COVADERM 4X14 (GAUZE/BANDAGES/DRESSINGS) ×4 IMPLANT
ELECT BLADE 4.0 EZ CLEAN MEGAD (MISCELLANEOUS) ×4
ELECT BLADE 6.5 EXT (BLADE) ×4 IMPLANT
ELECT CAUTERY BLADE 6.4 (BLADE) ×4 IMPLANT
ELECT REM PT RETURN 9FT ADLT (ELECTROSURGICAL) ×8
ELECTRODE BLDE 4.0 EZ CLN MEGD (MISCELLANEOUS) ×2 IMPLANT
ELECTRODE REM PT RTRN 9FT ADLT (ELECTROSURGICAL) ×4 IMPLANT
FELT TEFLON 1X6 (MISCELLANEOUS) ×4 IMPLANT
GAUZE SPONGE 4X4 12PLY STRL (GAUZE/BANDAGES/DRESSINGS) ×8 IMPLANT
GLOVE BIO SURGEON STRL SZ7.5 (GLOVE) ×12 IMPLANT
GLOVE BIOGEL M 6.5 STRL (GLOVE) ×8 IMPLANT
GLOVE BIOGEL M STER SZ 6 (GLOVE) ×12 IMPLANT
GLOVE BIOGEL M STRL SZ7.5 (GLOVE) ×8 IMPLANT
GLOVE BIOGEL PI IND STRL 6 (GLOVE) ×2 IMPLANT
GLOVE BIOGEL PI IND STRL 6.5 (GLOVE) ×2 IMPLANT
GLOVE BIOGEL PI IND STRL 7.0 (GLOVE) ×4 IMPLANT
GLOVE BIOGEL PI IND STRL 7.5 (GLOVE) ×2 IMPLANT
GLOVE BIOGEL PI INDICATOR 6 (GLOVE) ×2
GLOVE BIOGEL PI INDICATOR 6.5 (GLOVE) ×2
GLOVE BIOGEL PI INDICATOR 7.0 (GLOVE) ×4
GLOVE BIOGEL PI INDICATOR 7.5 (GLOVE) ×2
GOWN STRL REUS W/ TWL LRG LVL3 (GOWN DISPOSABLE) ×20 IMPLANT
GOWN STRL REUS W/TWL LRG LVL3 (GOWN DISPOSABLE) ×20
HEMOSTAT POWDER SURGIFOAM 1G (HEMOSTASIS) ×12 IMPLANT
HEMOSTAT SURGICEL 2X14 (HEMOSTASIS) ×4 IMPLANT
INSERT FOGARTY XLG (MISCELLANEOUS) IMPLANT
KIT BASIN OR (CUSTOM PROCEDURE TRAY) ×4 IMPLANT
KIT ROOM TURNOVER OR (KITS) ×4 IMPLANT
KIT SUCTION CATH 14FR (SUCTIONS) ×4 IMPLANT
KIT VASOVIEW HEMOPRO VH 3000 (KITS) ×4 IMPLANT
LEAD PACING MYOCARDI (MISCELLANEOUS) ×4 IMPLANT
MARKER GRAFT CORONARY BYPASS (MISCELLANEOUS) ×12 IMPLANT
NS IRRIG 1000ML POUR BTL (IV SOLUTION) ×20 IMPLANT
PACK OPEN HEART (CUSTOM PROCEDURE TRAY) ×4 IMPLANT
PAD ARMBOARD 7.5X6 YLW CONV (MISCELLANEOUS) ×8 IMPLANT
PAD ELECT DEFIB RADIOL ZOLL (MISCELLANEOUS) ×4 IMPLANT
PENCIL BUTTON HOLSTER BLD 10FT (ELECTRODE) ×4 IMPLANT
PUNCH AORTIC ROTATE 4.0MM (MISCELLANEOUS) IMPLANT
PUNCH AORTIC ROTATE 4.5MM 8IN (MISCELLANEOUS) IMPLANT
PUNCH AORTIC ROTATE 5MM 8IN (MISCELLANEOUS) IMPLANT
SET CARDIOPLEGIA MPS 5001102 (MISCELLANEOUS) ×4 IMPLANT
SOLUTION ANTI FOG 6CC (MISCELLANEOUS) ×4 IMPLANT
SPONGE LAP 18X18 X RAY DECT (DISPOSABLE) ×4 IMPLANT
SURGIFLO W/THROMBIN 8M KIT (HEMOSTASIS) ×7 IMPLANT
SUT BONE WAX W31G (SUTURE) ×4 IMPLANT
SUT MNCRL AB 4-0 PS2 18 (SUTURE) ×4 IMPLANT
SUT PROLENE 3 0 SH DA (SUTURE) ×8 IMPLANT
SUT PROLENE 3 0 SH1 36 (SUTURE) IMPLANT
SUT PROLENE 4 0 RB 1 (SUTURE) ×2
SUT PROLENE 4 0 SH DA (SUTURE) ×8 IMPLANT
SUT PROLENE 4-0 RB1 .5 CRCL 36 (SUTURE) ×2 IMPLANT
SUT PROLENE 5 0 C 1 24 (SUTURE) ×4 IMPLANT
SUT PROLENE 5 0 C 1 36 (SUTURE) ×4 IMPLANT
SUT PROLENE 6 0 C 1 30 (SUTURE) ×16 IMPLANT
SUT PROLENE 6 0 CC (SUTURE) ×12 IMPLANT
SUT PROLENE 8 0 BV175 6 (SUTURE) IMPLANT
SUT PROLENE BLUE 7 0 (SUTURE) ×8 IMPLANT
SUT SILK  1 MH (SUTURE)
SUT SILK 1 MH (SUTURE) IMPLANT
SUT SILK 2 0 SH CR/8 (SUTURE) ×4 IMPLANT
SUT SILK 3 0 SH CR/8 (SUTURE) IMPLANT
SUT STEEL 6MS V (SUTURE) ×16 IMPLANT
SUT STEEL SZ 6 DBL 3X14 BALL (SUTURE) ×12 IMPLANT
SUT VIC AB 1 CTX 36 (SUTURE) ×4
SUT VIC AB 1 CTX36XBRD ANBCTR (SUTURE) ×4 IMPLANT
SUT VIC AB 2-0 CT1 27 (SUTURE) ×2
SUT VIC AB 2-0 CT1 TAPERPNT 27 (SUTURE) ×2 IMPLANT
SUT VIC AB 2-0 CTX 27 (SUTURE) IMPLANT
SUT VIC AB 3-0 X1 27 (SUTURE) IMPLANT
SUTURE E-PAK OPEN HEART (SUTURE) ×4 IMPLANT
SYSTEM SAHARA CHEST DRAIN ATS (WOUND CARE) ×4 IMPLANT
TAPE CLOTH SURG 4X10 WHT LF (GAUZE/BANDAGES/DRESSINGS) ×4 IMPLANT
TAPE PAPER 2X10 WHT MICROPORE (GAUZE/BANDAGES/DRESSINGS) ×4 IMPLANT
TOWEL GREEN STERILE (TOWEL DISPOSABLE) ×4 IMPLANT
TOWEL GREEN STERILE FF (TOWEL DISPOSABLE) ×3 IMPLANT
TOWEL OR 17X24 6PK STRL BLUE (TOWEL DISPOSABLE) IMPLANT
TOWEL OR 17X26 10 PK STRL BLUE (TOWEL DISPOSABLE) IMPLANT
TRAY FOLEY SILVER 16FR TEMP (SET/KITS/TRAYS/PACK) ×4 IMPLANT
TUBING INSUFFLATION (TUBING) ×4 IMPLANT
UNDERPAD 30X30 (UNDERPADS AND DIAPERS) ×4 IMPLANT
WATER STERILE IRR 1000ML POUR (IV SOLUTION) ×8 IMPLANT

## 2017-04-24 SURGICAL SUPPLY — 17 items
BALLN LINEAR 7.5FR IABP 40CC (BALLOONS) ×2
BALLOON LINEAR 7.5FR IABP 40CC (BALLOONS) ×1 IMPLANT
CATH INFINITI 5FR ANG PIGTAIL (CATHETERS) ×2 IMPLANT
CATH OPTITORQUE TIG 4.0 5F (CATHETERS) ×2 IMPLANT
COVER PRB 48X5XTLSCP FOLD TPE (BAG) ×2 IMPLANT
COVER PROBE 5X48 (BAG) ×2
DEVICE RAD COMP TR BAND LRG (VASCULAR PRODUCTS) ×2 IMPLANT
DEVICE SECURE STATLOCK IABP (MISCELLANEOUS) ×4 IMPLANT
ELECT DEFIB PAD ADLT CADENCE (PAD) ×2 IMPLANT
GLIDESHEATH SLEND A-KIT 6F 22G (SHEATH) ×2 IMPLANT
GUIDEWIRE INQWIRE 1.5J.035X260 (WIRE) ×1 IMPLANT
INQWIRE 1.5J .035X260CM (WIRE) ×2
KIT HEART LEFT (KITS) ×2 IMPLANT
PACK CARDIAC CATHETERIZATION (CUSTOM PROCEDURE TRAY) ×2 IMPLANT
SHEATH PINNACLE 6F 10CM (SHEATH) ×2 IMPLANT
TRANSDUCER W/STOPCOCK (MISCELLANEOUS) ×2 IMPLANT
TUBING CIL FLEX 10 FLL-RA (TUBING) ×2 IMPLANT

## 2017-04-24 NOTE — Anesthesia Procedure Notes (Signed)
Procedure Name: Intubation Date/Time: 04/24/2017 12:43 PM Performed by: Marena Chancy Pre-anesthesia Checklist: Patient identified, Emergency Drugs available, Suction available and Patient being monitored Patient Re-evaluated:Patient Re-evaluated prior to inductionOxygen Delivery Method: Circle system utilized Preoxygenation: Pre-oxygenation with 100% oxygen Intubation Type: IV induction Ventilation: Mask ventilation without difficulty Laryngoscope Size: Miller and 3 Grade View: Grade II Tube type: Oral Tube size: 8.0 mm Number of attempts: 1 Airway Equipment and Method: Stylet Placement Confirmation: positive ETCO2,  ETT inserted through vocal cords under direct vision and breath sounds checked- equal and bilateral Secured at: 24 cm Tube secured with: Tape Dental Injury: Teeth and Oropharynx as per pre-operative assessment

## 2017-04-24 NOTE — Anesthesia Postprocedure Evaluation (Addendum)
Anesthesia Post Note  Patient: Alan Diaz  Procedure(s) Performed: Procedure(s) (LRB): CORONARY ARTERY BYPASS GRAFTING (CABG) times three using left intenal mammary artery and right saphenous vein (N/A) TRANSESOPHAGEAL ECHOCARDIOGRAM (TEE) (N/A)  Patient location during evaluation: SICU Anesthesia Type: General Level of consciousness: patient remains intubated per anesthesia plan Pain management: pain level controlled Vital Signs Assessment: post-procedure vital signs reviewed and stable Respiratory status: patient on ventilator - see flowsheet for VS and patient remains intubated per anesthesia plan Cardiovascular status: blood pressure returned to baseline Anesthetic complications: no       Last Vitals:  Vitals:   04/24/17 2100 04/24/17 2115  BP: 101/66 (!) 102/57  Pulse: (!) 165 (!) 165  Resp: 14 15  Temp: (!) 35.8 C 36.1 C    Last Pain:  Vitals:   04/24/17 2000  TempSrc: Core (Comment)  PainSc:                  Corrina Steffensen COKER

## 2017-04-24 NOTE — Progress Notes (Addendum)
  Echocardiogram Echocardiogram Transesophageal has been performed.  Shalita Notte L Androw 04/24/2017, 1:43 PM

## 2017-04-24 NOTE — Progress Notes (Signed)
  Echocardiogram 2D Echocardiogram has been performed.  Alan Diaz 04/24/2017, 10:10 AM

## 2017-04-24 NOTE — Interval H&P Note (Signed)
History and Physical Interval Note:  04/24/2017 10:50 AM  Alan Diaz  has presented today for surgery, with the diagnosis of n stemi - CHF The various methods of treatment have been discussed with the patient and family. After consideration of risks, benefits and other options for treatment, the patient has consented to  Procedure(s): Left Heart Cath and Coronary Angiography (N/A) with Possible Percutaneous Coronary Intervention as a surgical intervention .  The patient's history has been reviewed, patient examined, no change in status, stable for surgery.  I have reviewed the patient's chart and labs.  Questions were answered to the patient's satisfaction.    Cath Lab Visit (complete for each Cath Lab visit)  Clinical Evaluation Leading to the Procedure:   ACS: Yes.    Non-ACS:   3 Anginal Classification: CCS III  Anti-ischemic medical therapy: Maximal Therapy (2 or more classes of medications)  Non-Invasive Test Results: No non-invasive testing performed  Prior CABG: No previous CABG   Bryan Lemma

## 2017-04-24 NOTE — Transfer of Care (Signed)
Immediate Anesthesia Transfer of Care Note  Patient: Alan Diaz  Procedure(s) Performed: Procedure(s): CORONARY ARTERY BYPASS GRAFTING (CABG) times three using left intenal mammary artery and right saphenous vein (N/A) TRANSESOPHAGEAL ECHOCARDIOGRAM (TEE) (N/A)  Patient Location: ICU  Anesthesia Type:General  Level of Consciousness: unresponsive and Patient remains intubated per anesthesia plan  Airway & Oxygen Therapy: Patient remains intubated per anesthesia plan and Patient placed on Ventilator (see vital sign flow sheet for setting)  Post-op Assessment: Report given to RN and Post -op Vital signs reviewed and stable  Post vital signs: Reviewed and stable  Last Vitals:  Vitals:   04/24/17 1208 04/24/17 1210  BP: 100/67 99/63  Pulse: 74 74  Resp: 14 14  Temp:      Last Pain:  Vitals:   04/24/17 0905  TempSrc:   PainSc: 0-No pain         Complications: No apparent anesthesia complications

## 2017-04-24 NOTE — H&P (View-Only) (Signed)
Progress Note  Patient Name: Alan Diaz Date of Encounter: 04/23/2017  Primary Cardiologist: Dietrich Pates MD  Subjective   Feels much better today. States SOB resolved. No edema. No chest pain.  Inpatient Medications    Scheduled Meds: . aspirin EC  81 mg Oral Daily  . atorvastatin  80 mg Oral q1800  . carvedilol  12.5 mg Oral BID WC  . furosemide  40 mg Intravenous BID  . insulin aspart  0-15 Units Subcutaneous TID WC  . insulin aspart  0-5 Units Subcutaneous QHS  . potassium chloride SA  20 mEq Oral Daily   Continuous Infusions: . heparin 1,300 Units/hr (04/23/17 0556)   PRN Meds: acetaminophen, guaiFENesin-dextromethorphan, nitroGLYCERIN, ondansetron (ZOFRAN) IV   Vital Signs    Vitals:   04/23/17 0200 04/23/17 0547 04/23/17 0555 04/23/17 0850  BP: 106/79  117/73 114/74  Pulse:    95  Resp: (!) 26  (!) 21   Temp:   98.9 F (37.2 C)   TempSrc:   Oral   SpO2:   97%   Weight:  186 lb 11.2 oz (84.7 kg)    Height:        Intake/Output Summary (Last 24 hours) at 04/23/17 0950 Last data filed at 04/23/17 0700  Gross per 24 hour  Intake           4836.4 ml  Output             2825 ml  Net           2011.4 ml   Filed Weights   04/22/17 2005 04/23/17 0547  Weight: 185 lb 11.2 oz (84.2 kg) 186 lb 11.2 oz (84.7 kg)    Telemetry    NSR with PVCs. One triplet- Personally Reviewed  ECG     Pending today- Personally Reviewed  Physical Exam   GEN: WDBM No acute distress.   Neck: No JVD, no bruits Cardiac: RRR, no murmurs, rubs, or gallops. No edema. Pulses 2+ equal. Respiratory: Clear to auscultation bilaterally. GI: Soft, nontender, non-distended  MS: No edema; No deformity. Neuro:  Nonfocal  Psych: Normal affect   Labs    Chemistry Recent Labs Lab 04/22/17 1140 04/23/17 0423  NA 136 136  K 4.4 3.7  CL 101 97*  CO2 25 29  GLUCOSE 314* 161*  BUN 13 10  CREATININE 1.42* 1.38*  CALCIUM 8.8* 8.8*  GFRNONAA 55* 57*  GFRAA >60 >60  ANIONGAP  10 10     Hematology Recent Labs Lab 04/22/17 1140 04/23/17 0423  WBC 10.7* 9.7  RBC 4.26 4.31  HGB 13.6 13.7  HCT 40.2 40.6  MCV 94.4 94.2  MCH 31.9 31.8  MCHC 33.8 33.7  RDW 13.6 13.6  PLT 221 211    Cardiac Enzymes Recent Labs Lab 04/22/17 1700 04/22/17 2328 04/23/17 0423  TROPONINI 4.73* 3.86* 4.04*    Recent Labs Lab 04/22/17 1220  TROPIPOC 3.12*     BNP Recent Labs Lab 04/22/17 1255  BNP 960.3*     DDimer No results for input(s): DDIMER in the last 168 hours.   Radiology    Dg Chest 2 View  Result Date: 04/22/2017 CLINICAL DATA:  Shortness of breath and chest pain.  Cough. EXAM: CHEST  2 VIEW COMPARISON:  January 11, 2017 FINDINGS: There is fine interstitial opacity in both perihilar regions. There are small pleural effusions bilaterally. There is evidence of atelectatic change in both lung bases with evidence of bullous disease in the left base.  The heart size is within normal limits. Pulmonary vascularity is normal. No adenopathy evident. No bone lesions. IMPRESSION: Small pleural effusions bilaterally. Fine interstitial opacity in both perihilar regions. This appearance could be indicative of atypical congestive heart failure but also may be indicative of interstitial pneumonitis of a variety of etiologies, both infectious and noninfectious. There is no cardiomegaly. Pulmonary vascularity appears within normal limits. No evident adenopathy. Bullous disease noted left base. Electronically Signed   By: Bretta Bang III M.D.   On: 04/22/2017 12:15    Cardiac Studies   Echo pending.  Patient Profile     52 y.o. male with past medical history of CAD (s/p DES to pLAD and dRCA in 05/2015), ischemic cardiomyopathy, chronic combined systolic and diastolic CHF (EF previously 25-30%, improved to 35-40% by echo in 08/2016), HTN, HLD, and tobacco use who presents to [facility] ED on 04/22/2017 for evaluation of chest pain and worsening dyspnea.   Assessment &  Plan    1. Acute on chronic combined systolic/diastolic CHF. Last EF 35-40% by Echo in September 2017. Will update today. Clinically has responded to IV diuresis. I/O inaccurate (4.2 liter IV fluid intake is grossly inaccurate since only on IV heparin). Will continue diuresis today. ACEi on hold with CKD. Will add low dose nitrate. Sodium restriction 2. NSTEMI. Troponin up to 4.73. Ecg pending this am. This may be an ACS versus demand ischemia from CHF and CKD. If renal function stable will plan cardiac cath tomorrow to access coronary anatomy. On IV heparin for now. Daily weight.  3. HTN controlled. 4. DM type 2. Metformin on hold for cath. On SSI 5. HLD. On high dose statin. 6. Noncompliance. Patient has been noncompliant with medication. Stressed importance of taking meds. He reports motivation to follow recommendations. 7. Tobacco abuse counseled on cessation. 8. CKD creatinine improved 1.42>>1.38- monitor.   Signed, Nigel Ericsson [place], MD  04/23/2017, 9:50 AM

## 2017-04-24 NOTE — Progress Notes (Signed)
6 french slender sheath removed from right radial artery and TR band applied at 12cc of air.  Site looks good no hematoma.  RN will deflate band per protocol.

## 2017-04-24 NOTE — Anesthesia Procedure Notes (Addendum)
Central Venous Catheter Insertion Performed by: Suzette Battiest, anesthesiologist Start/End4/25/2018 12:48 PM, 04/24/2017 12:58 PM Patient location: OR. Preanesthetic checklist: patient identified, IV checked, site marked, risks and benefits discussed, surgical consent, monitors and equipment checked, pre-op evaluation, timeout performed and anesthesia consent Position: Trendelenburg Lidocaine 1% used for infiltration and patient sedated Hand hygiene performed , maximum sterile barriers used  and Seldinger technique used Catheter size: 9 Fr Total catheter length 10. Central line and PA cath was placed.MAC introducer Swan type:thermodilution PA Cath depth:50 Procedure performed using ultrasound guided technique. Ultrasound Notes:anatomy identified, needle tip was noted to be adjacent to the nerve/plexus identified, no ultrasound evidence of intravascular and/or intraneural injection and image(s) printed for medical record Attempts: 1 Following insertion, line sutured and dressing applied. Post procedure assessment: blood return through all ports, free fluid flow and no air  Patient tolerated the procedure well with no immediate complications.

## 2017-04-24 NOTE — Progress Notes (Signed)
[facility] BRIEF SICU PROGRESS NOTE  Day of Surgery  S/P Procedure(s) (LRB): CORONARY ARTERY BYPASS GRAFTING (CABG) times three using left intenal mammary artery and right saphenous vein (N/A) TRANSESOPHAGEAL ECHOCARDIOGRAM (TEE) (N/A)   Just arrived from OR Sedated on vent NSR w/ stable hemodynamics on Epi, levophed and milrinone drips w/ IABP 1:1 O2 sats 100% Chest tube output low Labs pending  Plan: Routine early postop  Purcell Nails, MD 04/24/2017 6:32 PM

## 2017-04-24 NOTE — Brief Op Note (Signed)
04/22/2017 - 04/24/2017  4:17 PM  PATIENT:  Alan Diaz  52 y.o. male  PRE-OPERATIVE DIAGNOSIS:  CAD LMD  POST-OPERATIVE DIAGNOSIS:  CAD LMD  PROCEDURE:  Procedure(s): CORONARY ARTERY BYPASS GRAFTING (CABG) (N/A) TRANSESOPHAGEAL ECHOCARDIOGRAM (TEE) (N/A) LIMA-LAD SVG-OM SVG-PD  SURGEON:  Surgeon(s) and Role:    * Kerin Perna, MD - Primary  PHYSICIAN ASSISTANT: Concepcion Gillott PA-C  ANESTHESIA:   general  EBL:  Total I/O In: 145.3 [I.V.:145.3] Out: 450 [Urine:450]  BLOOD ADMINISTERED:none  DRAINS: ROUTINE   LOCAL MEDICATIONS USED:  NONE  SPECIMEN:  No Specimen  DISPOSITION OF SPECIMEN:  N/A  COUNTS:  YES  TOURNIQUET:  * No tourniquets in log *  DICTATION: .Other Dictation: Dictation Number PENDING  PLAN OF CARE: Admit to inpatient   PATIENT DISPOSITION:  ICU - intubated and hemodynamically stable.   Delay start of Pharmacological VTE agent (>24hrs) due to surgical blood loss or risk of bleeding: yes  COMPLICATIONS: NO KNOWN

## 2017-04-24 NOTE — Anesthesia Procedure Notes (Signed)
Central Venous Catheter Insertion Performed by: Marcene Duos, anesthesiologist Start/End4/25/2018 12:48 PM, 04/24/2017 12:58 PM Patient location: Pre-op. Preanesthetic checklist: patient identified, IV checked, site marked, risks and benefits discussed, surgical consent, monitors and equipment checked, pre-op evaluation, timeout performed and anesthesia consent Hand hygiene performed  and maximum sterile barriers used  PA cath was placed.Swan type:thermodilution Procedure performed using ultrasound guided technique. Ultrasound Notes:anatomy identified, needle tip was noted to be adjacent to the nerve/plexus identified, no ultrasound evidence of intravascular and/or intraneural injection and image(s) printed for medical record Attempts: 1 Patient tolerated the procedure well with no immediate complications.

## 2017-04-24 NOTE — Progress Notes (Signed)
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 Echo images also reviewed. Dr Herbie Baltimore has recommended emergency CABG for this patient. I have discuseed the procedure of CAD with the pa tient for therapy of his CAD and LV dysfx as well as the risks of sugery[ death, stroke, bleeding,LVAD]  Objective: Vital signs in last 24 hours: Temp:  [98.2 F (36.8 C)-99.1 F (37.3 C)] 99.1 F (37.3 C) (04/25 0444) Pulse Rate:  [76-92] 76 (04/25 0905) Cardiac Rhythm: Normal sinus rhythm (04/25 0900) Resp:  [20-22] 20 (04/25 0444) BP: (97-122)/(67-71) 98/69 (04/25 1158) SpO2:  [95 %-98 %] 96 % (04/25 1049) Weight:  [185 lb 3.2 oz (84 kg)] 185 lb 3.2 oz (84 kg) (04/25 0444)  Hemodynamic parameters for last 24 hours:  LVEDP 20 SAP 88  Intake/Output from previous day: 04/24 0701 - 04/25 0700 In: 1045.6 [P.O.:745; I.V.:300.6] Out: 1800 [Urine:1800] Intake/Output this shift:       Exam    General- alert and anxious with R leg IABP   Lungs- scatteredt rales, wheezes   Cor- regular rate and rhythm, no murmur , gallop   Abdomen- soft, non-tender   Extremities - warm, non-tender, minimal edema   Neuro- oriented, appropriate, no focal weakness    Lab Results:  Recent Labs  04/23/17 0423 04/24/17 0545  WBC 9.7 6.8  HGB 13.7 12.6*  HCT 40.6 37.2*  PLT 211 179   BMET:  Recent Labs  04/23/17 0423 04/24/17 0545  NA 136 136  K 3.7 3.2*  CL 97* 100*  CO2 29 26  GLUCOSE 161* 156*  BUN 10 11  CREATININE 1.38* 1.27*  CALCIUM 8.8* 8.6*    PT/INR:  Recent Labs  04/24/17 0545  LABPROT 14.1  INR 1.09   ABG    Component Value Date/Time   PHART 7.386 05/03/2015  1544   HCO3 26.0 (H) 05/03/2015 1544   TCO2 27 05/03/2015 1544   ACIDBASEDEF 1.0 05/03/2015 1540   O2SAT 99.0 05/03/2015 1544   CBG (last 3)   Recent Labs  04/23/17 1644 04/23/17 2050 04/24/17 0745  GLUCAP 145* 336* 142*    Assessment/Plan: S/P Procedure(s) (LRB): Left Heart Cath and Coronary Angiography (N/A) IABP Insertion (N/A) Emergency CABG   LOS: 2 days    Kathlee Nations Trigt III 04/24/2017

## 2017-04-24 NOTE — Progress Notes (Signed)
Progress Note  Patient Name: Alan Diaz Date of Encounter: 04/24/2017  Primary Cardiologist: Tenny Craw  Subjective   No chest pain this morning, breathing is good.   Inpatient Medications    Scheduled Meds: . aspirin EC  81 mg Oral Daily  . atorvastatin  80 mg Oral q1800  . carvedilol  12.5 mg Oral BID WC  . furosemide  40 mg Intravenous BID  . insulin aspart  0-15 Units Subcutaneous TID WC  . insulin aspart  0-5 Units Subcutaneous QHS  . isosorbide mononitrate  15 mg Oral Daily  . potassium chloride SA  40 mEq Oral Daily  . sodium chloride flush  3 mL Intravenous Q12H   Continuous Infusions: . sodium chloride    . sodium chloride 10 mL/hr at 04/24/17 0619  . heparin 1,550 Units/hr (04/24/17 0001)   PRN Meds: sodium chloride, acetaminophen, guaiFENesin-dextromethorphan, nitroGLYCERIN, ondansetron (ZOFRAN) IV, sodium chloride flush   Vital Signs    Vitals:   04/23/17 0850 04/23/17 1645 04/23/17 2100 04/24/17 0444  BP: 114/74 97/67  122/71  Pulse: 95 92  90  Resp:  (!) 22  20  Temp:  98.2 F (36.8 C) 99 F (37.2 C) 99.1 F (37.3 C)  TempSrc:  Oral Oral Oral  SpO2:  98%  95%  Weight:    185 lb 3.2 oz (84 kg)  Height:        Intake/Output Summary (Last 24 hours) at 04/24/17 3976 Last data filed at 04/24/17 0300  Gross per 24 hour  Intake          1045.58 ml  Output             1800 ml  Net          -754.42 ml   Filed Weights   04/22/17 2005 04/23/17 0547 04/24/17 0444  Weight: 185 lb 11.2 oz (84.2 kg) 186 lb 11.2 oz (84.7 kg) 185 lb 3.2 oz (84 kg)    Telemetry    SR - Personally Reviewed  ECG    SR with LVH - Personally Reviewed  Physical Exam   General: Well developed, well nourished, male appearing in no acute distress. Head: Normocephalic, atraumatic.  Neck: Supple without bruits, JVD. Lungs:  Resp regular and unlabored, CTA. Heart: RRR, S1, S2, no S3, S4, or murmur; no rub. Abdomen: Soft, non-tender, non-distended with normoactive bowel  sounds. No hepatomegaly. No rebound/guarding. No obvious abdominal masses. Extremities: No clubbing, cyanosis, edema. Distal pedal pulses are 2+ bilaterally. Neuro: Alert and oriented X 3. Moves all extremities spontaneously. Psych: Normal affect.  Labs    Chemistry Recent Labs Lab 04/22/17 1140 04/23/17 0423 04/24/17 0545  NA 136 136 136  K 4.4 3.7 3.2*  CL 101 97* 100*  CO2 25 29 26   GLUCOSE 314* 161* 156*  BUN 13 10 11   CREATININE 1.42* 1.38* 1.27*  CALCIUM 8.8* 8.8* 8.6*  GFRNONAA 55* 57* >60  GFRAA >60 >60 >60  ANIONGAP 10 10 10      Hematology Recent Labs Lab 04/22/17 1140 04/23/17 0423 04/24/17 0545  WBC 10.7* 9.7 6.8  RBC 4.26 4.31 4.01*  HGB 13.6 13.7 12.6*  HCT 40.2 40.6 37.2*  MCV 94.4 94.2 92.8  MCH 31.9 31.8 31.4  MCHC 33.8 33.7 33.9  RDW 13.6 13.6 13.6  PLT 221 211 179    Cardiac Enzymes Recent Labs Lab 04/22/17 1700 04/22/17 2328 04/23/17 0423  TROPONINI 4.73* 3.86* 4.04*    Recent Labs Lab 04/22/17 1220  TROPIPOC 3.12*  BNP Recent Labs Lab 04/22/17 1255  BNP 960.3*     DDimer No results for input(s): DDIMER in the last 168 hours.    Radiology    Dg Chest 2 View  Result Date: 04/22/2017 CLINICAL DATA:  Shortness of breath and chest pain.  Cough. EXAM: CHEST  2 VIEW COMPARISON:  January 11, 2017 FINDINGS: There is fine interstitial opacity in both perihilar regions. There are small pleural effusions bilaterally. There is evidence of atelectatic change in both lung bases with evidence of bullous disease in the left base. The heart size is within normal limits. Pulmonary vascularity is normal. No adenopathy evident. No bone lesions. IMPRESSION: Small pleural effusions bilaterally. Fine interstitial opacity in both perihilar regions. This appearance could be indicative of atypical congestive heart failure but also may be indicative of interstitial pneumonitis of a variety of etiologies, both infectious and noninfectious. There is no  cardiomegaly. Pulmonary vascularity appears within normal limits. No evident adenopathy. Bullous disease noted left base. Electronically Signed   By: Bretta Bang III M.D.   On: 04/22/2017 12:15    Cardiac Studies   TTE: pending  Patient Profile     52 y.o. male with past medical history of CAD (s/p DES to pLAD and dRCA in 05/2015), ischemic cardiomyopathy, chronic combined systolic and diastolic CHF (EF previously 25-30%, improved to 35-40% by echo in 08/2016), HTN, HLD, and tobacco use who presents to [facility] ED on 04/22/2017 for evaluation of chest pain and worsening dyspnea.    Assessment & Plan    1. Acute on chronic combined systolic/diastolic CHF. Last EF 35-40% by Echo in September 2017. Repeat pending. -- Clinically has responded to IV diuresis. Net Neg 2.7L. Weight trending down. ACEi on hold with CKD. Will hold lasix today as breathing is stable, with plans for cath today.  -- On low dose nitrate. Sodium restriction.   2. NSTEMI. Troponin up to 4.73. Ecg pending this am.This may be an ACS versus demand ischemia from CHF and CKD. Planned for cath today. On IV heparin for now.   3. HTN controlled.  4. DM type 2. Metformin on hold for cath. On SSI  5. HLD. On high dose statin.  6. Noncompliance. Patient has been noncompliant with medication. Stressed importance of taking meds. He reports motivation to follow recommendations.  7. Tobacco abuse counseled on cessation.  8. CKD creatinine improved 1.42>>1.38>> 1.27. Follow BMET  Signed, Laverda Page, NP  04/24/2017, 8:07 AM   Patient seen and examined and history reviewed. Agree with above findings and plan. Patient in cath lab now for procedure. Renal function improved. Good diuresis. Await cath results.  Peter [place], MDFACC 04/24/2017 12:49 PM

## 2017-04-24 NOTE — Progress Notes (Signed)
ANTICOAGULATION CONSULT NOTE - Follow Up Consult  Pharmacy Consult for Heparin  Indication: chest pain/ACS  Allergies  Allergen Reactions  . Aspirin Hives and Other (See Comments)    Makes pt feel funny  . Other Nausea And Vomiting and Other (See Comments)    Grape products   Patient Measurements: Height: 6' (182.9 cm) Weight: 185 lb 3.2 oz (84 kg) IBW/kg (Calculated) : 77.6  Vital Signs: Temp: 99.1 F (37.3 C) (04/25 0444) Temp Source: Oral (04/25 0444) BP: 105/70 (04/25 0905) Pulse Rate: 76 (04/25 0905)  Labs:  Recent Labs  04/22/17 1140 04/22/17 1700  04/22/17 2328 04/23/17 0423 04/23/17 0921 04/23/17 1617 04/24/17 0545  HGB 13.6  --   --   --  13.7  --   --  12.6*  HCT 40.2  --   --   --  40.6  --   --  37.2*  PLT 221  --   --   --  211  --   --  179  LABPROT  --   --   --   --   --   --   --  14.1  INR  --   --   --   --   --   --   --  1.09  HEPARINUNFRC  --   --   < > 0.19*  --  0.17* 0.50 0.33  CREATININE 1.42*  --   --   --  1.38*  --   --  1.27*  TROPONINI  --  4.73*  --  3.86* 4.04*  --   --   --   < > = values in this interval not displayed.  Estimated Creatinine Clearance: 74.7 mL/min (A) (by C-G formula based on SCr of 1.27 mg/dL (H)).   Assessment: 52 y/o M on heparin for chest pain Cath planned for today  Heparin level therapeutic CBC stable  Goal of Therapy:  Heparin level 0.3-0.7 units/ml Monitor platelets by anticoagulation protocol: Yes   Plan:  Continue heparin at 1550 units / hr Follow up after cath  Thank you Okey Regal, PharmD [phone] 04/24/2017,9:54 AM

## 2017-04-24 NOTE — Anesthesia Preprocedure Evaluation (Addendum)
Anesthesia Evaluation  Patient identified by MRN, date of birth, ID band Patient awake    Reviewed: Allergy & Precautions, NPO status , Patient's Chart, lab work & pertinent test resultsPreop documentation limited or incomplete due to emergent nature of procedure.  Airway Mallampati: III  TM Distance: >3 FB Neck ROM: Full    Dental  (+) Dental Advisory Given   Pulmonary Current Smoker,    breath sounds clear to auscultation       Cardiovascular hypertension, Pt. on medications and Pt. on home beta blockers + CAD, + Past MI, + Cardiac Stents, + Peripheral Vascular Disease and +CHF   Rhythm:Regular Rate:Normal  Moderately dilated LV with mild LV hypertrophy. EF 25%, diffuse  hypokinesis. Normal RV size and systolic function. No significant valvular abnormalities.   Neuro/Psych negative neurological ROS     GI/Hepatic Neg liver ROS, GERD  ,  Endo/Other  diabetes, Type 2  Renal/GU Renal disease     Musculoskeletal   Abdominal   Peds  Hematology negative hematology ROS (+)   Anesthesia Other Findings   Reproductive/Obstetrics                            Lab Results  Component Value Date   WBC 6.8 04/24/2017   HGB 12.6 (L) 04/24/2017   HCT 37.2 (L) 04/24/2017   MCV 92.8 04/24/2017   PLT 179 04/24/2017   Lab Results  Component Value Date   CREATININE 1.27 (H) 04/24/2017   BUN 11 04/24/2017   NA 136 04/24/2017   K 3.2 (L) 04/24/2017   CL 100 (L) 04/24/2017   CO2 26 04/24/2017    Anesthesia Physical Anesthesia Plan  ASA: IV and emergent  Anesthesia Plan: General   Post-op Pain Management:    Induction: Intravenous  Airway Management Planned: Oral ETT  Additional Equipment: Arterial line, CVP, PA Cath, Ultrasound Guidance Line Placement and TEE  Intra-op Plan:   Post-operative Plan: Post-operative intubation/ventilation  Informed Consent: I have reviewed the patients History  and Physical, chart, labs and discussed the procedure including the risks, benefits and alternatives for the proposed anesthesia with the patient or authorized representative who has indicated his/her understanding and acceptance.   Dental advisory given  Plan Discussed with: CRNA  Anesthesia Plan Comments:         Anesthesia Quick Evaluation

## 2017-04-25 ENCOUNTER — Inpatient Hospital Stay (HOSPITAL_COMMUNITY): Payer: Medicaid Other

## 2017-04-25 ENCOUNTER — Encounter (HOSPITAL_COMMUNITY): Payer: Self-pay | Admitting: Cardiothoracic Surgery

## 2017-04-25 DIAGNOSIS — E785 Hyperlipidemia, unspecified: Secondary | ICD-10-CM

## 2017-04-25 DIAGNOSIS — I5041 Acute combined systolic (congestive) and diastolic (congestive) heart failure: Secondary | ICD-10-CM

## 2017-04-25 LAB — POCT I-STAT 3, ART BLOOD GAS (G3+)
Acid-base deficit: 3 mmol/L — ABNORMAL HIGH (ref 0.0–2.0)
Acid-base deficit: 4 mmol/L — ABNORMAL HIGH (ref 0.0–2.0)
Acid-base deficit: 3 mmol/L — ABNORMAL HIGH (ref 0.0–2.0)
Acid-base deficit: 5 mmol/L — ABNORMAL HIGH (ref 0.0–2.0)
Bicarbonate: 22.5 mmol/L (ref 20.0–28.0)
Bicarbonate: 21.7 mmol/L (ref 20.0–28.0)
Bicarbonate: 22.6 mmol/L (ref 20.0–28.0)
Bicarbonate: 20.6 mmol/L (ref 20.0–28.0)
O2 Saturation: 99 %
O2 Saturation: 98 %
O2 Saturation: 98 %
O2 Saturation: 96 %
Patient temperature: 38.1
Patient temperature: 37.2
Patient temperature: 37
Patient temperature: 37.1
TCO2: 24 mmol/L (ref 0–100)
TCO2: 23 mmol/L (ref 0–100)
TCO2: 24 mmol/L (ref 0–100)
TCO2: 22 mmol/L (ref 0–100)
pCO2 arterial: 42.7 mmHg (ref 32.0–48.0)
pCO2 arterial: 40.3 mmHg (ref 32.0–48.0)
pCO2 arterial: 41.7 mmHg (ref 32.0–48.0)
pCO2 arterial: 37.7 mmHg (ref 32.0–48.0)
pH, Arterial: 7.335 — ABNORMAL LOW (ref 7.350–7.450)
pH, Arterial: 7.34 — ABNORMAL LOW (ref 7.350–7.450)
pH, Arterial: 7.342 — ABNORMAL LOW (ref 7.350–7.450)
pH, Arterial: 7.347 — ABNORMAL LOW (ref 7.350–7.450)
pO2, Arterial: 149 mmHg — ABNORMAL HIGH (ref 83.0–108.0)
pO2, Arterial: 108 mmHg (ref 83.0–108.0)
pO2, Arterial: 108 mmHg (ref 83.0–108.0)
pO2, Arterial: 83 mmHg (ref 83.0–108.0)

## 2017-04-25 LAB — GLUCOSE, CAPILLARY
Glucose-Capillary: 101 mg/dL — ABNORMAL HIGH (ref 65–99)
Glucose-Capillary: 102 mg/dL — ABNORMAL HIGH (ref 65–99)
Glucose-Capillary: 108 mg/dL — ABNORMAL HIGH (ref 65–99)
Glucose-Capillary: 108 mg/dL — ABNORMAL HIGH (ref 65–99)
Glucose-Capillary: 111 mg/dL — ABNORMAL HIGH (ref 65–99)
Glucose-Capillary: 112 mg/dL — ABNORMAL HIGH (ref 65–99)
Glucose-Capillary: 124 mg/dL — ABNORMAL HIGH (ref 65–99)
Glucose-Capillary: 124 mg/dL — ABNORMAL HIGH (ref 65–99)
Glucose-Capillary: 126 mg/dL — ABNORMAL HIGH (ref 65–99)
Glucose-Capillary: 151 mg/dL — ABNORMAL HIGH (ref 65–99)
Glucose-Capillary: 159 mg/dL — ABNORMAL HIGH (ref 65–99)
Glucose-Capillary: 169 mg/dL — ABNORMAL HIGH (ref 65–99)
Glucose-Capillary: 175 mg/dL — ABNORMAL HIGH (ref 65–99)
Glucose-Capillary: 90 mg/dL (ref 65–99)
Glucose-Capillary: 95 mg/dL (ref 65–99)
Glucose-Capillary: 99 mg/dL (ref 65–99)

## 2017-04-25 LAB — POCT I-STAT, CHEM 8
BUN: 9 mg/dL (ref 6–20)
Calcium, Ion: 1.1 mmol/L — ABNORMAL LOW (ref 1.15–1.40)
Chloride: 103 mmol/L (ref 101–111)
Creatinine, Ser: 1.2 mg/dL (ref 0.61–1.24)
Glucose, Bld: 172 mg/dL — ABNORMAL HIGH (ref 65–99)
HCT: 27 % — ABNORMAL LOW (ref 39.0–52.0)
Hemoglobin: 9.2 g/dL — ABNORMAL LOW (ref 13.0–17.0)
Potassium: 4 mmol/L (ref 3.5–5.1)
Sodium: 135 mmol/L (ref 135–145)
TCO2: 21 mmol/L (ref 0–100)

## 2017-04-25 LAB — CBC
HCT: 28.5 % — ABNORMAL LOW (ref 39.0–52.0)
HCT: 28.9 % — ABNORMAL LOW (ref 39.0–52.0)
HEMOGLOBIN: 9.7 g/dL — AB (ref 13.0–17.0)
HEMOGLOBIN: 9.5 g/dL — AB (ref 13.0–17.0)
MCH: 31.7 pg (ref 26.0–34.0)
MCH: 30.9 pg (ref 26.0–34.0)
MCHC: 34 g/dL (ref 30.0–36.0)
MCHC: 32.9 g/dL (ref 30.0–36.0)
MCV: 93.1 fL (ref 78.0–100.0)
MCV: 94.1 fL (ref 78.0–100.0)
Platelets: 126 10*3/uL — ABNORMAL LOW (ref 150–400)
Platelets: 133 10*3/uL — ABNORMAL LOW (ref 150–400)
RBC: 3.06 MIL/uL — ABNORMAL LOW (ref 4.22–5.81)
RBC: 3.07 MIL/uL — ABNORMAL LOW (ref 4.22–5.81)
RDW: 13.5 % (ref 11.5–15.5)
RDW: 13.6 % (ref 11.5–15.5)
WBC: 9.4 10*3/uL (ref 4.0–10.5)
WBC: 14.5 10*3/uL — ABNORMAL HIGH (ref 4.0–10.5)

## 2017-04-25 LAB — SURGICAL PCR SCREEN
MRSA, PCR: NEGATIVE
Staphylococcus aureus: NEGATIVE

## 2017-04-25 LAB — BASIC METABOLIC PANEL
Anion gap: 4 — ABNORMAL LOW (ref 5–15)
BUN: 9 mg/dL (ref 6–20)
CHLORIDE: 108 mmol/L (ref 101–111)
CO2: 24 mmol/L (ref 22–32)
CREATININE: 1.13 mg/dL (ref 0.61–1.24)
Calcium: 7.8 mg/dL — ABNORMAL LOW (ref 8.9–10.3)
GFR calc non Af Amer: >60 mL/min (ref >60)
Glucose, Bld: 94 mg/dL (ref 65–99)
POTASSIUM: 3.8 mmol/L (ref 3.5–5.1)
SODIUM: 136 mmol/L (ref 135–145)

## 2017-04-25 LAB — MRSA PCR SCREENING: MRSA by PCR: NEGATIVE

## 2017-04-25 LAB — CREATININE, SERUM
Creatinine, Ser: 1.38 mg/dL — ABNORMAL HIGH (ref 0.61–1.24)
GFR calc Af Amer: >60 mL/min (ref >60)
GFR calc non Af Amer: 57 mL/min — ABNORMAL LOW (ref >60)

## 2017-04-25 LAB — MAGNESIUM
MAGNESIUM: 2.2 mg/dL (ref 1.7–2.4)
MAGNESIUM: 1.7 mg/dL (ref 1.7–2.4)

## 2017-04-25 MED ORDER — AMIODARONE HCL IN DEXTROSE 360-4.14 MG/200ML-% IV SOLN
60.0000 mg/h | INTRAVENOUS | Status: AC
  Administered 2017-04-25 (×2): 60 mg/h via INTRAVENOUS
  Filled 2017-04-25 (×2): qty 200

## 2017-04-25 MED ORDER — AMIODARONE HCL IN DEXTROSE 360-4.14 MG/200ML-% IV SOLN
30.0000 mg/h | INTRAVENOUS | Status: DC
  Administered 2017-04-26: 30 mg/h via INTRAVENOUS
  Filled 2017-04-25: qty 200

## 2017-04-25 MED ORDER — INSULIN DETEMIR 100 UNIT/ML ~~LOC~~ SOLN
10.0000 [IU] | Freq: Two times a day (BID) | SUBCUTANEOUS | Status: DC
  Administered 2017-04-25: 10 [IU] via SUBCUTANEOUS
  Filled 2017-04-25 (×2): qty 0.1

## 2017-04-25 MED ORDER — ACETAMINOPHEN 10 MG/ML IV SOLN
1000.0000 mg | Freq: Four times a day (QID) | INTRAVENOUS | Status: AC
  Administered 2017-04-25: 1000 mg via INTRAVENOUS
  Filled 2017-04-25: qty 100

## 2017-04-25 MED ORDER — FUROSEMIDE 10 MG/ML IJ SOLN
40.0000 mg | Freq: Once | INTRAMUSCULAR | Status: AC
  Administered 2017-04-25: 40 mg via INTRAVENOUS
  Filled 2017-04-25: qty 4

## 2017-04-25 MED ORDER — VANCOMYCIN HCL IN DEXTROSE 1-5 GM/200ML-% IV SOLN
1000.0000 mg | Freq: Once | INTRAVENOUS | Status: AC
  Administered 2017-04-25: 1000 mg via INTRAVENOUS
  Filled 2017-04-25: qty 200

## 2017-04-25 MED ORDER — INSULIN ASPART 100 UNIT/ML ~~LOC~~ SOLN: 0.0000 [IU] | SUBCUTANEOUS | Status: DC

## 2017-04-25 MED ORDER — FUROSEMIDE 10 MG/ML IJ SOLN
8.0000 mg/h | INTRAVENOUS | Status: DC
  Administered 2017-04-25 – 2017-04-26 (×2): 8 mg/h via INTRAVENOUS
  Filled 2017-04-25 (×3): qty 25

## 2017-04-25 MED ORDER — INSULIN ASPART 100 UNIT/ML ~~LOC~~ SOLN
0.0000 [IU] | SUBCUTANEOUS | Status: DC
  Administered 2017-04-25: 2 [IU] via SUBCUTANEOUS
  Administered 2017-04-25: 4 [IU] via SUBCUTANEOUS
  Administered 2017-04-25: 2 [IU] via SUBCUTANEOUS
  Administered 2017-04-26: 4 [IU] via SUBCUTANEOUS
  Administered 2017-04-26 (×2): 8 [IU] via SUBCUTANEOUS
  Administered 2017-04-26: 4 [IU] via SUBCUTANEOUS
  Administered 2017-04-26: 2 [IU] via SUBCUTANEOUS
  Administered 2017-04-26: 12 [IU] via SUBCUTANEOUS
  Administered 2017-04-26: 2 [IU] via SUBCUTANEOUS
  Administered 2017-04-27: 8 [IU] via SUBCUTANEOUS
  Administered 2017-04-27: 4 [IU] via SUBCUTANEOUS
  Administered 2017-04-27: 8 [IU] via SUBCUTANEOUS
  Administered 2017-04-27: 4 [IU] via SUBCUTANEOUS
  Administered 2017-04-27: 2 [IU] via SUBCUTANEOUS
  Administered 2017-04-28: 20 [IU] via SUBCUTANEOUS
  Administered 2017-04-28: 8 [IU] via SUBCUTANEOUS
  Administered 2017-04-28: 4 [IU] via SUBCUTANEOUS
  Administered 2017-04-28: 2 [IU] via SUBCUTANEOUS
  Administered 2017-04-28: 12 [IU] via SUBCUTANEOUS
  Administered 2017-04-28 – 2017-04-29 (×2): 2 [IU] via SUBCUTANEOUS

## 2017-04-25 MED ORDER — AMIODARONE IV BOLUS ONLY 150 MG/100ML
150.0000 mg | Freq: Once | INTRAVENOUS | Status: AC
  Administered 2017-04-25: 150 mg via INTRAVENOUS
  Filled 2017-04-25: qty 100

## 2017-04-25 MED ORDER — INSULIN DETEMIR 100 UNIT/ML ~~LOC~~ SOLN
14.0000 [IU] | Freq: Two times a day (BID) | SUBCUTANEOUS | Status: DC
  Administered 2017-04-25 – 2017-04-29 (×9): 14 [IU] via SUBCUTANEOUS
  Filled 2017-04-25 (×10): qty 0.14

## 2017-04-25 MED FILL — Lidocaine HCl IV Inj 20 MG/ML: INTRAVENOUS | Qty: 5 | Status: AC

## 2017-04-25 MED FILL — Gelatin Absorbable MT Powder: OROMUCOSAL | Qty: 1 | Status: AC

## 2017-04-25 MED FILL — Heparin Sodium (Porcine) Inj 1000 Unit/ML: INTRAMUSCULAR | Qty: 30 | Status: AC

## 2017-04-25 MED FILL — Electrolyte-R (PH 7.4) Solution: INTRAVENOUS | Qty: 5000 | Status: AC

## 2017-04-25 MED FILL — Sodium Chloride IV Soln 0.9%: INTRAVENOUS | Qty: 2000 | Status: AC

## 2017-04-25 MED FILL — Sodium Bicarbonate IV Soln 8.4%: INTRAVENOUS | Qty: 50 | Status: AC

## 2017-04-25 MED FILL — Heparin Sodium (Porcine) Inj 1000 Unit/ML: INTRAMUSCULAR | Qty: 60 | Status: AC

## 2017-04-25 MED FILL — Mannitol IV Soln 20%: INTRAVENOUS | Qty: 500 | Status: AC

## 2017-04-25 MED FILL — Calcium Chloride Inj 10%: INTRAVENOUS | Qty: 10 | Status: AC

## 2017-04-25 MED FILL — Potassium Chloride Inj 2 mEq/ML: INTRAVENOUS | Qty: 40 | Status: AC

## 2017-04-25 MED FILL — Magnesium Sulfate Inj 50%: INTRAMUSCULAR | Qty: 10 | Status: AC

## 2017-04-25 NOTE — Progress Notes (Signed)
Progress Note  Patient Name: Alan Diaz Date of Encounter: 04/25/2017  Primary Cardiologist: Dorris Carnes  Subjective   Patient intubated and sedated. He is awake and responsive.  Inpatient Medications    Scheduled Meds: . acetaminophen  1,000 mg Oral Q6H   Or  . acetaminophen (TYLENOL) oral liquid 160 mg/5 mL  1,000 mg Per Tube Q6H  . acetaminophen (TYLENOL) oral liquid 160 mg/5 mL  650 mg Per Tube Once   Or  . acetaminophen  650 mg Rectal Once  . bisacodyl  10 mg Oral Daily   Or  . bisacodyl  10 mg Rectal Daily  . chlorhexidine gluconate (MEDLINE KIT)  15 mL Mouth Rinse BID  . docusate sodium  200 mg Oral Daily  . insulin aspart  0-24 Units Subcutaneous Q4H  . insulin detemir  10 Units Subcutaneous BID  . mouth rinse  15 mL Mouth Rinse QID  . metoprolol tartrate  12.5 mg Oral BID   Or  . metoprolol tartrate  12.5 mg Per Tube BID  . [START ON 04/26/2017] pantoprazole  40 mg Oral Daily  . sodium chloride flush  3 mL Intravenous Q12H   Continuous Infusions: . sodium chloride 20 mL/hr (04/24/17 2000)  . sodium chloride    . sodium chloride 20 mL/hr at 04/25/17 0800  . albumin human    . amiodarone 60 mg/hr (04/25/17 0840)  . amiodarone    . cefUROXime (ZINACEF)  IV Stopped (04/25/17 0423)  . dexmedetomidine (PRECEDEX) IV infusion Stopped (04/25/17 0945)  . EPINEPHrine 4 mg in dextrose 5% 250 mL infusion (16 mcg/mL) 2 mcg/min (04/24/17 2000)  . furosemide (LASIX) infusion 8 mg/hr (04/25/17 0900)  . insulin (NOVOLIN-R) infusion 1.8 Units/hr (04/25/17 0900)  . lactated ringers    . lactated ringers 20 mL/hr at 04/24/17 1815  . lactated ringers    . milrinone 0.25 mcg/kg/min (04/25/17 0002)  . nitroGLYCERIN Stopped (04/24/17 2000)  . norepinephrine (LEVOPHED) Adult infusion 6 mcg/min (04/25/17 0915)  . phenylephrine (NEO-SYNEPHRINE) Adult infusion    . potassium chloride (KCL MULTIRUN) 30 mEq in 265 mL IVPB    . vancomycin     PRN Meds: sodium chloride, albumin  human, lactated ringers, metoprolol, midazolam, morphine injection, ondansetron (ZOFRAN) IV, oxyCODONE, sodium chloride flush, traMADol   Vital Signs    Vitals:   04/25/17 0930 04/25/17 0945 04/25/17 1000 04/25/17 1006  BP:   (!) 101/56   Pulse:    (!) 172  Resp: '14 13 12 10  '$ Temp: 99.5 F (37.5 C) 99.5 F (37.5 C) 99.3 F (37.4 C) 99.1 F (37.3 C)  TempSrc:      SpO2: 99% 100% 98% 98%  Weight:      Height:        Intake/Output Summary (Last 24 hours) at 04/25/17 1033 Last data filed at 04/25/17 1000  Gross per 24 hour  Intake          6955.38 ml  Output             3815 ml  Net          3140.38 ml   Filed Weights   04/23/17 0547 04/24/17 0444 04/25/17 0430  Weight: 186 lb 11.2 oz (84.7 kg) 185 lb 3.2 oz (84 kg) 207 lb 14.3 oz (94.3 kg)    Telemetry    NSR with some PVCs - Personally Reviewed  ECG    NSR, RBBB-new, lateral T wave inversion- Personally Reviewed  Physical Exam   GEN:  WD BM intubated on vent. Responsive.  No acute distress.   Neck: central line in place. Cardiac: RRR, no murmurs, rubs, or gallops.  Respiratory: Clear to auscultation bilaterally. GI: Soft, nontender, non-distended  Ext; IABP in right groin. No hematoma. Feet warm Neuro:  Nonfocal  Psych: Normal affect   Labs    Chemistry Recent Labs Lab 04/23/17 0423 04/24/17 0545  04/24/17 1616 04/24/17 1705 04/24/17 1837 04/25/17 0345  NA 136 136  < > 135 134* 137 136  K 3.7 3.2*  < > 4.6 4.3 4.1 3.8  CL 97* 100*  < > 100* 100*  --  108  CO2 29 26  --   --   --   --  24  GLUCOSE 161* 156*  < > 210* 208* 180* 94  BUN 10 11  < > 14 14  --  9  CREATININE 1.38* 1.27*  < > 1.10 0.90  --  1.13  CALCIUM 8.8* 8.6*  --   --   --   --  7.8*  GFRNONAA 57* >60  --   --   --   --  >60  GFRAA >60 >60  --   --   --   --  >60  ANIONGAP 10 10  --   --   --   --  4*  < > = values in this interval not displayed.   Hematology Recent Labs Lab 04/24/17 0545  04/24/17 1610  04/24/17 1834  04/24/17 1837 04/25/17 0345  WBC 6.8  --   --   --  14.5*  --  9.4  RBC 4.01*  --   --   --  3.01*  --  3.06*  HGB 12.6*  < > 9.2*  < > 9.4* 9.2* 9.7*  HCT 37.2*  < > 26.3*  < > 28.0* 27.0* 28.5*  MCV 92.8  --   --   --  93.0  --  93.1  MCH 31.4  --   --   --  31.2  --  31.7  MCHC 33.9  --   --   --  33.6  --  34.0  RDW 13.6  --   --   --  13.2  --  13.5  PLT 179  --  133*  --  136*  --  126*  < > = values in this interval not displayed.  Cardiac Enzymes Recent Labs Lab 04/22/17 1700 04/22/17 2328 04/23/17 0423  TROPONINI 4.73* 3.86* 4.04*    Recent Labs Lab 04/22/17 1220  TROPIPOC 3.12*     BNP Recent Labs Lab 04/22/17 1255  BNP 960.3*     DDimer No results for input(s): DDIMER in the last 168 hours.   Radiology    Dg Chest Port 1 View  Result Date: 04/25/2017 CLINICAL DATA:  Status post coronary bypass grafting EXAM: PORTABLE CHEST 1 VIEW COMPARISON:  04/24/2017 FINDINGS: Endotracheal tube, nasogastric catheter, left thoracostomy catheter and mediastinal drain are noted in satisfactory position. Swan-Ganz catheter is noted in the pulmonary outflow tract. An intra-aortic balloon pump is seen and stable. The lungs are well aerated bilaterally. No pneumothorax is seen. Improvement in the degree of vascular congestion is noted. IMPRESSION: Postoperative change with tubes and lines as described. Improved vascular congestion when compared with the prior exam. Electronically Signed   By: Inez Catalina M.D.   On: 04/25/2017 07:57   Dg Chest Port 1 View  Result Date: 04/24/2017 CLINICAL DATA:  Intra-aortic balloon  pump placement. Initial encounter. EXAM: PORTABLE CHEST 1 VIEW COMPARISON:  Chest radiograph performed 04/22/2017 FINDINGS: The intra-aortic balloon pump tip is noted in expected position, 2 cm above the carina. The endotracheal tube is seen ending 4-5 cm above the carina. An enteric tube is noted extending below the diaphragm. A right IJ Swan-Ganz catheter is noted  ending about the pulmonary outflow tract. A left-sided chest tube and a mediastinal drain are noted. Vascular congestion is noted. Patchy bilateral central airspace opacities raise concern for pulmonary edema. Previously noted line interstitial appearance is less prominent. No pleural effusion or pneumothorax is seen. The cardiomediastinal silhouette is borderline enlarged. No acute osseous abnormalities are identified. IMPRESSION: 1. Intra-aortic balloon pump tip noted in expected position, 2 cm above the carina. 2. Endotracheal tube seen ending 4-5 cm above the carina. 3. Vascular congestion and borderline cardiomegaly. Bilateral central airspace opacities raise concern for pulmonary edema. Electronically Signed   By: Garald Balding M.D.   On: 04/24/2017 18:35    Cardiac Studies   Echo Study Conclusions  - Left ventricle: The cavity size was moderately dilated. Wall   thickness was increased in a pattern of mild LVH. The estimated   ejection fraction was 25%. Diffuse hypokinesis. Doppler   parameters are consistent with abnormal left ventricular   relaxation (grade 1 diastolic dysfunction). - Aortic valve: There was no stenosis. - Mitral valve: There was trivial regurgitation. - Right ventricle: The cavity size was normal. Systolic function   was normal. - Pulmonary arteries: No complete TR doppler jet so unable to   estimate PA systolic pressure. - Inferior vena cava: The vessel was normal in size. The   respirophasic diameter changes were in the normal range (= 50%),   consistent with normal central venous pressure.  Impressions:  - Moderately dilated LV with mild LV hypertrophy. EF 25%, diffuse   hypokinesis. Normal RV size and systolic function. No significant   valvular abnormalities.  Procedures   IABP Insertion  Left Heart Cath and Coronary Angiography  Conclusion     LM lesion, 95 %stenosed. Ulcerated / Irregular  Ost Cx lesion, 80 %stenosed.  Mid RCA to Dist  RCA Synergy DES -- 0 %stenosed.  Ost LAD to Prox LAD Synergy DES 0 %stenosed.  ------HEMODYNAMICS-----  There is severe left ventricular systolic dysfunction. The left ventricular ejection fraction is less than 25% by visual estimate.  LV end diastolic pressure is mildly elevated.   The patient has severe distal left main disease involving the ostium of a large circumflex. Thankfully his LAD and RCA stents are both widely patent. Severe dilated ischemic cardiomyopathy with global hypokinesis but mostly prominent anterior anteroapical akinesis.  The patient was taken directly from the Cath Lab to the OR by Dr. Prescott Gum. The IABP pump was in place. He was pain-free and hemodynamically stable.  His fianc has been updated along with the primary cardiology team.      Patient Profile     52 y.o. male with past medical history of CAD (s/p DES to pLAD and dRCA in 05/2015), ischemic cardiomyopathy, chronic combined systolic and diastolic CHF (EF previously 25-30%, improved to 35-40% by echo in 08/2016), HTN, HLD, and tobacco use who presents to [facility] ED on 04/22/2017 for evaluation of chest pain and worsening dyspnea.    Assessment & Plan    1. Acute on chronic combined systolic/diastolic CHF. EF down to 20-25% due to critical left main CAD. s/p CABG emergently yesterday. Now on multiple pressors,  milrinone, IABP. Plan to wean vent and extubate first then work on weaning off IABP. Once this is done can focus on weaning pressors.   2. NSTEMI. Troponin up to 4.73. Cardiac cath yesterday showed critical left main stenosis- new since 2016. Patent stents in LAD and RCA. s/p emergent CABG.   3. HTN controlled.  4. DM type 2. on insulin drip  5. HLD. On high dose statin.  6. Noncompliance. Patient has been noncompliant with medication. Stressed importance of taking meds. He reports motivation to follow recommendations.  7. Tobacco abuse counseled on cessation.  8. CKD  creatinine improved 1.42>>1.38>> 1.27. Improved to 1.13 post op  Signed, Yoneko Talerico Martinique, MD  04/25/2017, 10:33 AM

## 2017-04-25 NOTE — Procedures (Signed)
Extubation Procedure Note  Patient Details:   Name: Alan Diaz DOB: 04/08/1965 MRN: [id number]   Airway Documentation:     Evaluation  O2 sats: stable throughout Complications: No apparent complications Patient did tolerate procedure well. Bilateral Breath Sounds: Clear, Diminished   Yes  PT was extubates to a 3L [place]  PT was able to speak  Stats are stable RT to monitor VC 9L NIF -30  Alan Diaz, Alan Diaz 04/25/2017, 10:59 AM

## 2017-04-25 NOTE — Addendum Note (Signed)
Addendum  created 04/25/17 1854 by Kipp Brood, MD   Sign clinical note

## 2017-04-25 NOTE — Care Management Note (Addendum)
Case Management Note Original Note Created by Letha Cape RN, CM   Patient Details  Name: Alan Diaz MRN: [id number] Date of Birth: 12-03-1965  Subjective/Objective:   Pt admitted NSTEMI-  Post op CABG x 3 on 4/26, for diuresis, weaned balloon pump for removal tomorrow.                Action/Plan: NCM will follow for dc needs. PTA pt lived at home-   Expected Discharge Date:                  Expected Discharge Plan:     In-House Referral:     Discharge planning Services  CM Consult  Post Acute Care Choice:    Choice offered to:     DME Arranged:    DME Agency:     HH Arranged:    HH Agency:     Status of Service:  In process, will continue to follow  If discussed at Long Length of Stay Meetings, dates discussed:    Discharge Disposition:   Additional Comments:  Leone Haven, RN 04/25/2017, 3:43 PM

## 2017-04-25 NOTE — Progress Notes (Signed)
Anesthesiology Follow-up:  Awake and alert, complaining of mild incisional pain. Extubated this morning at 11:00. Hemodynamically stable with IABP at 1:2 and milrinone at 0.25 mc/kg/min and epinephrine at 1 mcg/kg/min. Norepi weaned off.  VS: T-37.1 BP- 127/70 HR- 83 (SR) PA 30/15 CO/CI 5.8/2.8  K-4.0 BUN/Cr. 9/1.20 glucose- 172 H/H- 9.2/27 platelets- 126,000  Doing well following emergency CABG X 3 following non-Stemi with severe LV dysfunction.  Kipp Brood

## 2017-04-25 NOTE — Progress Notes (Signed)
1 Day Post-Op Procedure(s) (LRB): CORONARY ARTERY BYPASS GRAFTING (CABG) times three using left intenal mammary artery and right saphenous vein (N/A) TRANSESOPHAGEAL ECHOCARDIOGRAM (TEE) (N/A) Subjective: Recovering from emergency CABG 3 for non-ST elevation MI, congestive heart failure with cardiogenic shock and preoperative balloon pump, critical 95% left main stenosis  Patient is neuro intact and extubated Chest x-ray shows clearing of edema Patient is on low-dose epinephrine, milrinone, norepinephrine Patient placed on amiodarone for postop atrial flutter with conversion to sinus rhythm  Objective: Vital signs in last 24 hours: Temp:  [94.8 F (34.9 C)-101.3 F (38.5 C)] 98.6 F (37 C) (04/26 1500) Pulse Rate:  [78-217] 172 (04/26 1006) Cardiac Rhythm: Normal sinus rhythm (04/26 1200) Resp:  [10-30] 22 (04/26 1500) BP: (98-152)/(56-93) 123/73 (04/26 1500) SpO2:  [94 %-100 %] 97 % (04/26 1500) Arterial Line BP: (80-143)/(43-85) 113/55 (04/26 1500) FiO2 (%):  [50 %] 50 % (04/26 0800) Weight:  [207 lb 14.3 oz (94.3 kg)] 207 lb 14.3 oz (94.3 kg) (04/26 0430)  Hemodynamic parameters for last 24 hours: PAP: (20-41)/(4-20) 31/13 CO:  [4.4 L/min-7 L/min] 4.7 L/min CI:  [2.1 L/min/m2-3.4 L/min/m2] 2.3 L/min/m2  Intake/Output from previous day: 04/25 0701 - 04/26 0700 In: 6202.8 [I.V.:4637.8; Blood:285; NG/GT:130; IV Piggyback:1150] Out: 3580 [Urine:1575; Emesis/NG output:100; Blood:1625; Chest Tube:280] Intake/Output this shift: Total I/O In: 1770.6 [I.V.:1170.6; IV Piggyback:600] Out: 1305 [Urine:1285; Chest Tube:20]       Exam    General- alert and comfortable   Lungs- clear without rales, wheezes   Cor- regular rate and rhythm, no murmur , gallop   Abdomen- soft, non-tender   Extremities - warm, non-tender, minimal edema   Neuro- oriented, appropriate, no focal weakness   Lab Results:  Recent Labs  04/24/17 1834 04/24/17 1837 04/25/17 0345  WBC 14.5*  --  9.4   HGB 9.4* 9.2* 9.7*  HCT 28.0* 27.0* 28.5*  PLT 136*  --  126*   BMET:  Recent Labs  04/24/17 0545  04/24/17 1705 04/24/17 1837 04/25/17 0345  NA 136  < > 134* 137 136  K 3.2*  < > 4.3 4.1 3.8  CL 100*  < > 100*  --  108  CO2 26  --   --   --  24  GLUCOSE 156*  < > 208* 180* 94  BUN 11  < > 14  --  9  CREATININE 1.27*  < > 0.90  --  1.13  CALCIUM 8.6*  --   --   --  7.8*  < > = values in this interval not displayed.  PT/INR:  Recent Labs  04/24/17 1834  LABPROT 16.3*  INR 1.31   ABG    Component Value Date/Time   PHART 7.342 (L) 04/25/2017 1322   HCO3 22.6 04/25/2017 1322   TCO2 24 04/25/2017 1322   ACIDBASEDEF 3.0 (H) 04/25/2017 1322   O2SAT 98.0 04/25/2017 1322   CBG (last 3)   Recent Labs  04/25/17 1001 04/25/17 1048 04/25/17 1213  GLUCAP 124* 126* 151*    Assessment/Plan: S/P Procedure(s) (LRB): CORONARY ARTERY BYPASS GRAFTING (CABG) times three using left intenal mammary artery and right saphenous vein (N/A) TRANSESOPHAGEAL ECHOCARDIOGRAM (TEE) (N/A) Diuresis Diabetes control Weaned balloon pump today for removal tomorrow   LOS: 3 days    Alan Diaz 04/25/2017

## 2017-04-25 NOTE — Op Note (Signed)
NAMEUGO, THOMA Alan.:  [id number]  MEDICAL RECORD Alan.:  [id number]  LOCATION:  [id number]                        FACILITY:  [facility]  PHYSICIAN:  Kerin Perna, M.D.  DATE OF BIRTH:  01-Apr-1965  DATE OF PROCEDURE:  04/24/2017 DATE OF DISCHARGE:                              OPERATIVE REPORT   OPERATION: 1. Emergency coronary artery bypass grafting x3 (left internal mammary     artery to left anterior descending, saphenous vein graft to     circumflex marginal, saphenous vein graft to posterior descending). 2. Endoscopic harvest of right leg greater saphenous vein. 3. Preoperative balloon pump placed in cath lab for critical left main     stenosis and severe left ventricular dysfunction.  ANESTHESIA:  General by Dr. Kipp Brood.  SURGEON:  Kerin Perna, M.D.  ASSISTANT:  Rowe Clack, PA-C.  CLINICAL NOTE:  The patient is a 52 year old African American male with history of previous MI, ischemic cardiomyopathy, EF 35%, previous coronary stents in 2016 for DMI, who presented with symptoms of heart failure.  An echo showed severe LV dysfunction.  Chest x-ray showed pulmonary edema.  Creatinine was elevated at 1.6.  He was gently diuresed.  Cardiac enzymes were positive with troponin of 5.2.  Within 48 hours of admission, he underwent left heart cath, which demonstrated critical left main stenosis - 95% with ostial left circumflex stenosis, patent stents in the RCA, but with distal disease of the ostium of the posterior descending.  Ejection fraction was poor at 20%.  A balloon pump was placed and a consultation with thoracic surgery for emergency CABG was placed.  I examined the patient in the cath lab, reviewed the arteriograms with the patient's cardiologist, Dr. Bryan Lemma, and agreed with Dr. Elissa Hefty recommendation for emergency CABG.  I discussed the procedure with the patient while he was on the cath lab table, including the benefits,  alternatives, and risks.  He agreed to proceed with surgery. He understood the risks of bleeding, blood transfusion, postoperative pleural problems including pleural effusion, postoperative infection, and death.  DESCRIPTION OF OPERATION:  The patient was brought directly from the cath lab in the OR, where general anesthesia was induced and invasive monitoring lines were placed.  A transesophageal echo probe was placed by the anesthesiologist.  The patient was prepped and draped as a sterile field.  A proper time-out was performed.  A sternal incision was made as the saphenous vein was harvested endoscopically from the right leg.  The left internal mammary artery was harvested as a pedicle graft from its origin at the subclavian vessels. The left pleural space was obliterated with adhesions.  The lungs looked very severely diseased from smoking and possible cocaine use.  The vein was harvested and was of adequate quality.  The sternal retractor was placed.  The pericardium was opened.  The heart was examined.  It was very dilated and hypocontractile.  Pursestrings were placed in the right atrium and ascending aorta and heparin was administered.  When the ACT was documented as being therapeutic, the patient was cannulated and placed on cardiopulmonary bypass.  The coronaries were identified for grafting, and the mammary artery and vein  grafts were prepared for the distal anastomoses.  Cardioplegia cannulas were placed both antegrade and retrograde cold blood cardioplegia.  The patient was cooled to 32 degrees.  The aortic crossclamp was applied. Diaz liter of cold blood cardioplegia was delivered in split doses between the antegrade aortic and retrograde coronary sinus catheters.  On the preoperative TEE, there was Alan significant mitral regurgitation.  The distal coronary anastomoses were performed.  The first distal anastomosis was to the posterior descending branch of the  right coronary.  There was a 70% ostial stenosis.  A reverse saphenous vein was sewn end-to-side with running 7-0 Prolene with good flow through the graft.  Cardioplegia was redosed.  The second distal anastomosis was to the large obtuse marginal branch of the left coronary.  This had an ostial 90% stenosis.  It was 1.75 mm in diameter.  A reverse saphenous vein was sewn end-to-side with running 7- 0 Prolene with good flow through the graft.  Cardioplegia was redosed.  The third distal anastomosis was to the distal third of the LAD.  He had had a proximal 95% left main stenosis.  The left IMA pedicle was brought through an opening with a V-shaped configuration of the pericardium over the lung and down to the LAD.  An end-to-side anastomosis was configured with a running 8-0 Prolene.  There was excellent flow through the graft. Bulldog clamp was placed back on the pedicle and the pedicle was secured to the epicardium.  Cardioplegia was redosed.  The crossclamp was still in place, 2 proximal vein anastomoses were performed using running 6-0 Prolene on a 4.5-mm punch opening. Retrograde warm blood cardioplegia was to remove any residual air in the coronaries and the crossclamp was removed.  Heart resumed a spontaneous rhythm.  The vein grafts were de-aired and opened and each had good flow.  Hemostasis was documented in the proximal and distal sites.  The cardioplegia cannulas were removed. Temporary pacing wires were applied as the patient was rewarmed and reperfused.  The lungs were expanded.  The ventilator was resumed.  The patient was started on low-dose inotropic support and the balloon pump, which had been placed in the cath lab was initiated at Diaz-to-Diaz augmentation. The patient weaned off cardiopulmonary bypass without difficulty. Global LV function showed improvement with EF back to 35%.  Cardiac output was 5 L/minute.  Protamine was administered without adverse reaction.   The cannulas were removed.  Hemostasis was adequate.  The superior pericardial fat was closed over the aorta and vein grafts. Anterior mediastinal and left pleural chest tubes were placed and brought through separate incisions.  The sternum was closed with wire.  The pectoralis fascia was closed with a running #1 Vicryl.  The subcutaneous and skin layers were closed running Vicryl and sterile dressings were applied.  Total cardiopulmonary bypass time was 100 minutes.     Kerin Perna, M.D.     PV/MEDQ  D:  04/25/2017  T:  04/25/2017  Job:  [id number]  cc:   Landry Corporal, MD

## 2017-04-26 ENCOUNTER — Inpatient Hospital Stay (HOSPITAL_COMMUNITY): Payer: Medicaid Other

## 2017-04-26 DIAGNOSIS — I240 Acute coronary thrombosis not resulting in myocardial infarction: Secondary | ICD-10-CM

## 2017-04-26 DIAGNOSIS — Z951 Presence of aortocoronary bypass graft: Secondary | ICD-10-CM

## 2017-04-26 LAB — COMPREHENSIVE METABOLIC PANEL
ALT: 21 U/L (ref 17–63)
AST: 25 U/L (ref 15–41)
Albumin: 3.3 g/dL — ABNORMAL LOW (ref 3.5–5.0)
Alkaline Phosphatase: 45 U/L (ref 38–126)
Anion gap: 10 (ref 5–15)
BUN: 10 mg/dL (ref 6–20)
CO2: 26 mmol/L (ref 22–32)
Calcium: 8.2 mg/dL — ABNORMAL LOW (ref 8.9–10.3)
Chloride: 97 mmol/L — ABNORMAL LOW (ref 101–111)
Creatinine, Ser: 1.44 mg/dL — ABNORMAL HIGH (ref 0.61–1.24)
GFR calc Af Amer: >60 mL/min (ref >60)
GFR calc non Af Amer: 54 mL/min — ABNORMAL LOW (ref >60)
Glucose, Bld: 178 mg/dL — ABNORMAL HIGH (ref 65–99)
Potassium: 4.1 mmol/L (ref 3.5–5.1)
Sodium: 133 mmol/L — ABNORMAL LOW (ref 135–145)
Total Bilirubin: 0.5 mg/dL (ref 0.3–1.2)
Total Protein: 6.2 g/dL — ABNORMAL LOW (ref 6.5–8.1)

## 2017-04-26 LAB — BLOOD GAS, ARTERIAL
Acid-Base Excess: 0.6 mmol/L (ref 0.0–2.0)
Bicarbonate: 25.4 mmol/L (ref 20.0–28.0)
O2 Content: 2 L/min
O2 Saturation: 95.5 %
Patient temperature: 98.6
pCO2 arterial: 46.4 mmHg (ref 32.0–48.0)
pH, Arterial: 7.358 (ref 7.350–7.450)
pO2, Arterial: 82.2 mmHg — ABNORMAL LOW (ref 83.0–108.0)

## 2017-04-26 LAB — COOXEMETRY PANEL
Carboxyhemoglobin: 1 % (ref 0.5–1.5)
Methemoglobin: 1.4 % (ref 0.0–1.5)
O2 Saturation: 56.6 %
Total hemoglobin: 9.9 g/dL — ABNORMAL LOW (ref 12.0–16.0)

## 2017-04-26 LAB — CBC
HCT: 29.1 % — ABNORMAL LOW (ref 39.0–52.0)
Hemoglobin: 9.9 g/dL — ABNORMAL LOW (ref 13.0–17.0)
MCH: 31.9 pg (ref 26.0–34.0)
MCHC: 34 g/dL (ref 30.0–36.0)
MCV: 93.9 fL (ref 78.0–100.0)
Platelets: 137 10*3/uL — ABNORMAL LOW (ref 150–400)
RBC: 3.1 MIL/uL — ABNORMAL LOW (ref 4.22–5.81)
RDW: 13.9 % (ref 11.5–15.5)
WBC: 14.7 10*3/uL — ABNORMAL HIGH (ref 4.0–10.5)

## 2017-04-26 LAB — GLUCOSE, CAPILLARY
Glucose-Capillary: 181 mg/dL — ABNORMAL HIGH (ref 65–99)
Glucose-Capillary: 151 mg/dL — ABNORMAL HIGH (ref 65–99)
Glucose-Capillary: 150 mg/dL — ABNORMAL HIGH (ref 65–99)
Glucose-Capillary: 203 mg/dL — ABNORMAL HIGH (ref 65–99)
Glucose-Capillary: 269 mg/dL — ABNORMAL HIGH (ref 65–99)

## 2017-04-26 LAB — APTT: aPTT: 38 seconds — ABNORMAL HIGH (ref 24–36)

## 2017-04-26 LAB — POCT ACTIVATED CLOTTING TIME: Activated Clotting Time: 136 seconds

## 2017-04-26 LAB — PROTIME-INR
INR: 1.19
Prothrombin Time: 15.2 seconds (ref 11.4–15.2)

## 2017-04-26 MED ORDER — ATROPINE SULFATE 1 MG/10ML IJ SOSY
PREFILLED_SYRINGE | INTRAMUSCULAR | Status: AC
  Filled 2017-04-26: qty 10

## 2017-04-26 MED ORDER — ATORVASTATIN CALCIUM 40 MG PO TABS
40.0000 mg | ORAL_TABLET | Freq: Every day | ORAL | Status: DC
  Administered 2017-04-26 – 2017-04-30 (×5): 40 mg via ORAL
  Filled 2017-04-26 (×5): qty 1

## 2017-04-26 MED ORDER — FENTANYL CITRATE (PF) 100 MCG/2ML IJ SOLN
50.0000 ug | INTRAMUSCULAR | Status: DC | PRN
  Administered 2017-04-26 – 2017-04-29 (×16): 50 ug via INTRAVENOUS
  Filled 2017-04-26 (×17): qty 2

## 2017-04-26 MED ORDER — AMIODARONE HCL 200 MG PO TABS
400.0000 mg | ORAL_TABLET | Freq: Two times a day (BID) | ORAL | Status: DC
  Administered 2017-04-26 – 2017-05-01 (×11): 400 mg via ORAL
  Filled 2017-04-26 (×11): qty 2

## 2017-04-26 MED ORDER — FENTANYL 25 MCG/HR TD PT72
75.0000 ug | MEDICATED_PATCH | TRANSDERMAL | Status: DC
  Administered 2017-04-26: 75 ug via TRANSDERMAL
  Filled 2017-04-26: qty 3

## 2017-04-26 NOTE — Progress Notes (Signed)
Site area: Rt fem art balloon pump/sheath Site Prior to Removal:  Level 0 Pressure Applied For: Manual:   yes Patient Status During Pull:  A/O Post Pull Site:  Level 0 Post Pull Instructions Given:  Yes and pt understands instructions Post Pull Pulses Present: 2+ rt dp/pt Dressing Applied:  Bedrest begins @ 10:10:00 Comments: Pt nurse Shannon in to check rt groin. Level 0.

## 2017-04-26 NOTE — Progress Notes (Signed)
Patient ID: Alan Diaz, male   DOB: 05-24-1965, 52 y.o.   MRN: [id number]  SICU Evening Rounds:  Hemodynamically stable on Milrinone 0.25.  IABP removed earlier today.   Good diuresis with lasix drip.  Possibly remove chest tubes this evening after dangle.

## 2017-04-26 NOTE — Progress Notes (Signed)
2 Days Post-Op Procedure(s) (LRB): CORONARY ARTERY BYPASS GRAFTING (CABG) times three using left intenal mammary artery and right saphenous vein (N/A) TRANSESOPHAGEAL ECHOCARDIOGRAM (TEE) (N/A) Subjective: emerg CABG for non stemi, EF .20, preop IABP Extubated, creat 1.4, good u/o on lasix drip IABP down to 1:3 with stable hemodynamics- will DC  Min chest tube drainage Objective: Vital signs in last 24 hours: Temp:  [97.9 F (36.6 C)-99.1 F (37.3 C)] 97.9 F (36.6 C) (04/27 1300) Pulse Rate:  [90-216] 90 (04/27 1015) Cardiac Rhythm: Normal sinus rhythm (04/27 0800) Resp:  [12-32] 23 (04/27 1300) BP: (103-148)/(69-93) 135/87 (04/27 1300) SpO2:  [93 %-97 %] 95 % (04/27 1300) Arterial Line BP: (120-150)/(65-83) 144/71 (04/27 1300) Weight:  [202 lb 2.6 oz (91.7 kg)] 202 lb 2.6 oz (91.7 kg) (04/27 0600)  Hemodynamic parameters for last 24 hours: PAP: (27-40)/(11-22) 29/11 CO:  [4 L/min-6.3 L/min] 5.1 L/min CI:  [1.9 L/min/m2-3.1 L/min/m2] 2.5 L/min/m2  Intake/Output from previous day: 04/26 0701 - 04/27 0700 In: 2985.7 [P.O.:120; I.V.:2165.7; IV Piggyback:700] Out: 4640 [Urine:4460; Chest Tube:180] Intake/Output this shift: Total I/O In: 589.3 [P.O.:300; I.V.:289.3] Out: 915 [Urine:855; Chest Tube:60]       Exam    General- alert and comfortable   Lungs- clear without rales, wheezes   Cor- regular rate and rhythm, no murmur , gallop   Abdomen- soft, non-tender   Extremities - warm, non-tender, minimal edema   Neuro- oriented, appropriate, no focal weakness   Lab Results:  Recent Labs  04/25/17 2050 04/26/17 0305  WBC 14.5* 14.7*  HGB 9.5* 9.9*  HCT 28.9* 29.1*  PLT 133* 137*   BMET:  Recent Labs  04/25/17 0345 04/25/17 1610 04/25/17 2050 04/26/17 0305  NA 136 135  --  133*  K 3.8 4.0  --  4.1  CL 108 103  --  97*  CO2 24  --   --  26  GLUCOSE 94 172*  --  178*  BUN 9 9  --  10  CREATININE 1.13 1.20 1.38* 1.44*  CALCIUM 7.8*  --   --  8.2*     PT/INR:  Recent Labs  04/26/17 0305  LABPROT 15.2  INR 1.19   ABG    Component Value Date/Time   PHART 7.358 04/26/2017 0438   HCO3 25.4 04/26/2017 0438   TCO2 21 04/25/2017 1610   ACIDBASEDEF 5.0 (H) 04/25/2017 1607   O2SAT 95.5 04/26/2017 0438   CBG (last 3)   Recent Labs  04/26/17 0304 04/26/17 0819 04/26/17 1129  GLUCAP 181* 151* 150*    Assessment/Plan: S/P Procedure(s) (LRB): CORONARY ARTERY BYPASS GRAFTING (CABG) times three using left intenal mammary artery and right saphenous vein (N/A) TRANSESOPHAGEAL ECHOCARDIOGRAM (TEE) (N/A) Mobilize Diuresis Diabetes control d/c tubes/lines hold preop plavix Start lipitor  LOS: 4 days    Alan Diaz 04/26/2017

## 2017-04-26 NOTE — Progress Notes (Signed)
Progress Note  Patient Name: Alan Diaz Date of Encounter: 04/26/2017  Primary Cardiologist: Dorris Carnes  Subjective    He is awake and responsive. Feels much better today.  Inpatient Medications    Scheduled Meds: . acetaminophen  1,000 mg Oral Q6H   Or  . acetaminophen (TYLENOL) oral liquid 160 mg/5 mL  1,000 mg Per Tube Q6H  . amiodarone  400 mg Oral BID  . atorvastatin  40 mg Oral q1800  . atropine      . bisacodyl  10 mg Oral Daily   Or  . bisacodyl  10 mg Rectal Daily  . chlorhexidine gluconate (MEDLINE KIT)  15 mL Mouth Rinse BID  . docusate sodium  200 mg Oral Daily  . fentaNYL  75 mcg Transdermal Q72H  . insulin aspart  0-24 Units Subcutaneous Q4H  . insulin detemir  14 Units Subcutaneous BID  . mouth rinse  15 mL Mouth Rinse QID  . metoprolol tartrate  12.5 mg Oral BID   Or  . metoprolol tartrate  12.5 mg Per Tube BID  . pantoprazole  40 mg Oral Daily  . sodium chloride flush  3 mL Intravenous Q12H   Continuous Infusions: . sodium chloride Stopped (04/26/17 1600)  . sodium chloride    . sodium chloride 10 mL/hr at 04/25/17 2000  . dexmedetomidine (PRECEDEX) IV infusion Stopped (04/25/17 0945)  . EPINEPHrine 4 mg in dextrose 5% 250 mL infusion (16 mcg/mL) Stopped (04/25/17 1945)  . furosemide (LASIX) infusion 8 mg/hr (04/26/17 1539)  . lactated ringers    . lactated ringers 10 mL/hr at 04/25/17 2000  . lactated ringers    . milrinone 0.25 mcg/kg/min (04/26/17 0826)  . nitroGLYCERIN Stopped (04/24/17 2000)  . norepinephrine (LEVOPHED) Adult infusion Stopped (04/25/17 1730)  . phenylephrine (NEO-SYNEPHRINE) Adult infusion    . potassium chloride (KCL MULTIRUN) 30 mEq in 265 mL IVPB     PRN Meds: sodium chloride, fentaNYL (SUBLIMAZE) injection, lactated ringers, metoprolol, midazolam, ondansetron (ZOFRAN) IV, oxyCODONE, sodium chloride flush, traMADol   Vital Signs    Vitals:   04/26/17 1300 04/26/17 1400 04/26/17 1500 04/26/17 1600  BP: 135/87  133/87 111/84 118/79  Pulse:      Resp: (!) 23 20 (!) 23 18  Temp: 97.9 F (36.6 C) 97.9 F (36.6 C) 98.2 F (36.8 C)   TempSrc:   Oral   SpO2: 95% 95% (!) 89% 92%  Weight:      Height:        Intake/Output Summary (Last 24 hours) at 04/26/17 1614 Last data filed at 04/26/17 1600  Gross per 24 hour  Intake           2033.8 ml  Output             4330 ml  Net          -2296.2 ml   Filed Weights   04/24/17 0444 04/25/17 0430 04/26/17 0600  Weight: 185 lb 3.2 oz (84 kg) 207 lb 14.3 oz (94.3 kg) 202 lb 2.6 oz (91.7 kg)    Telemetry    NSR with some PVCs - Personally Reviewed  ECG    NSR, RBBB-new, lateral T wave inversion- Personally Reviewed  Physical Exam   GEN: WD BM awake, alert,  No acute distress.   Neck: central line in place. Cardiac: RRR, no murmurs, rubs, or gallops.  Respiratory: Clear to auscultation bilaterally. GI: Soft, nontender, non-distended  Ext; IABP is now out.  No hematoma. Feet warm  Neuro:  Nonfocal  Psych: Normal affect   Labs    Chemistry Recent Labs Lab 04/24/17 0545  04/25/17 0345 04/25/17 1610 04/25/17 2050 04/26/17 0305  NA 136  < > 136 135  --  133*  K 3.2*  < > 3.8 4.0  --  4.1  CL 100*  < > 108 103  --  97*  CO2 26  --  24  --   --  26  GLUCOSE 156*  < > 94 172*  --  178*  BUN 11  < > 9 9  --  10  CREATININE 1.27*  < > 1.13 1.20 1.38* 1.44*  CALCIUM 8.6*  --  7.8*  --   --  8.2*  PROT  --   --   --   --   --  6.2*  ALBUMIN  --   --   --   --   --  3.3*  AST  --   --   --   --   --  25  ALT  --   --   --   --   --  21  ALKPHOS  --   --   --   --   --  45  BILITOT  --   --   --   --   --  0.5  GFRNONAA >60  --  >60  --  57* 54*  GFRAA >60  --  >60  --  >60 >60  ANIONGAP 10  --  4*  --   --  10  < > = values in this interval not displayed.   Hematology  Recent Labs Lab 04/25/17 0345 04/25/17 1610 04/25/17 2050 04/26/17 0305  WBC 9.4  --  14.5* 14.7*  RBC 3.06*  --  3.07* 3.10*  HGB 9.7* 9.2* 9.5* 9.9*  HCT  28.5* 27.0* 28.9* 29.1*  MCV 93.1  --  94.1 93.9  MCH 31.7  --  30.9 31.9  MCHC 34.0  --  32.9 34.0  RDW 13.5  --  13.6 13.9  PLT 126*  --  133* 137*    Cardiac Enzymes  Recent Labs Lab 04/22/17 1700 04/22/17 2328 04/23/17 0423  TROPONINI 4.73* 3.86* 4.04*     Recent Labs Lab 04/22/17 1220  TROPIPOC 3.12*     BNP  Recent Labs Lab 04/22/17 1255  BNP 960.3*     DDimer No results for input(s): DDIMER in the last 168 hours.   Radiology    Dg Chest Port 1 View  Result Date: 04/26/2017 CLINICAL DATA:  Chest tube. EXAM: PORTABLE CHEST 1 VIEW COMPARISON:  04/25/2017.  04/24/2017 . FINDINGS: Interim extubation and removal of NG tube. Intra-aortic balloon pump in stable position . Swan-Ganz catheter, mediastinal drainage catheter, left chest tube in stable position. Prior CABG. Stable cardiomegaly. New onset of bilateral pulmonary infiltrates/edema. Low lung volumes . No prominent pleural effusion. No pneumothorax . IMPRESSION: 1. Interim extubation and removal of NG tube. Remaining lines and tubes including left chest tube and intra-aortic balloon pump in stable position. 2. Prior CABG. New onset of congestive heart failure bilateral pulmonary edema. Low lung volumes. Electronically Signed   By: Maisie Fus  Register   On: 04/26/2017 07:57   Dg Chest Port 1 View  Result Date: 04/25/2017 CLINICAL DATA:  Status post coronary bypass grafting EXAM: PORTABLE CHEST 1 VIEW COMPARISON:  04/24/2017 FINDINGS: Endotracheal tube, nasogastric catheter, left thoracostomy catheter and mediastinal drain are noted in satisfactory position.  Swan-Ganz catheter is noted in the pulmonary outflow tract. An intra-aortic balloon pump is seen and stable. The lungs are well aerated bilaterally. No pneumothorax is seen. Improvement in the degree of vascular congestion is noted. IMPRESSION: Postoperative change with tubes and lines as described. Improved vascular congestion when compared with the prior exam.  Electronically Signed   By: Alcide Clever M.D.   On: 04/25/2017 07:57   Dg Chest Port 1 View  Result Date: 04/24/2017 CLINICAL DATA:  Intra-aortic balloon pump placement. Initial encounter. EXAM: PORTABLE CHEST 1 VIEW COMPARISON:  Chest radiograph performed 04/22/2017 FINDINGS: The intra-aortic balloon pump tip is noted in expected position, 2 cm above the carina. The endotracheal tube is seen ending 4-5 cm above the carina. An enteric tube is noted extending below the diaphragm. A right IJ Swan-Ganz catheter is noted ending about the pulmonary outflow tract. A left-sided chest tube and a mediastinal drain are noted. Vascular congestion is noted. Patchy bilateral central airspace opacities raise concern for pulmonary edema. Previously noted line interstitial appearance is less prominent. No pleural effusion or pneumothorax is seen. The cardiomediastinal silhouette is borderline enlarged. No acute osseous abnormalities are identified. IMPRESSION: 1. Intra-aortic balloon pump tip noted in expected position, 2 cm above the carina. 2. Endotracheal tube seen ending 4-5 cm above the carina. 3. Vascular congestion and borderline cardiomegaly. Bilateral central airspace opacities raise concern for pulmonary edema. Electronically Signed   By: Roanna Raider M.D.   On: 04/24/2017 18:35    Cardiac Studies   Echo Study Conclusions  - Left ventricle: The cavity size was moderately dilated. Wall   thickness was increased in a pattern of mild LVH. The estimated   ejection fraction was 25%. Diffuse hypokinesis. Doppler   parameters are consistent with abnormal left ventricular   relaxation (grade 1 diastolic dysfunction). - Aortic valve: There was no stenosis. - Mitral valve: There was trivial regurgitation. - Right ventricle: The cavity size was normal. Systolic function   was normal. - Pulmonary arteries: No complete TR doppler jet so unable to   estimate PA systolic pressure. - Inferior vena cava: The  vessel was normal in size. The   respirophasic diameter changes were in the normal range (= 50%),   consistent with normal central venous pressure.  Impressions:  - Moderately dilated LV with mild LV hypertrophy. EF 25%, diffuse   hypokinesis. Normal RV size and systolic function. No significant   valvular abnormalities.  Procedures   IABP Insertion  Left Heart Cath and Coronary Angiography  Conclusion     LM lesion, 95 %stenosed. Ulcerated / Irregular  Ost Cx lesion, 80 %stenosed.  Mid RCA to Dist RCA Synergy DES -- 0 %stenosed.  Ost LAD to Prox LAD Synergy DES 0 %stenosed.  ------HEMODYNAMICS-----  There is severe left ventricular systolic dysfunction. The left ventricular ejection fraction is less than 25% by visual estimate.  LV end diastolic pressure is mildly elevated.   The patient has severe distal left main disease involving the ostium of a large circumflex. Thankfully his LAD and RCA stents are both widely patent. Severe dilated ischemic cardiomyopathy with global hypokinesis but mostly prominent anterior anteroapical akinesis.  The patient was taken directly from the Cath Lab to the OR by Dr. Donata Clay. The IABP pump was in place. He was pain-free and hemodynamically stable.  His fianc has been updated along with the primary cardiology team.      Patient Profile     52 y.o. male with past medical  history of CAD (s/p DES to pLAD and dRCA in 05/2015), ischemic cardiomyopathy, chronic combined systolic and diastolic CHF (EF previously 25-30%, improved to 35-40% by echo in 08/2016), HTN, HLD, and tobacco use who presents to [facility] ED on 04/22/2017 for evaluation of chest pain and worsening dyspnea.    Assessment & Plan    1. Acute on chronic combined systolic/diastolic CHF. EF down to 20-25% due to critical left main CAD. s/p CABG emergently 04/24/17. Now off all pressors except milrinone. IABP is out. Still volume overloaded. On continuous IV lasix  drip with good diuresis. PAP look good. As hemodynamic improve will need to transition to oral therapy with Coreg, ACEi.   2. NSTEMI. Troponin up to 4.73. Cardiac cath showed critical left main stenosis- new since 2016. Patent stents in LAD and RCA. s/p emergent CABG. Would consider resumption of Plavix at discharge.   3. HTN controlled.  4. DM type 2. on insulin drip  5. HLD. On high dose statin.  6. Noncompliance. Patient has been noncompliant with medication. Stressed importance of taking meds. He reports motivation to follow recommendations.  7. Tobacco abuse counseled on cessation.  8. CKD creatinine improved 1.42>>1.38>> 1.27. Up to 1.44 post op. Monitor with diuresis.  Signed, Truxton Stupka [place], MD  04/26/2017, 4:14 PM

## 2017-04-27 ENCOUNTER — Inpatient Hospital Stay (HOSPITAL_COMMUNITY): Payer: Medicaid Other

## 2017-04-27 LAB — POCT I-STAT, CHEM 8
BUN: 13 mg/dL (ref 6–20)
BUN: 15 mg/dL (ref 6–20)
Calcium, Ion: 1.1 mmol/L — ABNORMAL LOW (ref 1.15–1.40)
Calcium, Ion: 1.16 mmol/L (ref 1.15–1.40)
Chloride: 92 mmol/L — ABNORMAL LOW (ref 101–111)
Chloride: 92 mmol/L — ABNORMAL LOW (ref 101–111)
Creatinine, Ser: 1.4 mg/dL — ABNORMAL HIGH (ref 0.61–1.24)
Creatinine, Ser: 1.4 mg/dL — ABNORMAL HIGH (ref 0.61–1.24)
Glucose, Bld: 201 mg/dL — ABNORMAL HIGH (ref 65–99)
Glucose, Bld: 203 mg/dL — ABNORMAL HIGH (ref 65–99)
HCT: 30 % — ABNORMAL LOW (ref 39.0–52.0)
HCT: 29 % — ABNORMAL LOW (ref 39.0–52.0)
Hemoglobin: 10.2 g/dL — ABNORMAL LOW (ref 13.0–17.0)
Hemoglobin: 9.9 g/dL — ABNORMAL LOW (ref 13.0–17.0)
Potassium: 3.9 mmol/L (ref 3.5–5.1)
Potassium: 3.3 mmol/L — ABNORMAL LOW (ref 3.5–5.1)
Sodium: 134 mmol/L — ABNORMAL LOW (ref 135–145)
Sodium: 132 mmol/L — ABNORMAL LOW (ref 135–145)
TCO2: 29 mmol/L (ref 0–100)
TCO2: 31 mmol/L (ref 0–100)

## 2017-04-27 LAB — CBC
HCT: 29.6 % — ABNORMAL LOW (ref 39.0–52.0)
Hemoglobin: 9.8 g/dL — ABNORMAL LOW (ref 13.0–17.0)
MCH: 30.9 pg (ref 26.0–34.0)
MCHC: 33.1 g/dL (ref 30.0–36.0)
MCV: 93.4 fL (ref 78.0–100.0)
Platelets: 150 10*3/uL (ref 150–400)
RBC: 3.17 MIL/uL — ABNORMAL LOW (ref 4.22–5.81)
RDW: 13.3 % (ref 11.5–15.5)
WBC: 14 10*3/uL — ABNORMAL HIGH (ref 4.0–10.5)

## 2017-04-27 LAB — COMPREHENSIVE METABOLIC PANEL WITH GFR
ALT: 19 U/L (ref 17–63)
AST: 19 U/L (ref 15–41)
Albumin: 2.8 g/dL — ABNORMAL LOW (ref 3.5–5.0)
Alkaline Phosphatase: 49 U/L (ref 38–126)
Anion gap: 9 (ref 5–15)
BUN: 11 mg/dL (ref 6–20)
CO2: 31 mmol/L (ref 22–32)
Calcium: 8.1 mg/dL — ABNORMAL LOW (ref 8.9–10.3)
Chloride: 95 mmol/L — ABNORMAL LOW (ref 101–111)
Creatinine, Ser: 1.33 mg/dL — ABNORMAL HIGH (ref 0.61–1.24)
GFR calc Af Amer: >60 mL/min
GFR calc non Af Amer: 60 mL/min — ABNORMAL LOW
Glucose, Bld: 170 mg/dL — ABNORMAL HIGH (ref 65–99)
Potassium: 3.5 mmol/L (ref 3.5–5.1)
Sodium: 135 mmol/L (ref 135–145)
Total Bilirubin: 0.5 mg/dL (ref 0.3–1.2)
Total Protein: 5.7 g/dL — ABNORMAL LOW (ref 6.5–8.1)

## 2017-04-27 LAB — GLUCOSE, CAPILLARY
Glucose-Capillary: 142 mg/dL — ABNORMAL HIGH (ref 65–99)
Glucose-Capillary: 162 mg/dL — ABNORMAL HIGH (ref 65–99)
Glucose-Capillary: 167 mg/dL — ABNORMAL HIGH (ref 65–99)
Glucose-Capillary: 188 mg/dL — ABNORMAL HIGH (ref 65–99)
Glucose-Capillary: 215 mg/dL — ABNORMAL HIGH (ref 65–99)
Glucose-Capillary: 222 mg/dL — ABNORMAL HIGH (ref 65–99)
Glucose-Capillary: 245 mg/dL — ABNORMAL HIGH (ref 65–99)

## 2017-04-27 LAB — COOXEMETRY PANEL
Carboxyhemoglobin: 1.4 % (ref 0.5–1.5)
Methemoglobin: 0.8 % (ref 0.0–1.5)
O2 Saturation: 63.2 %
Total hemoglobin: 12.9 g/dL (ref 12.0–16.0)

## 2017-04-27 LAB — CULTURE, RESPIRATORY W GRAM STAIN
Culture: NORMAL
Special Requests: NORMAL

## 2017-04-27 LAB — MAGNESIUM: Magnesium: 1.7 mg/dL (ref 1.7–2.4)

## 2017-04-27 MED ORDER — SODIUM CHLORIDE 0.9 % IV SOLN
30.0000 meq | Freq: Once | INTRAVENOUS | Status: AC
  Administered 2017-04-27: 30 meq via INTRAVENOUS
  Filled 2017-04-27: qty 15

## 2017-04-27 MED ORDER — FUROSEMIDE 10 MG/ML IJ SOLN
40.0000 mg | Freq: Two times a day (BID) | INTRAMUSCULAR | Status: DC
  Administered 2017-04-27 – 2017-04-28 (×3): 40 mg via INTRAVENOUS
  Filled 2017-04-27 (×3): qty 4

## 2017-04-27 MED ORDER — CHLORHEXIDINE GLUCONATE CLOTH 2 % EX PADS
6.0000 | MEDICATED_PAD | Freq: Every day | CUTANEOUS | Status: DC
  Administered 2017-04-27 – 2017-04-29 (×3): 6 via TOPICAL

## 2017-04-27 MED ORDER — SODIUM CHLORIDE 0.9% FLUSH: 10.0000 mL | INTRAVENOUS | Status: DC | PRN

## 2017-04-27 MED ORDER — POTASSIUM CHLORIDE CRYS ER 20 MEQ PO TBCR
20.0000 meq | EXTENDED_RELEASE_TABLET | Freq: Two times a day (BID) | ORAL | Status: DC
  Administered 2017-04-27 – 2017-04-28 (×3): 20 meq via ORAL
  Filled 2017-04-27 (×3): qty 1

## 2017-04-27 MED ORDER — SODIUM CHLORIDE 0.9% FLUSH
10.0000 mL | Freq: Two times a day (BID) | INTRAVENOUS | Status: DC
  Administered 2017-04-27 – 2017-04-30 (×6): 10 mL

## 2017-04-27 NOTE — Progress Notes (Signed)
Patient ID: Alan Diaz, male   DOB: 1965-06-02, 52 y.o.   MRN: [id number]  SICU Evening Rounds:  Hemodynamically stable  Milrinone .125 via PICC  Urine output good  sats 90-95%

## 2017-04-27 NOTE — Progress Notes (Signed)
Peripherally Inserted Central Catheter/Midline Placement  The IV Nurse has discussed with the patient and/or persons authorized to consent for the patient, the purpose of this procedure and the potential benefits and risks involved with this procedure.  The benefits include less needle sticks, lab draws from the catheter, and the patient may be discharged home with the catheter. Risks include, but not limited to, infection, bleeding, blood clot (thrombus formation), and puncture of an artery; nerve damage and irregular heartbeat and possibility to perform a PICC exchange if needed/ordered by physician.  Alternatives to this procedure were also discussed.  Bard Power PICC patient education guide, fact sheet on infection prevention and patient information card has been provided to patient /or left at bedside.    PICC/Midline Placement Documentation  PICC Double Lumen 04/27/17 PICC Right Brachial 42 cm 3 cm (Active)  Indication for Insertion or Continuance of Line Vasoactive infusions 04/27/2017 11:35 AM  Exposed Catheter (cm) 3 cm 04/27/2017 11:35 AM  Site Assessment Clean;Dry;Intact 04/27/2017 11:35 AM  Lumen #1 Status Flushed;Saline locked;Blood return noted 04/27/2017 11:35 AM  Lumen #2 Status Flushed;Saline locked;Blood return noted 04/27/2017 11:35 AM  Dressing Type Transparent;Securing device 04/27/2017 11:35 AM  Dressing Status Clean;Dry;Intact;Antimicrobial disc in place 04/27/2017 11:35 AM  Line Care Connections checked and tightened 04/27/2017 11:35 AM  Line Adjustment (NICU/IV Team Only) No 04/27/2017 11:35 AM  Dressing Intervention New dressing 04/27/2017 11:35 AM  Dressing Change Due 05/04/17 04/27/2017 11:35 AM       Smiley Birr, Terrilee Files 04/27/2017, 11:38 AM

## 2017-04-27 NOTE — Progress Notes (Addendum)
3 Days Post-Op Procedure(s) (LRB): CORONARY ARTERY BYPASS GRAFTING (CABG) times three using left intenal mammary artery and right saphenous vein (N/A) TRANSESOPHAGEAL ECHOCARDIOGRAM (TEE) (N/A) Subjective:  No complaints. Ambulated around ICU.  Getting ready to have PICC inserted.  Objective: Vital signs in last 24 hours: Temp:  [97.9 F (36.6 C)-99 F (37.2 C)] 99 F (37.2 C) (04/28 0807) Pulse Rate:  [88-96] 96 (04/28 0937) Cardiac Rhythm: Normal sinus rhythm (04/28 0800) Resp:  [11-31] 28 (04/28 1000) BP: (103-142)/(62-95) 118/79 (04/28 1000) SpO2:  [89 %-100 %] 93 % (04/28 0900) Arterial Line BP: (116-150)/(64-75) 116/64 (04/27 1500) Weight:  [89.5 kg (197 lb 5 oz)] 89.5 kg (197 lb 5 oz) (04/28 0700)  Hemodynamic parameters for last 24 hours: PAP: (28-37)/(10-15) 32/10 CO:  [5.1 L/min] 5.1 L/min CI:  [2.5 L/min/m2] 2.5 L/min/m2  Intake/Output from previous day: 04/27 0701 - 04/28 0700 In: 1586.7 [P.O.:780; I.V.:756.7; IV Piggyback:50] Out: 4010 [Urine:3890; Chest Tube:120] Intake/Output this shift: Total I/O In: 387.9 [P.O.:50; I.V.:72.9; IV Piggyback:265] Out: 230 [Urine:230]  General appearance: alert and cooperative Neurologic: intact Heart: regular rate and rhythm, S1, S2 normal, no murmur, click, rub or gallop Lungs: clear to auscultation bilaterally Extremities: extremities normal, atraumatic, no cyanosis or edema Wound: incision ok  Lab Results:  Recent Labs  04/26/17 0305  04/27/17 0119 04/27/17 0515  WBC 14.7*  --   --  14.0*  HGB 9.9*  < > 9.9* 9.8*  HCT 29.1*  < > 29.0* 29.6*  PLT 137*  --   --  150  < > = values in this interval not displayed. BMET:  Recent Labs  04/26/17 0305  04/27/17 0119 04/27/17 0515  NA 133*  < > 132* 135  K 4.1  < > 3.3* 3.5  CL 97*  < > 92* 95*  CO2 26  --   --  31  GLUCOSE 178*  < > 203* 170*  BUN 10  < > 15 11  CREATININE 1.44*  < > 1.40* 1.33*  CALCIUM 8.2*  --   --  8.1*  < > = values in this interval  not displayed.  PT/INR:  Recent Labs  04/26/17 0305  LABPROT 15.2  INR 1.19   ABG    Component Value Date/Time   PHART 7.358 04/26/2017 0438   HCO3 25.4 04/26/2017 0438   TCO2 31 04/27/2017 0119   ACIDBASEDEF 5.0 (H) 04/25/2017 1607   O2SAT 63.2 04/27/2017 0525   CBG (last 3)   Recent Labs  04/26/17 2323 04/27/17 0339 04/27/17 0803  GLUCAP 215* 162* 142*   CLINICAL DATA:  Coronary artery disease.  EXAM: PORTABLE CHEST 1 VIEW  COMPARISON:  04/26/2017.  FINDINGS: The patient remains extubated with removal of orogastric tube. Intra-aortic balloon pump is no longer visualized. LEFT chest tube removed. Mediastinal tube removed. Swan-Ganz catheter removed. No pneumothorax. Low lung volumes with BILATERAL atelectasis and effusions, stable. Retrocardiac density remain stable.  IMPRESSION: Support apparatus removal is described. No pneumothorax. Stable aeration.   Electronically Signed   By: Elsie Stain M.D.   On: 04/27/2017 08:21   Assessment/Plan: S/P Procedure(s) (LRB): CORONARY ARTERY BYPASS GRAFTING (CABG) times three using left intenal mammary artery and right saphenous vein (N/A) TRANSESOPHAGEAL ECHOCARDIOGRAM (TEE) (N/A)  He is hemodynamically stable on Milrinone 0.25 with Co-ox 63%. Will decrease to 0.125. Hold BB until off Milrinone.  He diuresed yesterday and wt down 5 lbs. Still 12 lbs over preop. Will stop lasix drip and put on bid IV lasix.  Diabetes: glucose under adequate control on current Levemir and SSI.  Continue IS, ambulation.   LOS: 5 days    Alleen Borne 04/27/2017

## 2017-04-28 LAB — TYPE AND SCREEN
ABO/RH(D): O POS
Antibody Screen: NEGATIVE
Unit division: 0
Unit division: 0
Unit division: 0
Unit division: 0
Unit division: 0
Unit division: 0
Unit division: 0
Unit division: 0

## 2017-04-28 LAB — GLUCOSE, CAPILLARY
Glucose-Capillary: 113 mg/dL — ABNORMAL HIGH (ref 65–99)
Glucose-Capillary: 136 mg/dL — ABNORMAL HIGH (ref 65–99)
Glucose-Capillary: 153 mg/dL — ABNORMAL HIGH (ref 65–99)
Glucose-Capillary: 227 mg/dL — ABNORMAL HIGH (ref 65–99)
Glucose-Capillary: 260 mg/dL — ABNORMAL HIGH (ref 65–99)
Glucose-Capillary: 353 mg/dL — ABNORMAL HIGH (ref 65–99)

## 2017-04-28 LAB — BPAM RBC
Blood Product Expiration Date: 201805182359
Blood Product Expiration Date: 201805192359
Blood Product Expiration Date: 201805192359
Blood Product Expiration Date: 201805192359
Blood Product Expiration Date: 201805192359
Blood Product Expiration Date: 201805192359
Blood Product Expiration Date: 201805192359
Blood Product Expiration Date: 201805202359
ISSUE DATE / TIME: 201804261549
ISSUE DATE / TIME: 201804261549
ISSUE DATE / TIME: 201804261945
ISSUE DATE / TIME: 201804262238
ISSUE DATE / TIME: 201804271518
Unit Type and Rh: 5100
Unit Type and Rh: 5100
Unit Type and Rh: 5100
Unit Type and Rh: 5100
Unit Type and Rh: 5100
Unit Type and Rh: 5100
Unit Type and Rh: 5100
Unit Type and Rh: 5100

## 2017-04-28 LAB — COOXEMETRY PANEL
Carboxyhemoglobin: 1.2 % (ref 0.5–1.5)
METHEMOGLOBIN: 1.1 % (ref 0.0–1.5)
O2 Saturation: 78.6 %
TOTAL HEMOGLOBIN: 10.1 g/dL — AB (ref 12.0–16.0)

## 2017-04-28 LAB — BASIC METABOLIC PANEL
Anion gap: 7 (ref 5–15)
BUN: 12 mg/dL (ref 6–20)
CALCIUM: 8.2 mg/dL — AB (ref 8.9–10.3)
CO2: 35 mmol/L — ABNORMAL HIGH (ref 22–32)
Chloride: 96 mmol/L — ABNORMAL LOW (ref 101–111)
Creatinine, Ser: 1.18 mg/dL (ref 0.61–1.24)
GFR calc Af Amer: >60 mL/min (ref >60)
Glucose, Bld: 123 mg/dL — ABNORMAL HIGH (ref 65–99)
Potassium: 3.1 mmol/L — ABNORMAL LOW (ref 3.5–5.1)
SODIUM: 138 mmol/L (ref 135–145)

## 2017-04-28 MED ORDER — POTASSIUM CHLORIDE CRYS ER 20 MEQ PO TBCR
40.0000 meq | EXTENDED_RELEASE_TABLET | Freq: Once | ORAL | Status: AC
  Administered 2017-04-28: 40 meq via ORAL
  Filled 2017-04-28: qty 2

## 2017-04-28 MED ORDER — SODIUM CHLORIDE 0.9 % IV SOLN
30.0000 meq | Freq: Once | INTRAVENOUS | Status: AC
  Administered 2017-04-28: 30 meq via INTRAVENOUS
  Filled 2017-04-28: qty 15

## 2017-04-28 MED ORDER — POTASSIUM CHLORIDE CRYS ER 20 MEQ PO TBCR
40.0000 meq | EXTENDED_RELEASE_TABLET | Freq: Two times a day (BID) | ORAL | Status: DC
  Administered 2017-04-28 – 2017-04-30 (×5): 40 meq via ORAL
  Filled 2017-04-28 (×6): qty 2

## 2017-04-28 MED ORDER — ORAL CARE MOUTH RINSE
15.0000 mL | Freq: Two times a day (BID) | OROMUCOSAL | Status: DC
  Administered 2017-04-29 – 2017-04-30 (×3): 15 mL via OROMUCOSAL

## 2017-04-28 NOTE — Progress Notes (Signed)
Patient ID: Alan Diaz, male   DOB: [birth year], 52 y.o.   MRN: [id number]  SICU Evening Rounds:  Hemodynamically stable off milrinone  Diuresing well  Glucose went up to 353 late morning after a dietary indiscretion. Coming back down.

## 2017-04-28 NOTE — Progress Notes (Signed)
4 Days Post-Op Procedure(s) (LRB): CORONARY ARTERY BYPASS GRAFTING (CABG) times three using left intenal mammary artery and right saphenous vein (N/A) TRANSESOPHAGEAL ECHOCARDIOGRAM (TEE) (N/A) Subjective:  No complaints. Says he feels great. Ambulated around ICU.  Objective: Vital signs in last 24 hours: Temp:  [98.2 F (36.8 C)-99 F (37.2 C)] 98.5 F (36.9 C) (04/29 0700) Cardiac Rhythm: Normal sinus rhythm;Sinus tachycardia (04/29 0800) Resp:  [8-27] 27 (04/29 0900) BP: (108-137)/(70-103) 126/96 (04/29 0900) SpO2:  [92 %-100 %] 92 % (04/29 0900) Weight:  [88.9 kg (196 lb)] 88.9 kg (196 lb) (04/29 0800)  Hemodynamic parameters for last 24 hours:    Intake/Output from previous day: 04/28 0701 - 04/29 0700 In: 1350.2 [P.O.:470; I.V.:350.2; IV Piggyback:530] Out: 1530 [Urine:1530] Intake/Output this shift: Total I/O In: 139.6 [P.O.:120; I.V.:19.6] Out: 350 [Urine:350]  General appearance: alert and cooperative Neurologic: intact Heart: regular rate and rhythm, S1, S2 normal, no murmur, click, rub or gallop Lungs: clear to auscultation bilaterally Extremities: edema mild Wound: incisions ok  Lab Results:  Recent Labs  04/26/17 0305  04/27/17 0119 04/27/17 0515  WBC 14.7*  --   --  14.0*  HGB 9.9*  < > 9.9* 9.8*  HCT 29.1*  < > 29.0* 29.6*  PLT 137*  --   --  150  < > = values in this interval not displayed. BMET:  Recent Labs  04/27/17 0515 04/28/17 0326  NA 135 138  K 3.5 3.1*  CL 95* 96*  CO2 31 35*  GLUCOSE 170* 123*  BUN 11 12  CREATININE 1.33* 1.18  CALCIUM 8.1* 8.2*    PT/INR:  Recent Labs  04/26/17 0305  LABPROT 15.2  INR 1.19   ABG    Component Value Date/Time   PHART 7.358 04/26/2017 0438   HCO3 25.4 04/26/2017 0438   TCO2 31 04/27/2017 0119   ACIDBASEDEF 5.0 (H) 04/25/2017 1607   O2SAT 78.6 04/28/2017 0330   CBG (last 3)   Recent Labs  04/27/17 2344 04/28/17 0327 04/28/17 0808  GLUCAP 167* 113* 153*     Assessment/Plan: S/P Procedure(s) (LRB): CORONARY ARTERY BYPASS GRAFTING (CABG) times three using left intenal mammary artery and right saphenous vein (N/A) TRANSESOPHAGEAL ECHOCARDIOGRAM (TEE) (N/A)  POD 3  He is hemodynamically stable in sinus rhythm on milrinone 0.125 with Co-ox 79 this am. Will stop milrinone and repeat Co-ox in the am. If Co-ox ok can start Coreg with severe LV dysfunction.   Volume excess: He diuresed some yesterday but wt only down 1 lb. He is still 11 lbs over preop. Will continue diuretic.  DM: glucose under adequate control on Levemir and SSI.  Continue IS, ambulation   LOS: 6 days    Alleen Borne 04/28/2017

## 2017-04-29 DIAGNOSIS — I10 Essential (primary) hypertension: Secondary | ICD-10-CM

## 2017-04-29 LAB — CBC
HCT: 32.7 % — ABNORMAL LOW (ref 39.0–52.0)
Hemoglobin: 10.8 g/dL — ABNORMAL LOW (ref 13.0–17.0)
MCH: 31.4 pg (ref 26.0–34.0)
MCHC: 33 g/dL (ref 30.0–36.0)
MCV: 95.1 fL (ref 78.0–100.0)
Platelets: 242 10*3/uL (ref 150–400)
RBC: 3.44 MIL/uL — ABNORMAL LOW (ref 4.22–5.81)
RDW: 13.8 % (ref 11.5–15.5)
WBC: 9.1 10*3/uL (ref 4.0–10.5)

## 2017-04-29 LAB — GLUCOSE, CAPILLARY
Glucose-Capillary: 156 mg/dL — ABNORMAL HIGH (ref 65–99)
Glucose-Capillary: 195 mg/dL — ABNORMAL HIGH (ref 65–99)
Glucose-Capillary: 313 mg/dL — ABNORMAL HIGH (ref 65–99)
Glucose-Capillary: 152 mg/dL — ABNORMAL HIGH (ref 65–99)
Glucose-Capillary: 197 mg/dL — ABNORMAL HIGH (ref 65–99)

## 2017-04-29 LAB — COOXEMETRY PANEL
CARBOXYHEMOGLOBIN: 1.1 % (ref 0.5–1.5)
METHEMOGLOBIN: 1.1 % (ref 0.0–1.5)
O2 SAT: 62.4 %
Total hemoglobin: 10.6 g/dL — ABNORMAL LOW (ref 12.0–16.0)

## 2017-04-29 LAB — BASIC METABOLIC PANEL
Anion gap: 7 (ref 5–15)
BUN: 12 mg/dL (ref 6–20)
CALCIUM: 8.5 mg/dL — AB (ref 8.9–10.3)
CO2: 34 mmol/L — ABNORMAL HIGH (ref 22–32)
Chloride: 96 mmol/L — ABNORMAL LOW (ref 101–111)
Creatinine, Ser: 1.14 mg/dL (ref 0.61–1.24)
GFR calc Af Amer: >60 mL/min (ref >60)
GLUCOSE: 168 mg/dL — AB (ref 65–99)
Potassium: 3.7 mmol/L (ref 3.5–5.1)
Sodium: 137 mmol/L (ref 135–145)

## 2017-04-29 MED ORDER — CARVEDILOL 6.25 MG PO TABS
6.2500 mg | ORAL_TABLET | Freq: Two times a day (BID) | ORAL | Status: DC
  Administered 2017-04-29 – 2017-04-30 (×3): 6.25 mg via ORAL
  Filled 2017-04-29 (×3): qty 1

## 2017-04-29 MED ORDER — ENOXAPARIN SODIUM 40 MG/0.4ML ~~LOC~~ SOLN
40.0000 mg | SUBCUTANEOUS | Status: DC
Start: 2017-04-29 — End: 2017-05-01
  Administered 2017-04-29 – 2017-04-30 (×2): 40 mg via SUBCUTANEOUS
  Filled 2017-04-29 (×2): qty 0.4

## 2017-04-29 MED ORDER — MAGNESIUM HYDROXIDE 400 MG/5ML PO SUSP
30.0000 mL | Freq: Every day | ORAL | Status: DC | PRN
  Administered 2017-04-30: 30 mL via ORAL
  Filled 2017-04-29: qty 30

## 2017-04-29 MED ORDER — SODIUM CHLORIDE 0.9% FLUSH
3.0000 mL | Freq: Two times a day (BID) | INTRAVENOUS | Status: DC
  Administered 2017-04-29: 3 mL via INTRAVENOUS

## 2017-04-29 MED ORDER — FUROSEMIDE 10 MG/ML IJ SOLN
40.0000 mg | Freq: Every day | INTRAMUSCULAR | Status: DC
  Administered 2017-04-29 – 2017-04-30 (×2): 40 mg via INTRAVENOUS
  Filled 2017-04-29 (×2): qty 4

## 2017-04-29 MED ORDER — SODIUM CHLORIDE 0.9% FLUSH: 3.0000 mL | INTRAVENOUS | Status: DC | PRN

## 2017-04-29 MED ORDER — INSULIN ASPART 100 UNIT/ML ~~LOC~~ SOLN
0.0000 [IU] | Freq: Three times a day (TID) | SUBCUTANEOUS | Status: DC
  Administered 2017-04-29: 4 [IU] via SUBCUTANEOUS
  Administered 2017-04-29: 16 [IU] via SUBCUTANEOUS
  Administered 2017-04-29: 4 [IU] via SUBCUTANEOUS
  Administered 2017-04-30: 8 [IU] via SUBCUTANEOUS
  Administered 2017-04-30: 4 [IU] via SUBCUTANEOUS
  Administered 2017-04-30: 8 [IU] via SUBCUTANEOUS
  Administered 2017-04-30: 2 [IU] via SUBCUTANEOUS
  Administered 2017-05-01: 8 [IU] via SUBCUTANEOUS

## 2017-04-29 MED ORDER — SODIUM CHLORIDE 0.9 % IV SOLN
30.0000 meq | Freq: Once | INTRAVENOUS | Status: DC
  Filled 2017-04-29: qty 15

## 2017-04-29 MED ORDER — MOVING RIGHT ALONG BOOK
Freq: Once | Status: AC
  Administered 2017-04-29: 08:00:00
  Filled 2017-04-29: qty 1

## 2017-04-29 MED ORDER — SODIUM CHLORIDE 0.9 % IV SOLN: 250.0000 mL | INTRAVENOUS | Status: DC | PRN

## 2017-04-29 MED ORDER — METFORMIN HCL 500 MG PO TABS
500.0000 mg | ORAL_TABLET | Freq: Two times a day (BID) | ORAL | Status: DC
  Administered 2017-04-29 (×2): 500 mg via ORAL
  Filled 2017-04-29 (×2): qty 1

## 2017-04-29 NOTE — Progress Notes (Signed)
CARDIAC REHAB PHASE I   Pt in bed, on RA, states he already walked twice today, declines ambulation with cardiac rehab at this time. Encouraged one more walk today. Will follow up tomorrow.   Joylene Grapes, RN, BSN 04/29/2017 1:58 PM

## 2017-04-29 NOTE — Progress Notes (Signed)
[facility] DAILY ICU PROGRESS NOTE                   [address]            [place] [address]          [phone]   5 Days Post-Op Procedure(s) (LRB): CORONARY ARTERY BYPASS GRAFTING (CABG) times three using left intenal mammary artery and right saphenous vein (N/A) TRANSESOPHAGEAL ECHOCARDIOGRAM (TEE) (N/A)  Total Length of Stay:  LOS: 7 days   Subjective: Looks and feels well, ambulation conts to improve  Objective: Vital signs in last 24 hours: Temp:  [97.7 F (36.5 C)-98.8 F (37.1 C)] 98.4 F (36.9 C) (04/30 0422) Pulse Rate:  [82-91] 82 (04/30 0422) Cardiac Rhythm: Normal sinus rhythm (04/30 0400) Resp:  [8-27] 20 (04/30 0700) BP: (106-134)/(71-96) 134/86 (04/30 0700) SpO2:  [92 %-98 %] 95 % (04/30 0700) Weight:  [195 lb 6.4 oz (88.6 kg)-196 lb (88.9 kg)] 195 lb 6.4 oz (88.6 kg) (04/30 0600)  Filed Weights   04/28/17 0800 04/29/17 0422 04/29/17 0600  Weight: 196 lb (88.9 kg) 195 lb 15.8 oz (88.9 kg) 195 lb 6.4 oz (88.6 kg)    Weight change:    Hemodynamic parameters for last 24 hours:    Intake/Output from previous day: 04/29 0701 - 04/30 0700 In: 732.8 [P.O.:600; I.V.:132.8] Out: 2100 [Urine:2100]  Intake/Output this shift: No intake/output data recorded.  Current Meds: Scheduled Meds: . amiodarone  400 mg Oral BID  . atorvastatin  40 mg Oral q1800  . bisacodyl  10 mg Oral Daily   Or  . bisacodyl  10 mg Rectal Daily  . Chlorhexidine Gluconate Cloth  6 each Topical Daily  . docusate sodium  200 mg Oral Daily  . fentaNYL  75 mcg Transdermal Q72H  . furosemide  40 mg Intravenous BID  . insulin aspart  0-24 Units Subcutaneous Q4H  . insulin detemir  14 Units Subcutaneous BID  . mouth rinse  15 mL Mouth Rinse BID  . pantoprazole  40 mg Oral Daily  . potassium chloride  40 mEq Oral BID  . sodium chloride flush  10-40 mL Intracatheter Q12H  . sodium chloride flush  3 mL Intravenous Q12H   Continuous Infusions: . lactated ringers  Stopped (04/28/17 1900)   PRN Meds:.fentaNYL (SUBLIMAZE) injection, ondansetron (ZOFRAN) IV, oxyCODONE, sodium chloride flush, sodium chloride flush, traMADol  General appearance: alert, cooperative and no distress Heart: regular rate and rhythm Lungs: mildly dim in bases Abdomen: benign Extremities: no significant edema Wound: incis healing well  Lab Results: CBC: Recent Labs  04/27/17 0515 04/29/17 0450  WBC 14.0* 9.1  HGB 9.8* 10.8*  HCT 29.6* 32.7*  PLT 150 242   BMET:  Recent Labs  04/28/17 0326 04/29/17 0450  NA 138 137  K 3.1* 3.7  CL 96* 96*  CO2 35* 34*  GLUCOSE 123* 168*  BUN 12 12  CREATININE 1.18 1.14  CALCIUM 8.2* 8.5*    CMET: Lab Results  Component Value Date   WBC 9.1 04/29/2017   HGB 10.8 (L) 04/29/2017   HCT 32.7 (L) 04/29/2017   PLT 242 04/29/2017   GLUCOSE 168 (H) 04/29/2017   CHOL 268 (H) 12/17/2016   TRIG 130 12/17/2016   HDL 50 12/17/2016   LDLCALC 192 (H) 12/17/2016   ALT 19 04/27/2017   AST 19 04/27/2017   NA 137 04/29/2017   K 3.7 04/29/2017   CL 96 (L) 04/29/2017  CREATININE 1.14 04/29/2017   BUN 12 04/29/2017   CO2 34 (H) 04/29/2017   TSH 0.997 09/09/2016   PSA 1.31 01/19/2009   INR 1.19 04/26/2017   HGBA1C 7.2 (H) 09/09/2016   MICROALBUR 6.09 (H) 01/19/2009      PT/INR: No results for input(s): LABPROT, INR in the last 72 hours. Radiology: No results found.   Assessment/Plan: S/P Procedure(s) (LRB): CORONARY ARTERY BYPASS GRAFTING (CABG) times three using left intenal mammary artery and right saphenous vein (N/A) TRANSESOPHAGEAL ECHOCARDIOGRAM (TEE) (N/A)  1 conts to make excellent progress 2 hemodyn stable, CO-OX 62, BP a little low at times to start ACE/ARB quite yet 3 diuresing well, wt 10 # above preop- cont to diurese. Creat 1.14 4 H/H stable, no leukocytosis 5 sugars not well controlled- restart metformin and hopefully can wean insulin down quickly 6 wean O2, cont pulm toilet 7 routine rehab 8 poss tx  to 2 [facility] soon    Jru Pense E 04/29/2017 7:50 AM

## 2017-04-29 NOTE — Progress Notes (Signed)
     SUBJECTIVE: No pain or dyspnea  Tele: sinus  BP 134/86   Pulse 82   Temp 98.4 F (36.9 C) (Oral)   Resp 20   Ht 6' (1.829 m)   Wt 195 lb 6.4 oz (88.6 kg)   SpO2 95%   BMI 26.50 kg/m   Intake/Output Summary (Last 24 hours) at 04/29/17 0757 Last data filed at 04/29/17 0600  Gross per 24 hour  Intake            732.8 ml  Output             2100 ml  Net          -1367.2 ml    PHYSICAL EXAM General: Well developed, well nourished, in no acute distress. Alert and oriented x 3.  Psych:  Good affect, responds appropriately Neck: No JVD. No masses noted.  Lungs: Clear bilaterally with no wheezes or rhonci noted.  Heart: RRR with no murmurs noted. Abdomen: Bowel sounds are present. Soft, non-tender.  Extremities: No lower extremity edema.   LABS: Basic Metabolic Panel:  Recent Labs  09/32/35 0515 04/28/17 0326 04/29/17 0450  NA 135 138 137  K 3.5 3.1* 3.7  CL 95* 96* 96*  CO2 31 35* 34*  GLUCOSE 170* 123* 168*  BUN 11 12 12   CREATININE 1.33* 1.18 1.14  CALCIUM 8.1* 8.2* 8.5*  MG 1.7  --   --    CBC:  Recent Labs  04/27/17 0515 04/29/17 0450  WBC 14.0* 9.1  HGB 9.8* 10.8*  HCT 29.6* 32.7*  MCV 93.4 95.1  PLT 150 242    Current Meds: . amiodarone  400 mg Oral BID  . atorvastatin  40 mg Oral q1800  . bisacodyl  10 mg Oral Daily   Or  . bisacodyl  10 mg Rectal Daily  . Chlorhexidine Gluconate Cloth  6 each Topical Daily  . docusate sodium  200 mg Oral Daily  . fentaNYL  75 mcg Transdermal Q72H  . furosemide  40 mg Intravenous BID  . insulin aspart  0-24 Units Subcutaneous Q4H  . insulin detemir  14 Units Subcutaneous BID  . mouth rinse  15 mL Mouth Rinse BID  . pantoprazole  40 mg Oral Daily  . potassium chloride  40 mEq Oral BID  . sodium chloride flush  10-40 mL Intracatheter Q12H  . sodium chloride flush  3 mL Intravenous Q12H     ASSESSMENT AND PLAN: 52 yo male with history of CAD, ischemic cardiomyopathy, chronic combined systolic and  diastolic CHF, HTN, HLD admitted 04/22/17 with chest pain and was found to have critical left main stenosis with emergent CABG 04/24/17.   1. CAD/NSTEMI: He is now POD # 5 s/p 3V CABG. He is stable this am. Will continue statin. Would restart Plavix when safe from surgery standpoint.   2. Acute on chronic combined systolic/diastolic CHF/Ischemic cardiomyopathy: LVEF20-25% pre-bypass. Would restart Coreg and consider restarting Lisinopril as BP tolerates. He looks euvolemic on my exam today. May be able to stop IV Lasix today.   3. HTN: BP controlled  4. HLD: on high dose statin     Verne Carrow  4/30/20187:57 AM

## 2017-04-29 NOTE — Progress Notes (Signed)
5 Days Post-Op Procedure(s) (LRB): CORONARY ARTERY BYPASS GRAFTING (CABG) times three using left intenal mammary artery and right saphenous vein (N/A) TRANSESOPHAGEAL ECHOCARDIOGRAM (TEE) (N/A) Subjective: Progressing after emerg CABG for MI , shock, preop IABP Milrinone off, co-0x 65% nsr Ambulating Weaning O2 Objective: Vital signs in last 24 hours: Temp:  [97.7 F (36.5 C)-99.1 F (37.3 C)] 99.1 F (37.3 C) (04/30 0757) Pulse Rate:  [82-91] 82 (04/30 0422) Cardiac Rhythm: Normal sinus rhythm (04/30 0400) Resp:  [8-27] 20 (04/30 0700) BP: (106-134)/(71-96) 134/86 (04/30 0700) SpO2:  [92 %-98 %] 95 % (04/30 0700) Weight:  [195 lb 6.4 oz (88.6 kg)-195 lb 15.8 oz (88.9 kg)] 195 lb 6.4 oz (88.6 kg) (04/30 0600)  Hemodynamic parameters for last 24 hours:  stable  Intake/Output from previous day: 04/29 0701 - 04/30 0700 In: 732.8 [P.O.:600; I.V.:132.8] Out: 2100 [Urine:2100] Intake/Output this shift: No intake/output data recorded.       Exam    General- alert and comfortable   Lungs- clear without rales, wheezes   Cor- regular rate and rhythm, no murmur , gallop   Abdomen- soft, non-tender   Extremities - warm, non-tender, minimal edema   Neuro- oriented, appropriate, no focal weakness   Lab Results:  Recent Labs  04/27/17 0515 04/29/17 0450  WBC 14.0* 9.1  HGB 9.8* 10.8*  HCT 29.6* 32.7*  PLT 150 242   BMET:  Recent Labs  04/28/17 0326 04/29/17 0450  NA 138 137  K 3.1* 3.7  CL 96* 96*  CO2 35* 34*  GLUCOSE 123* 168*  BUN 12 12  CREATININE 1.18 1.14  CALCIUM 8.2* 8.5*    PT/INR: No results for input(s): LABPROT, INR in the last 72 hours. ABG    Component Value Date/Time   PHART 7.358 04/26/2017 0438   HCO3 25.4 04/26/2017 0438   TCO2 31 04/27/2017 0119   ACIDBASEDEF 5.0 (H) 04/25/2017 1607   O2SAT 62.4 04/29/2017 0500   CBG (last 3)   Recent Labs  04/28/17 2045 04/28/17 2344 04/29/17 0449  GLUCAP 227* 136* 156*     Assessment/Plan: S/P Procedure(s) (LRB): CORONARY ARTERY BYPASS GRAFTING (CABG) times three using left intenal mammary artery and right saphenous vein (N/A) TRANSESOPHAGEAL ECHOCARDIOGRAM (TEE) (N/A) Mobilize Diuresis Diabetes control Plan for transfer to step-down: see transfer orders start low dose coreg   LOS: 7 days    Alan Diaz 04/29/2017

## 2017-04-29 NOTE — Plan of Care (Signed)
Problem: Bowel/Gastric: Goal: Will not experience complications related to bowel motility Outcome: Progressing Patient educated on necessity for laxatives and stool softeners; will take in AM

## 2017-04-30 ENCOUNTER — Inpatient Hospital Stay (HOSPITAL_COMMUNITY): Payer: Medicaid Other

## 2017-04-30 DIAGNOSIS — I472 Ventricular tachycardia: Secondary | ICD-10-CM

## 2017-04-30 DIAGNOSIS — E7800 Pure hypercholesterolemia, unspecified: Secondary | ICD-10-CM

## 2017-04-30 LAB — BASIC METABOLIC PANEL
Anion gap: 8 (ref 5–15)
BUN: 13 mg/dL (ref 6–20)
CO2: 31 mmol/L (ref 22–32)
Calcium: 8.7 mg/dL — ABNORMAL LOW (ref 8.9–10.3)
Chloride: 97 mmol/L — ABNORMAL LOW (ref 101–111)
Creatinine, Ser: 1.24 mg/dL (ref 0.61–1.24)
GFR calc Af Amer: >60 mL/min (ref >60)
GFR calc non Af Amer: >60 mL/min (ref >60)
Glucose, Bld: 149 mg/dL — ABNORMAL HIGH (ref 65–99)
Potassium: 4 mmol/L (ref 3.5–5.1)
Sodium: 136 mmol/L (ref 135–145)

## 2017-04-30 LAB — CBC
HCT: 32.3 % — ABNORMAL LOW (ref 39.0–52.0)
Hemoglobin: 10.8 g/dL — ABNORMAL LOW (ref 13.0–17.0)
MCH: 31.1 pg (ref 26.0–34.0)
MCHC: 33.4 g/dL (ref 30.0–36.0)
MCV: 93.1 fL (ref 78.0–100.0)
Platelets: 307 10*3/uL (ref 150–400)
RBC: 3.47 MIL/uL — ABNORMAL LOW (ref 4.22–5.81)
RDW: 13.5 % (ref 11.5–15.5)
WBC: 9.6 10*3/uL (ref 4.0–10.5)

## 2017-04-30 LAB — GLUCOSE, CAPILLARY
Glucose-Capillary: 146 mg/dL — ABNORMAL HIGH (ref 65–99)
Glucose-Capillary: 204 mg/dL — ABNORMAL HIGH (ref 65–99)
Glucose-Capillary: 207 mg/dL — ABNORMAL HIGH (ref 65–99)
Glucose-Capillary: 184 mg/dL — ABNORMAL HIGH (ref 65–99)

## 2017-04-30 MED ORDER — CARVEDILOL 12.5 MG PO TABS
12.5000 mg | ORAL_TABLET | Freq: Two times a day (BID) | ORAL | Status: DC
  Administered 2017-04-30 – 2017-05-01 (×2): 12.5 mg via ORAL
  Filled 2017-04-30 (×2): qty 1

## 2017-04-30 MED ORDER — ALTEPLASE 2 MG IJ SOLR: 2.0000 mg | Freq: Once | INTRAMUSCULAR | Status: DC

## 2017-04-30 MED ORDER — METFORMIN HCL 500 MG PO TABS
1000.0000 mg | ORAL_TABLET | Freq: Two times a day (BID) | ORAL | Status: DC
  Administered 2017-04-30 – 2017-05-01 (×3): 1000 mg via ORAL
  Filled 2017-04-30 (×3): qty 2

## 2017-04-30 NOTE — Discharge Summary (Signed)
Physician Discharge Summary  Patient ID: Alan Diaz MRN: [id number] DOB/AGE: 1965-04-04 52 y.o.  Admit date: 04/22/2017 Discharge date: 05/01/2017  Admission Diagnoses:NSTEMI  Discharge Diagnoses:  Principal Problem:   NSTEMI (non-ST elevated myocardial infarction) [facility]) Active Problems:   Type 2 diabetes mellitus with circulatory disorder (HCC)   TOBACCO ABUSE   Acute combined systolic and diastolic heart failure, NYHA class 4 (HCC)   Essential hypertension   HLD (hyperlipidemia)   CKD (chronic kidney disease) stage 3, GFR 30-59 ml/min   Left main coronary artery thrombosis (HCC)   S/P CABG x 3   Patient Active Problem List   Diagnosis Date Noted  . 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 Left main coronary artery thrombosis (HCC)   . CKD (chronic kidney disease) stage 3, GFR 30-59 ml/min 04/23/2017  . Cardiomyopathy, ischemic 09/09/2016  . Non compliance w medication regimen 09/09/2016  . Elevated troponin 09/09/2016  . NSTEMI (non-ST elevated myocardial infarction) (HCC) 05/05/2015  . GERD (gastroesophageal reflux disease)   . HLD (hyperlipidemia)   . CAD- S/P PCI May 2016   . Acute combined systolic and diastolic heart failure, NYHA class 4 (HCC) 05/03/2015  . Essential hypertension 05/03/2015  . TOBACCO ABUSE 01/19/2009  . Type 2 diabetes mellitus with circulatory disorder (HCC) 10/31/2008    History of Present Illness    Alan Diaz is a 52 y.o. male with past medical history of CAD (s/p DES to pLAD and dRCA in 05/2015), ischemic cardiomyopathy, chronic combined systolic and diastolic CHF (EF previously 25-30%, improved to 35-40% by echo in 08/2016), HTN, HLD, and tobacco use who presents to [facility] ED on 04/22/2017 for evaluation of chest pain and worsening dyspnea.   In talking with the patient, he reports worsening dyspnea at rest and with exertion for the past 4-5 days. Reports symptoms are similar to what he experienced in 2016 when he required multiple stents. He  denies any associated chest pain or palpitations. Has experienced numbness and tingling along his right upper extremities. Has also noticed worsening orthopnea, no lower extremity edema or abdominal distension.   Reports having not taken any of his cardiac medications in the past 4+ days including ASA, Lipitor, Coreg, Plavix, Lisinopril, and Lasix. Says he skips his medications for weeks at a time due to "not feeling like taking them". He is still smoking 4-5 cigarettes per day.    Initial labs show WBC of 10.7, Hgb 13.6, platelets 221. Na+ 136, K+ 4.4, creatinine 1.42 (baseline 1.2 - 1.3). Initial troponin 3.12. BNP 960. EKG shows sinus tachycardia, HR 102, with LVH and diffuse ST depression along the inferior and lateral leads (similar to prior tracings). CXR with small pleural effusions bilaterally and fine interstitial opacity in both perihilar regions.   The patient was admitted for further evaluation and treatment.  Discharged Condition: good  Hospital Course: The patient was admitted through the emergency department. Cardiology consultation was obtained. He did rule in for non-STEMI. He was started emergently on heparin and aspirin. He was given 2 venous Lasix to assist with diuresis as he was found to have combined acute and chronic systolic and diastolic CHF/ischemic cardiomyopathy. Echocardiogram was done and showed ejection fraction to be in the 25% range. It was felt he should undergo cardiac catheterization. It is noted that he did have some acute renal insufficiency with creatinine did improve over time. Cardiac catheterization was performed on 04/24/2017. He was found to have severe left main stenosis with severe LV dysfunction and emergent CABG  was recommended. An intra-aortic balloon pump was placed in the cath lab. He was seen in cardiothoracic surgical consultation by Dr Donata Clay who agreed with recommendations to proceed with prompt surgical intervention. He was taken the operating  room where he underwent the below described procedure.  Postoperative hospital course:  Overall the patient has progressed nicely. He was extubated using standard protocols. The intra-aortic balloon pump was weaned and removed. He was initially placed on a Lasix drip force significant volume overload. He initially required inotropic support which has been weaned without difficulty over time. His diabetes has been controlled using standard protocols. He has been started on low-dose Coreg. Incisions are noted to be healing well without evidence of infection. He is tolerating gradually increasing activities using standard cardiac rehabilitation modalities. He has an expected acute blood loss anemia and values have stabilized. Most recent creatinine is 1.24. At time of discharge the patient is felt to be quite stable.  Consults: cardiology  Significant Diagnostic Studies: angiography: cardiac cath, echocardiogram, routine post-op labs and serial CXR's  Treatments: surgery:  DATE OF PROCEDURE:  04/24/2017 DATE OF DISCHARGE:                              OPERATIVE REPORT   OPERATION: 1. Emergency coronary artery bypass grafting x3 (left internal mammary     artery to left anterior descending, saphenous vein graft to     circumflex marginal, saphenous vein graft to posterior descending). 2. Endoscopic harvest of right leg greater saphenous vein. 3. Preoperative balloon pump placed in cath lab for critical left main     stenosis and severe left ventricular dysfunction.  ANESTHESIA:  General by Dr. Kipp Brood.  SURGEON:  Kerin Perna, M.D.  ASSISTANT:  Rowe Clack, PA-C.     Discharge Exam: Blood pressure 130/88, pulse 92, temperature 98.1 F (36.7 C), temperature source Oral, resp. rate 18, height 6' (1.829 m), weight 191 lb (86.6 kg), SpO2 96 %.  General appearance: alert, cooperative and no distress Heart: regular rate and rhythm Lungs: clear to auscultation  bilaterally Abdomen: benign Extremities: no edema Wound: incis healing well Disposition: 01-Home or Self Care  Discharge Instructions    Discharge patient    Complete by:  As directed    Discharge disposition:  01-Home or Self Care   Discharge patient date:  05/01/2017     Allergies as of 05/01/2017      Reactions   Aspirin Hives, Other (See Comments)   Makes pt feel funny   Other Nausea And Vomiting, Other (See Comments)   Grape products      Medication List    STOP taking these medications   ALKA-SELTZER PLUS COLD & FLU PO   dicyclomine 20 MG tablet Commonly known as:  BENTYL   furosemide 40 MG tablet Commonly known as:  LASIX   loperamide 2 MG capsule Commonly known as:  IMODIUM   nitroGLYCERIN 0.4 MG SL tablet Commonly known as:  NITROSTAT   ondansetron 4 MG disintegrating tablet Commonly known as:  ZOFRAN ODT   potassium chloride SA 20 MEQ tablet Commonly known as:  K-DUR,KLOR-CON     TAKE these medications   amiodarone 200 MG tablet Commonly known as:  PACERONE Take 1 tablet (200 mg total) by mouth 2 (two) times daily.   atorvastatin 40 MG tablet Commonly known as:  LIPITOR Take 1 tablet (40 mg total) by mouth daily at 6 PM.  What changed:  See the new instructions.   carvedilol 12.5 MG tablet Commonly known as:  COREG Take 1 tablet (12.5 mg total) by mouth 2 (two) times daily with a meal.   clopidogrel 75 MG tablet Commonly known as:  PLAVIX Take 1 tablet (75 mg total) by mouth daily.   lisinopril 10 MG tablet Commonly known as:  PRINIVIL,ZESTRIL TAKE 1 TABLET BY MOUTH DAILY   metFORMIN 1000 MG tablet Commonly known as:  GLUCOPHAGE Take 1 tablet (1,000 mg total) by mouth 2 (two) times daily with a meal. What changed:  medication strength  how much to take   traMADol 50 MG tablet Commonly known as:  ULTRAM Take 1-2 tablets (50-100 mg total) by mouth every 6 (six) hours as needed for moderate pain.      Follow-up Information    Kathlee Nations Trigt III, MD Follow up.   Specialty:  Cardiothoracic Surgery Why:  see discharge paperwork. Also obtain a chest Xray at [facility] 1/2 hour prior to seeing the surgeon. [facility] is located in the same ofice complex as the [facility]. Contact information: [address] [place] [address] [phone]        Dietrich Pates, MD Follow up.   Specialty:  Cardiology Why:  see discharge paperwork for 2 week cardiology appointment Contact information: [address] [place] [address] [phone]          The patient has been discharged on:   1.Beta Blocker:  Yes [   y]                              No   [   ]                              If No, reason:  2.Ace Inhibitor/ARB: Yes [ y  ]                                     No  [    ]                                     If No, reason:  3.Statin:   Yes [ y  ]                  No  [   ]                  If No, reason:  4.Ecasa:  Yes  [   ]                  No   [n   ]                  If No, reason:allergy, on plavix   Signed: Dala Breault E 05/01/2017, 8:17 AM

## 2017-04-30 NOTE — Progress Notes (Signed)
Called into pt room for request of pain medication. Pt requesting fentanyl, but this order has been discontinued. Educated patient on need to try PO medications first - pt willing to try tramadol. States his chest is just uncomfortable, but has been sleeping the majority of the afternoon.  Leonidas Romberg, RN

## 2017-04-30 NOTE — Discharge Instructions (Signed)
Endoscopic Saphenous Vein Harvesting, Care After ° °Refer to this sheet in the next few weeks. These instructions provide you with information about caring for yourself after your procedure. Your health care provider may also give you more specific instructions. Your treatment has been planned according to current medical practices, but problems sometimes occur. Call your health care provider if you have any problems or questions after your procedure. °What can I expect after the procedure? °After the procedure, it is common to have: °· Pain. °· Bruising. °· Swelling. °· Numbness. °Follow these instructions at home: °Medicine  °· Take over-the-counter and prescription medicines only as told by your health care provider. °· Do not drive or operate heavy machinery while taking prescription pain medicine. °Incision care  ° °· Follow instructions from your health care provider about how to take care of the cut made during surgery (incision). Make sure you: °¨ Wash your hands with soap and water before you change your bandage (dressing). If soap and water are not available, use hand sanitizer. °¨ Change your dressing as told by your health care provider. °¨ Leave stitches (sutures), skin glue, or adhesive strips in place. These skin closures may need to be in place for 2 weeks or longer. If adhesive strip edges start to loosen and curl up, you may trim the loose edges. Do not remove adhesive strips completely unless your health care provider tells you to do that. °· Check your incision area every day for signs of infection. Check for: °¨ More redness, swelling, or pain. °¨ More fluid or blood. °¨ Warmth. °¨ Pus or a bad smell. °General instructions  °· Raise (elevate) your legs above the level of your heart while you are sitting or lying down. °· Do any exercises your health care providers have given you. These may include deep breathing, coughing, and walking exercises. °· Do not shower, take baths, swim, or use a hot  tub unless told by your health care provider. °· Wear your elastic stocking if told by your health care provider. °· Keep all follow-up visits as told by your health care provider. This is important. °Contact a health care provider if: °· Medicine does not help your pain. °· Your pain gets worse. °· You have new leg bruises or your leg bruises get bigger. °· You have a fever. °· Your leg feels numb. °· You have more redness, swelling, or pain around your incision. °· You have more fluid or blood coming from your incision. °· Your incision feels warm to the touch. °· You have pus or a bad smell coming from your incision. °Get help right away if: °· Your pain is severe. °· You develop pain, tenderness, warmth, redness, or swelling in any part of your leg. °· You have chest pain. °· You have trouble breathing. °This information is not intended to replace advice given to you by your health care provider. Make sure you discuss any questions you have with your health care provider. °Document Released: 08/29/2011 Document Revised: 05/24/2016 Document Reviewed: 10/31/2015 °Elsevier Interactive Patient Education © 2017 [employer] °Coronary Artery Bypass Grafting, Care After °This sheet gives you information about how to care for yourself after your procedure. Your health care provider may also give you more specific instructions. If you have problems or questions, contact your health care provider. °What can I expect after the procedure? °After the procedure, it is common to have: °· Nausea and a lack of appetite. °· Constipation. °· Weakness and fatigue. °· Depression   or irritability. °· Pain or discomfort in your incision areas. °Follow these instructions at home: °Medicines  °· Take over-the-counter and prescription medicines only as told by your health care provider. Do not stop taking medicines or start any new medicines without approval from your health care provider. °· If you were prescribed an antibiotic  medicine, take it as told by your health care provider. Do not stop taking the antibiotic even if you start to feel better. °· Do not drive or use heavy machinery while taking prescription pain medicine. °Incision care  °· Follow instructions from your health care provider about how to take care of your incisions. Make sure you: °¨ Wash your hands with soap and water before you change your bandage (dressing). If soap and water are not available, use hand sanitizer. °¨ Change your dressing as told by your health care provider. °¨ Leave stitches (sutures), skin glue, or adhesive strips in place. These skin closures may need to stay in place for 2 weeks or longer. If adhesive strip edges start to loosen and curl up, you may trim the loose edges. Do not remove adhesive strips completely unless your health care provider tells you to do that. °· Keep incision areas clean, dry, and protected. °· Check your incision areas every day for signs of infection. Check for: °¨ More redness, swelling, or pain. °¨ More fluid or blood. °¨ Warmth. °¨ Pus or a bad smell. °· If incisions were made in your legs: °¨ Avoid crossing your legs. °¨ Avoid sitting for long periods of time. Change positions every 30 minutes. °¨ Raise (elevate) your legs when you are sitting. °Bathing  °· Do not take baths, swim, or use a hot tub until your health care provider approves. °· Only take sponge baths. Pat the incisions dry. Do not rub incisions with a washcloth or towel. °· Ask your health care provider when you can shower. °Eating and drinking  °· Eat foods that are high in fiber, such as raw fruits and vegetables, whole grains, beans, and nuts. Meats should be lean cut. Avoid canned, processed, and fried foods. This can help prevent constipation and is a recommended part of a heart-healthy diet. °· Drink enough fluid to keep your urine clear or pale yellow. °· Limit alcohol intake to no more than 1 drink a day for nonpregnant women and 2 drinks a  day for men. One drink equals 12 oz of beer, 5 oz of wine, or 1½ oz of hard liquor. °Activity  °· Rest and limit your activity as told by your health care provider. You may be instructed to: °¨ Stop any activity right away if you have chest pain, shortness of breath, irregular heartbeats, or dizziness. Get help right away if you have any of these symptoms. °¨ Move around frequently for short periods or take short walks as directed by your health care provider. Gradually increase your activities. You may need physical therapy or cardiac rehabilitation to help strengthen your muscles and build your endurance. °¨ Avoid lifting, pushing, or pulling anything that is heavier than 10 lb (4.5 kg) for at least 6 weeks or as told by your health care provider. °· Do not drive until your health care provider approves. °· Ask your health care provider when you may return to work. °· Ask your health care provider when you may resume sexual activity. °General instructions  °· Do not use any products that contain nicotine or tobacco, such as cigarettes and e-cigarettes. If you need   help quitting, ask your health care provider. °· Take 2-3 deep breaths every few hours during the day, while you recover. This helps expand your lungs and prevent complications like pneumonia after surgery. °· If you were given a device called an incentive spirometer, use it several times a day to practice deep breathing. Support your chest with a pillow or your arms when you take deep breaths or cough. °· Wear compression stockings as told by your health care provider. These stockings help to prevent blood clots and reduce swelling in your legs. °· Weigh yourself every day. This helps identify if your body is holding (retaining) fluid that may make your heart and lungs work harder. °· Keep all follow-up visits as told by your health care provider. This is important. °Contact a health care provider if: °· You have more redness, swelling, or pain around  any incision. °· You have more fluid or blood coming from any incision. °· Any incision feels warm to the touch. °· You have pus or a bad smell coming from any incision °· You have a fever. °· You have swelling in your ankles or legs. °· You have pain in your legs. °· You gain 2 lb (0.9 kg) or more a day. °· You are nauseous or you vomit. °· You have diarrhea. °Get help right away if: °· You have chest pain that spreads to your jaw or arms. °· You are short of breath. °· You have a fast or irregular heartbeat. °· You notice a "clicking" in your breastbone (sternum) when you move. °· You have numbness or weakness in your arms or legs. °· You feel dizzy or light-headed. °Summary °· After the procedure, it is common to have pain or discomfort in the incision areas. °· Do not take baths, swim, or use a hot tub until your health care provider approves. °· Gradually increase your activities. You may need physical therapy or cardiac rehabilitation to help strengthen your muscles and build your endurance. °· Weigh yourself every day. This helps identify if your body is holding (retaining) fluid that may make your heart and lungs work harder. °This information is not intended to replace advice given to you by your health care provider. Make sure you discuss any questions you have with your health care provider. °Document Released: 07/06/2005 Document Revised: 11/05/2016 Document Reviewed: 11/05/2016 °Elsevier Interactive Patient Education © 2017 Elsevier Inc. ° °

## 2017-04-30 NOTE — Progress Notes (Signed)
CARDIAC REHAB PHASE I   PRE:  Rate/Rhythm: 90 SR  BP:  Sitting: 119/8        SaO2: 91 RA  MODE:  Ambulation: 400 ft   POST:  Rate/Rhythm: 98 SR  BP:  Sitting: 132/90         SaO2: 93 RA  Pt in bed, states he has been having "anxiety attacks," states he is anxious about going home. Pt ambulated 400 ft on RA, rolling walker, assist x1, very slow, steady gait, tolerated well with no complaints. Verbal reminder cues for sternal precautions, otherwise, pt fairly independent. Encouraged additional ambulation x2 today.  Pt to bed for EPW removal per RN request after walk, call bell within reach. Will follow.   [id number] Joylene Grapes, RN, BSN 04/30/2017 10:04 AM

## 2017-04-30 NOTE — Progress Notes (Signed)
SUBJECTIVE: Mild chest wall pain. Minimal dyspnea with exertion.   Tele: sinus  BP 132/79 (BP Location: Left Arm)   Pulse 86   Temp 98.4 F (36.9 C) (Oral)   Resp 18   Ht 6' (1.829 m)   Wt 192 lb 12.8 oz (87.5 kg)   SpO2 94%   BMI 26.15 kg/m   Intake/Output Summary (Last 24 hours) at 04/30/17 0907 Last data filed at 04/30/17 0600  Gross per 24 hour  Intake              380 ml  Output             2275 ml  Net            -1895 ml    PHYSICAL EXAM General: Well developed, well nourished, in no acute distress. Alert and oriented x 3.  Psych:  Good affect, responds appropriately Neck: No JVD. No masses noted.  Lungs: Clear bilaterally with no wheezes or rhonci noted.  Heart: RRR with no murmurs noted. Abdomen: Bowel sounds are present. Soft, non-tender.  Extremities: No lower extremity edema.   LABS: Basic Metabolic Panel:  Recent Labs  16/10/96 0450 04/30/17 0423  NA 137 136  K 3.7 4.0  CL 96* 97*  CO2 34* 31  GLUCOSE 168* 149*  BUN 12 13  CREATININE 1.14 1.24  CALCIUM 8.5* 8.7*   CBC:  Recent Labs  04/29/17 0450 04/30/17 0423  WBC 9.1 9.6  HGB 10.8* 10.8*  HCT 32.7* 32.3*  MCV 95.1 93.1  PLT 242 307   Current Meds: . amiodarone  400 mg Oral BID  . atorvastatin  40 mg Oral q1800  . bisacodyl  10 mg Oral Daily   Or  . bisacodyl  10 mg Rectal Daily  . carvedilol  6.25 mg Oral BID WC  . Chlorhexidine Gluconate Cloth  6 each Topical Daily  . docusate sodium  200 mg Oral Daily  . enoxaparin (LOVENOX) injection  40 mg Subcutaneous Q24H  . furosemide  40 mg Intravenous Daily  . insulin aspart  0-24 Units Subcutaneous TID AC & HS  . mouth rinse  15 mL Mouth Rinse BID  . metFORMIN  1,000 mg Oral BID WC  . pantoprazole  40 mg Oral Daily  . potassium chloride  40 mEq Oral BID  . sodium chloride flush  10-40 mL Intracatheter Q12H  . sodium chloride flush  3 mL Intravenous Q12H     ASSESSMENT AND PLAN: 52 yo male with history of CAD, ischemic  cardiomyopathy, chronic combined systolic and diastolic CHF, HTN, HLD admitted 04/22/17 with chest pain and was found to have critical left main stenosis with emergent CABG 04/24/17.   1. CAD/NSTEMI: He is POD #6 s/p 3V CABG and is doing well this am. He is allergic to ASA. Would restart Plavix before discharge. Continue statin and beta blocker. Would restart Plavix before discharge.   2. Acute on chronic combined systolic/diastolic CHF/Ischemic cardiomyopathy: LVEF=20-25% pre-bypass. He has had LVEF as low as 25% in 2016 and up to 35% in 2017. Continue Coreg and consider restarting Lisinopril now that BP is stable. I think his volume status is stable today but his weight is still up 7 lbs over his admission weight. IV Lasix today then po Lasix tomorrow.     3. HTN: BP controlled  4. HLD: on high dose statin  5. NSVT: He has long standing cardiomyopathy but now with several short runs of NSVT.  Continue amiodarone and beta blocker. If he has more NSVT, may need to consider a Lifevest at time of discharge.     Verne Carrow  5/1/20189:07 AM

## 2017-04-30 NOTE — Progress Notes (Addendum)
[address]       [place] [address]             [phone]      6 Days Post-Op Procedure(s) (LRB): CORONARY ARTERY BYPASS GRAFTING (CABG) times three using left intenal mammary artery and right saphenous vein (N/A) TRANSESOPHAGEAL ECHOCARDIOGRAM (TEE) (N/A) Subjective: conts to feel better, no specific c/o  Objective: Vital signs in last 24 hours: Temp:  [98.4 F (36.9 C)-99.1 F (37.3 C)] 98.4 F (36.9 C) (05/01 0427) Pulse Rate:  [86-90] 86 (05/01 0427) Cardiac Rhythm: Ventricular tachycardia;Other (Comment) (05/01 0308) Resp:  [13-19] 18 (05/01 0427) BP: (102-132)/(65-97) 132/79 (05/01 0427) SpO2:  [94 %-98 %] 94 % (05/01 0427) Weight:  [192 lb 12.8 oz (87.5 kg)] 192 lb 12.8 oz (87.5 kg) (05/01 0429)  Hemodynamic parameters for last 24 hours:    Intake/Output from previous day: 04/30 0701 - 05/01 0700 In: 800 [P.O.:780; I.V.:20] Out: 2350 [Urine:2350] Intake/Output this shift: No intake/output data recorded.  General appearance: alert, cooperative and no distress Heart: regular rate and rhythm Lungs: min dim in the bases Abdomen: benign Extremities: no edema Wound: incis healing well  Lab Results:  Recent Labs  04/29/17 0450 04/30/17 0423  WBC 9.1 9.6  HGB 10.8* 10.8*  HCT 32.7* 32.3*  PLT 242 307   BMET:  Recent Labs  04/29/17 0450 04/30/17 0423  NA 137 136  K 3.7 4.0  CL 96* 97*  CO2 34* 31  GLUCOSE 168* 149*  BUN 12 13  CREATININE 1.14 1.24  CALCIUM 8.5* 8.7*    PT/INR: No results for input(s): LABPROT, INR in the last 72 hours. ABG    Component Value Date/Time   PHART 7.358 04/26/2017 0438   HCO3 25.4 04/26/2017 0438   TCO2 31 04/27/2017 0119   ACIDBASEDEF 5.0 (H) 04/25/2017 1607   O2SAT 62.4 04/29/2017 0500   CBG (last 3)   Recent Labs  04/29/17 1645 04/29/17 2021 04/30/17 0644  GLUCAP 152* 197* 146*    Meds Scheduled Meds: . amiodarone  400 mg Oral BID  . atorvastatin  40 mg Oral q1800  .  bisacodyl  10 mg Oral Daily   Or  . bisacodyl  10 mg Rectal Daily  . carvedilol  6.25 mg Oral BID WC  . Chlorhexidine Gluconate Cloth  6 each Topical Daily  . docusate sodium  200 mg Oral Daily  . enoxaparin (LOVENOX) injection  40 mg Subcutaneous Q24H  . furosemide  40 mg Intravenous Daily  . insulin aspart  0-24 Units Subcutaneous TID AC & HS  . insulin detemir  14 Units Subcutaneous BID  . mouth rinse  15 mL Mouth Rinse BID  . metFORMIN  500 mg Oral BID WC  . pantoprazole  40 mg Oral Daily  . potassium chloride  40 mEq Oral BID  . sodium chloride flush  10-40 mL Intracatheter Q12H  . sodium chloride flush  3 mL Intravenous Q12H   Continuous Infusions: . sodium chloride    . lactated ringers Stopped (04/28/17 1900)   PRN Meds:.sodium chloride, magnesium hydroxide, ondansetron (ZOFRAN) IV, oxyCODONE, sodium chloride flush, sodium chloride flush, traMADol  Xrays No results found.  Assessment/Plan: S/P Procedure(s) (LRB): CORONARY ARTERY BYPASS GRAFTING (CABG) times three using left intenal mammary artery and right saphenous vein (N/A) TRANSESOPHAGEAL ECHOCARDIOGRAM (TEE) (N/A)  1 doing well  2 short burst of vtach x2, beta blocker, K+- normal, severe preop left vent dysfunction - monitor 3 sugars  fairly adeq controlled- stop insulin and increase metformin 4 wt still up, cont diuretic - doesn't appear significantly volume overloaded 5 routine pulm toilet/rehab 6 poss home soon- d/c epw's    LOS: 8 days    Diaz,Alan E 04/30/2017 Acute post op expected blood loss anemia Hold ace-inhibitor due to creatinine Increase coreg 12.5 bid patient examined and medical record reviewed,agree with above note prob home wed Alan Diaz 04/30/2017

## 2017-04-30 NOTE — Progress Notes (Signed)
EPW removed per protocol. Pt verbalized understanding of bedrest until 1100. VSS. Call bell within reach, will continue to monitor.  Leonidas Romberg, RN

## 2017-05-01 ENCOUNTER — Other Ambulatory Visit: Payer: Self-pay | Admitting: Surgical

## 2017-05-01 ENCOUNTER — Other Ambulatory Visit: Payer: Self-pay | Admitting: Physician Assistant

## 2017-05-01 ENCOUNTER — Other Ambulatory Visit: Payer: Self-pay | Admitting: Internal Medicine

## 2017-05-01 LAB — GLUCOSE, CAPILLARY
Glucose-Capillary: 216 mg/dL — ABNORMAL HIGH (ref 65–99)
Glucose-Capillary: 176 mg/dL — ABNORMAL HIGH (ref 65–99)

## 2017-05-01 MED ORDER — CLOPIDOGREL BISULFATE 75 MG PO TABS
75.0000 mg | ORAL_TABLET | Freq: Every day | ORAL | Status: DC
  Administered 2017-05-01: 75 mg via ORAL
  Filled 2017-05-01: qty 1

## 2017-05-01 MED ORDER — TRAMADOL HCL 50 MG PO TABS: 50.0000 mg | ORAL_TABLET | Freq: Four times a day (QID) | ORAL | 0 refills | Status: DC | PRN

## 2017-05-01 MED ORDER — ATORVASTATIN CALCIUM 40 MG PO TABS: 40.0000 mg | ORAL_TABLET | Freq: Every day | ORAL | 1 refills | Status: DC

## 2017-05-01 MED ORDER — METFORMIN HCL 1000 MG PO TABS: 1000.0000 mg | ORAL_TABLET | Freq: Two times a day (BID) | ORAL | 1 refills | Status: DC

## 2017-05-01 MED ORDER — AMIODARONE HCL 200 MG PO TABS: 200.0000 mg | ORAL_TABLET | Freq: Two times a day (BID) | ORAL | 1 refills | Status: DC

## 2017-05-01 MED ORDER — SODIUM CHLORIDE 0.9% FLUSH
10.0000 mL | INTRAVENOUS | Status: DC | PRN
  Administered 2017-05-01: 10 mL

## 2017-05-01 NOTE — Care Management Note (Addendum)
Case Management Note Original Note Created by Letha Cape RN, CM   Patient Details  Name: Alan Diaz MRN: [id number] Date of Birth: 1965-11-14  Subjective/Objective:   Pt admitted NSTEMI-  Post op CABG x 3 on 4/26, for diuresis, weaned balloon pump for removal tomorrow.                Action/Plan: NCM will follow for dc needs. PTA pt lived at home-   Expected Discharge Date:  05/01/17               Expected Discharge Plan:  Home/Self Care  In-House Referral:  NA  Discharge planning Services  CM Consult  Post Acute Care Choice:  NA Choice offered to:  NA  DME Arranged:    DME Agency:     HH Arranged:    HH Agency:     Status of Service:  Completed, signed off  If discussed at [employer] of Stay Meetings, dates discussed:  5/1  Discharge Disposition: home/self care   Additional Comments:  05/01/17- 1100- Alan Tiede RN, CM- pt for d/c home today with spouse-  No CM needs noted for discharge.   Alan Diaz [place], RN 05/01/2017, 11:10 AM [phone]

## 2017-05-01 NOTE — Progress Notes (Signed)
Discussed with the patient and all questioned fully answered. He will call me if any problems arise.  PICC removed prior to DC. Telemetry removed, CCMD notified. Pt given AVS and paper Rx. Wife at bedside for all teaching.   Leonidas Romberg, RN

## 2017-05-01 NOTE — Progress Notes (Signed)
[address]       [place] [address]             [phone]      7 Days Post-Op Procedure(s) (LRB): CORONARY ARTERY BYPASS GRAFTING (CABG) times three using left intenal mammary artery and right saphenous vein (N/A) TRANSESOPHAGEAL ECHOCARDIOGRAM (TEE) (N/A) Subjective: Feels good  Objective: Vital signs in last 24 hours: Temp:  [97.9 F (36.6 C)-98.2 F (36.8 C)] 98.1 F (36.7 C) (05/02 0401) Pulse Rate:  [81-92] 85 (05/02 0401) Cardiac Rhythm: Normal sinus rhythm (05/02 0700) Resp:  [16-18] 18 (05/02 0401) BP: (106-130)/(51-88) 130/88 (05/02 0401) SpO2:  [95 %-96 %] 96 % (05/02 0401) Weight:  [191 lb (86.6 kg)] 191 lb (86.6 kg) (05/02 0401)  Hemodynamic parameters for last 24 hours:    Intake/Output from previous day: 05/01 0701 - 05/02 0700 In: 1320 [P.O.:1320] Out: 1900 [Urine:1900] Intake/Output this shift: No intake/output data recorded.  General appearance: alert, cooperative and no distress Heart: regular rate and rhythm Lungs: clear to auscultation bilaterally Abdomen: benign Extremities: no edema Wound: incis healing well  Lab Results:  Recent Labs  04/29/17 0450 04/30/17 0423  WBC 9.1 9.6  HGB 10.8* 10.8*  HCT 32.7* 32.3*  PLT 242 307   BMET:  Recent Labs  04/29/17 0450 04/30/17 0423  NA 137 136  K 3.7 4.0  CL 96* 97*  CO2 34* 31  GLUCOSE 168* 149*  BUN 12 13  CREATININE 1.14 1.24  CALCIUM 8.5* 8.7*    PT/INR: No results for input(s): LABPROT, INR in the last 72 hours. ABG    Component Value Date/Time   PHART 7.358 04/26/2017 0438   HCO3 25.4 04/26/2017 0438   TCO2 31 04/27/2017 0119   ACIDBASEDEF 5.0 (H) 04/25/2017 1607   O2SAT 62.4 04/29/2017 0500   CBG (last 3)   Recent Labs  04/30/17 1657 04/30/17 2102 05/01/17 0645  GLUCAP 207* 184* 216*    Meds Scheduled Meds: . alteplase  2 mg Intracatheter Once  . amiodarone  400 mg Oral BID  . atorvastatin  40 mg Oral q1800  . bisacodyl  10 mg  Oral Daily   Or  . bisacodyl  10 mg Rectal Daily  . carvedilol  12.5 mg Oral BID WC  . docusate sodium  200 mg Oral Daily  . enoxaparin (LOVENOX) injection  40 mg Subcutaneous Q24H  . furosemide  40 mg Intravenous Daily  . insulin aspart  0-24 Units Subcutaneous TID AC & HS  . mouth rinse  15 mL Mouth Rinse BID  . metFORMIN  1,000 mg Oral BID WC  . pantoprazole  40 mg Oral Daily  . potassium chloride  40 mEq Oral BID  . sodium chloride flush  10-40 mL Intracatheter Q12H  . sodium chloride flush  3 mL Intravenous Q12H   Continuous Infusions: . sodium chloride    . lactated ringers Stopped (04/28/17 1900)   PRN Meds:.sodium chloride, magnesium hydroxide, ondansetron (ZOFRAN) IV, oxyCODONE, sodium chloride flush, sodium chloride flush, sodium chloride flush, traMADol  Xrays Dg Chest 2 View  Result Date: 04/30/2017 CLINICAL DATA:  CABG. EXAM: CHEST  2 VIEW COMPARISON:  04/27/2017 . FINDINGS: Interim removal of right IJ sheath. Right PICC line noted with its tip over the right atrium. Prior CABG. Cardiomegaly. Bibasilar atelectasis, improved from prior exam. Tiny pleural effusion improved from prior exam. Bilateral pleuroparenchymal thickening consistent scarring. No pneumothorax. IMPRESSION: 1. Interim removal of right IJ sheath. Interim placement  of right PICC line, its tip is over the right atrium. 2.  Prior CABG.  Stable cardiomegaly. 3. Interim improvement of basilar atelectasis. Tiny right pleural effusion, improved from prior exam. Electronically Signed   By: Maisie Fus  Register   On: 04/30/2017 07:43    Assessment/Plan: S/P Procedure(s) (LRB): CORONARY ARTERY BYPASS GRAFTING (CABG) times three using left intenal mammary artery and right saphenous vein (N/A) TRANSESOPHAGEAL ECHOCARDIOGRAM (TEE) (N/A)  1 doing well 2 no further vtach 3 start plavix as per cards 4 discussed low carb diet for type 2 DM- cont glucophage for now 5 had BM 6 D/C home today  LOS: 9 days    Diaz,Alan  E 05/01/2017

## 2017-05-01 NOTE — Progress Notes (Signed)
Ambulated 300 feet on room air tolerated very well.

## 2017-05-01 NOTE — Progress Notes (Signed)
[id number] Education completed with pt and significant other that voiced understanding. Stressed sternal precautions and IS. Discussed carb counting, low sodium and heart healthy food choices. Discussed smoking cessation and gave fake cigarette. Pt plans to quit cold [place]. Gave smoking cessation handout also. Discussed CRP 2 and referring to CRP 2. Pt stated he has had CHF in the past and has been instructed to weigh daily and watch sodium. Pt stated he is motivated to make changes. Luetta Nutting RN BSN 05/01/2017 11:15 AM

## 2017-05-03 ENCOUNTER — Telehealth: Payer: Self-pay | Admitting: Surgical

## 2017-05-03 NOTE — Telephone Encounter (Signed)
      [address]       [place] [address]             [phone]    Alan Diaz [id number]   S/P :  DATE OF PROCEDURE:  04/24/2017 DATE OF DISCHARGE:                              OPERATIVE REPORT   OPERATION: 1. Emergency coronary artery bypass grafting x3 (left internal mammary     artery to left anterior descending, saphenous vein graft to     circumflex marginal, saphenous vein graft to posterior descending). 2. Endoscopic harvest of right leg greater saphenous vein. 3. Preoperative balloon pump placed in cath lab for critical left main     stenosis and severe left ventricular dysfunction.  ANESTHESIA:  General by Dr. Kipp Brood.  SURGEON:  Kerin Perna, M.D.  ASSISTANT:  Rowe Clack, PA-C.    Discharged home on 05/01/17  Medications: Allergies as of 05/03/2017      Reactions   Aspirin Hives, Other (See Comments)   Makes pt feel funny   Other Nausea And Vomiting, Other (See Comments)   Grape products      Medication List       Accurate as of 05/03/17  1:52 PM. Always use your most recent med list.          amiodarone 200 MG tablet Commonly known as:  PACERONE Take 1 tablet (200 mg total) by mouth 2 (two) times daily.   atorvastatin 40 MG tablet Commonly known as:  LIPITOR Take 1 tablet (40 mg total) by mouth daily at 6 PM.   carvedilol 12.5 MG tablet Commonly known as:  COREG TAKE 1 TABLET BY MOUTH TWICE DAILY WITH A MEAL   clopidogrel 75 MG tablet Commonly known as:  PLAVIX Take 1 tablet (75 mg total) by mouth daily.   lisinopril 10 MG tablet Commonly known as:  PRINIVIL,ZESTRIL TAKE 1 TABLET BY MOUTH DAILY, MUST KEEP APPOINTMENT FOR ADDITIONAL REFILLS   metFORMIN 1000 MG tablet Commonly known as:  GLUCOPHAGE Take 1 tablet (1,000 mg total) by mouth 2 (two) times daily with a meal.   traMADol 50 MG tablet Commonly known as:  ULTRAM Take 1-2 tablets (50-100 mg total) by mouth every 6 (six) hours as needed for  moderate pain.       Coumadin:  N/A  Problems/Concerns: Patient reports he is doing well and has no specific complaints. Pain is controlled. He conts to ambulate well and use his IS>  Assessment:  Patient is doing well.   Further instructions  provided.  Contact office if concerns or problems develop  Follow up Appointment  Has appt scheduled with Tereso Newcomer PA-C and Dr Donata Clay

## 2017-05-06 ENCOUNTER — Telehealth (HOSPITAL_COMMUNITY): Payer: Self-pay | Admitting: Internal Medicine

## 2017-05-06 NOTE — Telephone Encounter (Signed)
Verified Medicaid insurance benefits through Passport Reference [id number].Marland Kitchen... KJ

## 2017-05-09 ENCOUNTER — Encounter: Payer: Self-pay | Admitting: *Deleted

## 2017-05-13 ENCOUNTER — Other Ambulatory Visit: Payer: Self-pay | Admitting: Surgical

## 2017-05-13 ENCOUNTER — Other Ambulatory Visit: Payer: Self-pay | Admitting: *Deleted

## 2017-05-17 ENCOUNTER — Ambulatory Visit (INDEPENDENT_AMBULATORY_CARE_PROVIDER_SITE_OTHER): Payer: Medicaid Other | Admitting: Physician Assistant

## 2017-05-17 ENCOUNTER — Encounter: Payer: Self-pay | Admitting: Physician Assistant

## 2017-05-17 ENCOUNTER — Telehealth: Payer: Self-pay | Admitting: *Deleted

## 2017-05-17 VITALS — BP 136/80 | HR 71 | Ht 72.0 in | Wt 191.8 lb

## 2017-05-17 DIAGNOSIS — I2510 Atherosclerotic heart disease of native coronary artery without angina pectoris: Secondary | ICD-10-CM | POA: Diagnosis not present

## 2017-05-17 DIAGNOSIS — I214 Non-ST elevation (NSTEMI) myocardial infarction: Secondary | ICD-10-CM

## 2017-05-17 DIAGNOSIS — I255 Ischemic cardiomyopathy: Secondary | ICD-10-CM | POA: Diagnosis not present

## 2017-05-17 DIAGNOSIS — N183 Chronic kidney disease, stage 3 unspecified: Secondary | ICD-10-CM

## 2017-05-17 DIAGNOSIS — I472 Ventricular tachycardia: Secondary | ICD-10-CM

## 2017-05-17 DIAGNOSIS — I4729 Other ventricular tachycardia: Secondary | ICD-10-CM

## 2017-05-17 DIAGNOSIS — I10 Essential (primary) hypertension: Secondary | ICD-10-CM

## 2017-05-17 DIAGNOSIS — E7800 Pure hypercholesterolemia, unspecified: Secondary | ICD-10-CM | POA: Diagnosis not present

## 2017-05-17 DIAGNOSIS — I5042 Chronic combined systolic (congestive) and diastolic (congestive) heart failure: Secondary | ICD-10-CM | POA: Diagnosis not present

## 2017-05-17 LAB — BASIC METABOLIC PANEL
BUN / CREAT RATIO: 11 (ref 9–20)
BUN: 13 mg/dL (ref 6–24)
CO2: 25 mmol/L (ref 18–29)
CREATININE: 1.14 mg/dL (ref 0.76–1.27)
Calcium: 9.2 mg/dL (ref 8.7–10.2)
Chloride: 98 mmol/L (ref 96–106)
GFR calc Af Amer: 85 mL/min/{1.73_m2} (ref >59)
GFR, EST NON AFRICAN AMERICAN: 74 mL/min/{1.73_m2} (ref >59)
GLUCOSE: 200 mg/dL — AB (ref 65–99)
POTASSIUM: 5 mmol/L (ref 3.5–5.2)
SODIUM: 138 mmol/L (ref 134–144)

## 2017-05-17 MED ORDER — ATORVASTATIN CALCIUM 80 MG PO TABS: 80.0000 mg | ORAL_TABLET | Freq: Every day | ORAL | 3 refills | Status: DC

## 2017-05-17 MED ORDER — SPIRONOLACTONE 25 MG PO TABS: 12.5000 mg | ORAL_TABLET | Freq: Every day | ORAL | 3 refills | Status: DC

## 2017-05-17 MED ORDER — AMIODARONE HCL 200 MG PO TABS: 200.0000 mg | ORAL_TABLET | Freq: Every day | ORAL | 3 refills | Status: DC

## 2017-05-17 NOTE — Telephone Encounter (Signed)
-----   Message from Beatrice Lecher, [place] sent at 05/17/2017  4:55 PM EDT ----- Please call patient: The kidney function (BUN, Creatinine) and potassium are normal. The glucose is elevated.  Continue with current treatment plan. Tereso Newcomer, PA-C    05/17/2017 4:55 PM

## 2017-05-17 NOTE — Telephone Encounter (Signed)
Pt gave permission ok to s/w friend Joyce Gross for lab results. Joyce Gross has verbalized understanding to pt's lab results. Joyce Gross and pt agree to have pt continue with plan as outlined in today's visit.

## 2017-05-17 NOTE — Progress Notes (Signed)
Cardiology Office Note:    Date:  05/17/2017   ID:  Alan Diaz, DOB March 20, 1965, MRN [id number]  PCP:  Patient, No Pcp Per  Cardiologist:  Dr. Dietrich Pates    Referring MD: Pricilla Riffle, MD   Chief Complaint  Patient presents with  . Hospitalization Follow-up    NSTEMI >> CABG    History of Present Illness:    Alan Diaz is a 52 y.o. male with a hx of CAD (s/p DES to pLAD and dRCA in 05/2015), ischemic cardiomyopathy, chronic combined systolic and diastolic CHF (EF previously 25-30%, improved to 35-40% by echo in 08/2016), Diabetes, HTN, HLD, and tobacco use. Last seen by Dr. Tenny Craw 12/17.  He was admitted 4/23-5/2 with a NSTEMI complicated by a/c combined systolic and diastolic CHF.  Echo demonstrated worsening LV function with an EF 25% and mild diastolic dysfunction. LHC demonstrated severe LM stenosis and IABP was placed.  He underwent emergent CABG with Dr. Zenaida Niece Trigt (LIMA-LAD, SVG-OM, SVG-PDA). Postoperative course was notable for volume excess requiring Lasix drip. Patient did have worsening creatinine at the time of his cardiac catheterization. Creatinine was improved and stable at discharge.  Notes indicate patient had some NSVT and he was placed on Amiodarone.  He has an allergy to ASA and was placed back on Plavix.    He returns for post hospitalization follow-up.  He is here with his wife.  Since DC, he has been progressing well.  His chest pain is improving. His breathing is improving.  He denies orthopnea, PND, edema. He denies dizziness or syncope.  He denies fever.  His appetite is good.  He is not smoking.   Prior CV studies:   The following studies were reviewed today:  Intraoperative TEE 04/24/17 1+ MR, 1+ TR, severe LV dysfunction  LHC 04/24/17 LM 95 LAD ostial stent patent, mid 40 LCx ostial 80 RCA proximal 40, mid stent patent EF <25 IABP placed  Echo 04/24/17 Mild LVH, EF 25, diffuse HK, grade 1 diastolic dysfunction, trivial MR  Echo 09/10/16 EF  35-40, diffuse HK, grade 2 diastolic dysfunction  Past Medical History:  Diagnosis Date  . CAD (coronary artery disease) 2016   a. NSTEMI 5/16 s/p DES to pLAD and dRCA // b. NSTEMI 4/18 >> 95% LM, oLAD stent ok, oLCx 80, mRCA stent ok, EF < 25 >> IABP // c. s/p CABG with Dr. Zenaida Niece Trig (L-LAD, S-OM, S-PDA)  . Diabetes mellitus   . GERD (gastroesophageal reflux disease)   . HLD (hyperlipidemia)   . Hypertension   . Ischemic cardiomyopathy    a. 2D ECHO 05/02/15 with EF 25-30%, mild LV dilation. Mild LVH, mod HKof the mid-apicalanteroseptal myocardium, G1DD // b. Mild LVH, EF 25, diffuse HK, grade 1 diastolic dysfunction, trivial MR  . Tobacco abuse     Past Surgical History:  Procedure Laterality Date  . CARDIAC CATHETERIZATION N/A 05/03/2015   Procedure: Right/Left Heart Cath and Coronary Angiography;  Surgeon: Peter M Swaziland, MD;  Location: [facility] INVASIVE CV LAB CUPID;  Service: Cardiovascular;  Laterality: N/A;  . CARDIAC CATHETERIZATION N/A 05/03/2015   Procedure: Intravascular Ultrasound/IVUS;  Surgeon: Peter M Swaziland, MD;  Location: [facility] INVASIVE CV LAB [facility];  Service: Cardiovascular;  Laterality: N/A;  . CARDIAC CATHETERIZATION N/A 05/03/2015   Procedure: Coronary Stent Intervention;  Surgeon: Peter M Swaziland, MD;  Location: [facility] INVASIVE CV LAB [facility];  Service: Cardiovascular;  Laterality: N/A;  rca  . CORONARY ARTERY BYPASS GRAFT N/A 04/24/2017   Procedure: CORONARY ARTERY  BYPASS GRAFTING (CABG) times three using left intenal mammary artery and right saphenous vein;  Surgeon: Kerin Perna, MD;  Location: [facility] OR;  Service: Open Heart Surgery;  Laterality: N/A;  . IABP INSERTION N/A 04/24/2017   Procedure: IABP Insertion;  Surgeon: Marykay Lex, MD;  Location: [facility] INVASIVE CV LAB;  Service: Cardiovascular;  Laterality: N/A;  . LEFT HEART CATH AND CORONARY ANGIOGRAPHY N/A 04/24/2017   Procedure: Left Heart Cath and Coronary Angiography;  Surgeon: Marykay Lex, MD;  Location: [facility] INVASIVE CV LAB;   Service: Cardiovascular;  Laterality: N/A;  . PERCUTANEOUS CORONARY STENT INTERVENTION (PCI-S)  05/03/2015   Procedure: Percutaneous Coronary Stent Intervention (Pci-S);  Surgeon: Peter M Swaziland, MD;  Location: [facility] INVASIVE CV LAB CUPID;  Service: Cardiovascular;;  Prox LAD  . TEE WITHOUT CARDIOVERSION N/A 04/24/2017   Procedure: TRANSESOPHAGEAL ECHOCARDIOGRAM (TEE);  Surgeon: Kerin Perna, MD;  Location: [facility] OR;  Service: Open Heart Surgery;  Laterality: N/A;  . WISDOM TOOTH EXTRACTION     bottom LT, top RT    Current Medications: Current Meds  Medication Sig  . carvedilol (COREG) 12.5 MG tablet TAKE 1 TABLET BY MOUTH TWICE DAILY WITH A MEAL  . clopidogrel (PLAVIX) 75 MG tablet Take 1 tablet (75 mg total) by mouth daily.  Marland Kitchen lisinopril (PRINIVIL,ZESTRIL) 10 MG tablet TAKE 1 TABLET BY MOUTH DAILY, MUST KEEP APPOINTMENT FOR ADDITIONAL REFILLS  . metFORMIN (GLUCOPHAGE) 1000 MG tablet Take 1 tablet (1,000 mg total) by mouth 2 (two) times daily with a meal.  . traMADol (ULTRAM) 50 MG tablet TAKE 1 TO 2 TABLETS BY MOUTH EVERY 6 HOURS AS NEEDED FOR MODERATE PAIN  . [DISCONTINUED] amiodarone (PACERONE) 200 MG tablet Take 1 tablet (200 mg total) by mouth 2 (two) times daily.  . [DISCONTINUED] atorvastatin (LIPITOR) 40 MG tablet Take 1 tablet (40 mg total) by mouth daily at 6 PM.     Allergies:   Aspirin and Other   Social History   Social History  . Marital status: Divorced    Spouse name: N/A  . Number of children: N/A  . Years of education: N/A   Social History Main Topics  . Smoking status: Current Some Day Smoker    Packs/day: 0.10    Types: Cigarettes    Last attempt to quit: 05/03/2015  . Smokeless tobacco: Never Used  . Alcohol use No     Comment: very occasionally  . Drug use: No     Comment: denies 06/28/14  . Sexual activity: Not Asked   Other Topics Concern  . None   Social History Narrative  . None     Family Hx: The patient's family history includes Breast cancer in  his mother; Diabetes in his brother and sister; Heart disease in his father; Hypertension in his brother and sister.  ROS:   Please see the history of present illness.    Review of Systems  Constitution: Positive for diaphoresis.  Cardiovascular: Positive for leg swelling.  Respiratory: Positive for cough.    All other systems reviewed and are negative.   EKGs/Labs/Other Test Reviewed:    EKG:  EKG is  ordered today.  The ekg ordered today demonstrates NSR, HR 71, normal axis, T-wave inversions 1, 2, aVF, V4-V6, QTc 421 ms - of note RBBB that was on prior ECG is resolved the current ECG   Recent Labs: 09/09/2016: TSH 0.997 04/22/2017: B Natriuretic Peptide 960.3 04/27/2017: ALT 19; Magnesium 1.7 04/30/2017: BUN 13; Creatinine, Ser 1.24; Hemoglobin  10.8; Platelets 307; Potassium 4.0; Sodium 136   Recent Lipid Panel    Component Value Date/Time   CHOL 268 (H) 12/17/2016 1120   TRIG 130 12/17/2016 1120   HDL 50 12/17/2016 1120   CHOLHDL 5.4 (H) 12/17/2016 1120   VLDL 26 12/17/2016 1120   LDLCALC 192 (H) 12/17/2016 1120     Physical Exam:    VS:  BP 136/80   Pulse 71   Ht 6' (1.829 m)   Wt 191 lb 12.8 oz (87 kg)   SpO2 98%   BMI 26.01 kg/m     Wt Readings from Last 3 Encounters:  05/17/17 191 lb 12.8 oz (87 kg)  05/01/17 191 lb (86.6 kg)  01/11/17 185 lb (83.9 kg)     Physical Exam  Constitutional: He is oriented to person, place, and time. He appears well-developed and well-nourished. No distress.  HENT:  Head: Normocephalic and atraumatic.  Eyes: No scleral icterus.  Neck: Normal range of motion. No JVD present.  Cardiovascular: Normal rate, regular rhythm, S1 normal and S2 normal.   No murmur heard. Pulmonary/Chest: Effort normal and breath sounds normal. He has no wheezes. He has no rhonchi. He has no rales.  Median sternotomy wound well healed without erythema or discharge  Abdominal: Soft. There is no tenderness.  Musculoskeletal: He exhibits no edema or  deformity (right femoral arteriotomy site without hematoma or bruit; SVG harvest sites well healed without erythema or discharge on RLE).  Neurological: He is alert and oriented to person, place, and time.  Skin: Skin is warm and dry.  Psychiatric: He has a normal mood and affect.    ASSESSMENT:    1. NSTEMI (non-ST elevated myocardial infarction) (HCC)   2. Coronary artery disease involving native coronary artery of native heart without angina pectoris   3. Chronic combined systolic and diastolic CHF (congestive heart failure) (HCC)   4. NSVT (nonsustained ventricular tachycardia) (HCC)   5. Cardiomyopathy, ischemic   6. Essential hypertension   7. Pure hypercholesterolemia   8. CKD (chronic kidney disease) stage 3, GFR 30-59 ml/min    PLAN:    In order of problems listed above:  1. NSTEMI (non-ST elevated myocardial infarction) [facility]) - He is s/p emergent CABG as noted. He is allergic to aspirin. He remains on clopidogrel. Continue beta blocker, statin. He is interested in cardiac rehabilitation and plans to pursue this.    2. Coronary artery disease involving native coronary artery of native heart without angina pectoris - Status post prior PCI.  s/p non-STEMI 4/18 and subsequent CABG.  He is progressing well. He sees Dr. Donata Clay later this month. Continue beta blocker, statin, Plavix.  3. Chronic combined systolic and diastolic CHF (congestive heart failure) (HCC) - Volume appears stable on exam. Continue beta blocker, ACE inhibitor. Add spironolactone 12.5 mg daily. Check BMET today and repeat every week 2.  4. NSVT (nonsustained ventricular tachycardia) (HCC) - Continue amiodarone. Reduce amiodarone dose to 200 mg daily. Plan TSH, LFTs in 6 weeks.  5. Cardiomyopathy, ischemic - EF 25%. Plan repeat echo 90 days post CABG. If EF <35, refer to EP for ICD.  6. Essential hypertension - Blood pressure controlled.  7. Pure hypercholesterolemia - Patient should be on high-dose  statin post ACS and bypass surgery. Increase Lipitor to 80 mg daily. Plan lipids and LFTs in 6 weeks.  8. CKD (chronic kidney disease) stage 3, GFR 30-59 ml/min - Obtain BMET today and repeat every week 2 given addition of  spironolactone.  Dispo:  Return in about 4 weeks (around 06/14/2017) for Close Follow Up, w/ Dr. Tenny Craw, or Tereso Newcomer, PA-C.   Medication Adjustments/Labs and Tests Ordered: Current medicines are reviewed at length with the patient today.  Concerns regarding medicines are outlined above.  Orders/Tests:  Orders Placed This Encounter  Procedures  . Basic Metabolic Panel (BMET)  . Basic Metabolic Panel (BMET)  . Basic Metabolic Panel (BMET)  . TSH  . Lipid Profile  . Hepatic function panel  . EKG 12-Lead  . ECHOCARDIOGRAM COMPLETE   Medication changes: Meds ordered this encounter  Medications  . amiodarone (PACERONE) 200 MG tablet    Sig: Take 1 tablet (200 mg total) by mouth daily.    Dispense:  90 tablet    Refill:  3  . atorvastatin (LIPITOR) 80 MG tablet    Sig: Take 1 tablet (80 mg total) by mouth daily.    Dispense:  90 tablet    Refill:  3  . spironolactone (ALDACTONE) 25 MG tablet    Sig: Take 0.5 tablets (12.5 mg total) by mouth daily.    Dispense:  90 tablet    Refill:  3   Signed, Tereso Newcomer, PA-C  05/17/2017 2:02 PM    [facility] [address] [place], [place], [place]  [address] Phone: [phone]; Fax: [phone]

## 2017-05-17 NOTE — Patient Instructions (Signed)
Medication Instructions:  1. DECREASE AMIODARONE TO 200 MG DAILY  2. INCREASE LIPITOR TO 80 MG DAILY  3. START SPIRONOLACTONE 25 MG TABLET WITH THE DIRECTIONS ON BOTTLE TO READ TAKE 1/2 TABLET ONCE A DAY = 12.5 MG DAILY  Labwork: 1. TODAY BMET  2. REPEAT BMET TO BE DONE IN 1 WEEK AND AGAIN IN 2 WEEKS; DUE TO NEW START OF SPIRONOLACTONE  3. 6 WEEKS FOR FASTING LIPID AND LIVER PANEL AND TSH  Testing/Procedures: Your physician has requested that you have an echocardiogram. Echocardiography is a painless test that uses sound waves to create images of your heart. It provides your doctor with information about the size and shape of your heart and how well your heart's chambers and valves are working. This procedure takes approximately one hour. There are no restrictions for this procedure. THIS IS TO BE DONE AFTER 07/24/17    Follow-Up: Alan Diaz, PAC 4 WEEKS SAME DAY DR. Tenny Craw IS IN THE OFFICE  Any Other Special Instructions Will Be Listed Below (If Applicable).     If you need a refill on your cardiac medications before your next appointment, please call your pharmacy.

## 2017-05-24 ENCOUNTER — Other Ambulatory Visit: Payer: Medicaid Other | Admitting: *Deleted

## 2017-05-24 DIAGNOSIS — I472 Ventricular tachycardia: Secondary | ICD-10-CM

## 2017-05-24 DIAGNOSIS — E7800 Pure hypercholesterolemia, unspecified: Secondary | ICD-10-CM

## 2017-05-24 DIAGNOSIS — I4729 Other ventricular tachycardia: Secondary | ICD-10-CM

## 2017-05-24 DIAGNOSIS — N183 Chronic kidney disease, stage 3 unspecified: Secondary | ICD-10-CM

## 2017-05-25 LAB — BASIC METABOLIC PANEL
BUN/Creatinine Ratio: 10 (ref 9–20)
BUN: 12 mg/dL (ref 6–24)
CALCIUM: 9.1 mg/dL (ref 8.7–10.2)
CO2: 23 mmol/L (ref 18–29)
CREATININE: 1.21 mg/dL (ref 0.76–1.27)
Chloride: 99 mmol/L (ref 96–106)
GFR calc Af Amer: 79 mL/min/{1.73_m2} (ref >59)
GFR calc non Af Amer: 68 mL/min/{1.73_m2} (ref >59)
GLUCOSE: 166 mg/dL — AB (ref 65–99)
Potassium: 4.5 mmol/L (ref 3.5–5.2)
SODIUM: 139 mmol/L (ref 134–144)

## 2017-05-28 ENCOUNTER — Other Ambulatory Visit: Payer: Self-pay | Admitting: Cardiothoracic Surgery

## 2017-05-28 ENCOUNTER — Telehealth: Payer: Self-pay | Admitting: *Deleted

## 2017-05-28 DIAGNOSIS — Z951 Presence of aortocoronary bypass graft: Secondary | ICD-10-CM

## 2017-05-28 NOTE — Telephone Encounter (Signed)
Pt notified of lab results by phone with verbal understanding. Pt thanked me for my call today.  

## 2017-05-28 NOTE — Telephone Encounter (Signed)
-----   Message from Beatrice Lecher, [place] sent at 05/27/2017  5:13 PM EDT ----- Please call the patient Kidney function is ok.  The potassium is normal. Continue with current treatment plan. Tereso Newcomer, PA-C   05/27/2017 5:13 PM

## 2017-05-29 ENCOUNTER — Ambulatory Visit
Admission: RE | Admit: 2017-05-29 | Discharge: 2017-05-29 | Disposition: A | Discharge status: Home / Self Care | Payer: Medicaid Other | Source: Ambulatory Visit | Attending: Cardiothoracic Surgery | Admitting: Cardiothoracic Surgery

## 2017-05-29 ENCOUNTER — Ambulatory Visit (INDEPENDENT_AMBULATORY_CARE_PROVIDER_SITE_OTHER): Payer: Self-pay | Admitting: Cardiothoracic Surgery

## 2017-05-29 VITALS — BP 126/80 | HR 69 | Resp 20 | Ht 72.0 in | Wt 190.0 lb

## 2017-05-29 DIAGNOSIS — Z951 Presence of aortocoronary bypass graft: Secondary | ICD-10-CM

## 2017-05-29 NOTE — Progress Notes (Signed)
PCP is Patient, No Pcp Per Referring Provider is Pricilla Riffle, MD  Chief Complaint  Patient presents with  . Routine Post Op    f/u from surgery s/p Emergency coronary artery bypass grafting x3     HPI:One month follow-up after emergency CABG 3 for severe left main stenosis, MI, history of ischemic cardiomyopathy, preop balloon pump.  Patient was discharged home in stable condition sinus rhythm on amiodarone. He continues to do well. He is walking daily. He is not smoking. He denies recurrent symptoms of angina or symptoms of CHF. Surgical incisions are healing well. Overall appetite, sleep, and strength are improved. He is interested in tending cardiac rehabilitation at [facility] hospital and we will make the referral   Past Medical History:  Diagnosis Date  . CAD (coronary artery disease) 2016   a. NSTEMI 5/16 s/p DES to pLAD and dRCA // b. NSTEMI 4/18 >> 95% LM, oLAD stent ok, oLCx 80, mRCA stent ok, EF < 25 >> IABP // c. s/p CABG with Dr. Zenaida Niece Trig (L-LAD, S-OM, S-PDA)  . Diabetes mellitus   . GERD (gastroesophageal reflux disease)   . HLD (hyperlipidemia)   . Hypertension   . Ischemic cardiomyopathy    a. 2D ECHO 05/02/15 with EF 25-30%, mild LV dilation. Mild LVH, mod HKof the mid-apicalanteroseptal myocardium, G1DD // b. Mild LVH, EF 25, diffuse HK, grade 1 diastolic dysfunction, trivial MR  . Tobacco abuse     Past Surgical History:  Procedure Laterality Date  . CARDIAC CATHETERIZATION N/A 05/03/2015   Procedure: Right/Left Heart Cath and Coronary Angiography;  Surgeon: Peter M Swaziland, MD;  [facility];  Service: Cardiovascular;  Laterality: N/A;  . CARDIAC CATHETERIZATION N/A 05/03/2015   Procedure: Intravascular Ultrasound/IVUS;  Surgeon: Peter M Swaziland, MD;  [facility];  Service: Cardiovascular;  Laterality: N/A;  . CARDIAC CATHETERIZATION N/A 05/03/2015   Procedure: Coronary Stent Intervention;  Surgeon: Peter M Swaziland, MD;   [facility];  Service: Cardiovascular;  Laterality: N/A;  rca  . CORONARY ARTERY BYPASS GRAFT N/A 04/24/2017   Procedure: CORONARY ARTERY BYPASS GRAFTING (CABG) times three using left intenal mammary artery and right saphenous vein;  Surgeon: Kerin Perna, MD;  Location: [facility] OR;  Service: Open Heart Surgery;  Laterality: N/A;  . IABP INSERTION N/A 04/24/2017   Procedure: IABP Insertion;  Surgeon: Marykay Lex, MD;  Location: [facility] INVASIVE CV LAB;  Service: Cardiovascular;  Laterality: N/A;  . LEFT HEART CATH AND CORONARY ANGIOGRAPHY N/A 04/24/2017   Procedure: Left Heart Cath and Coronary Angiography;  Surgeon: Marykay Lex, MD;  Location: [facility] INVASIVE CV LAB;  Service: Cardiovascular;  Laterality: N/A;  . PERCUTANEOUS CORONARY STENT INTERVENTION (PCI-S)  05/03/2015   Procedure: Percutaneous Coronary Stent Intervention (Pci-S);  Surgeon: Peter M Swaziland, MD;  Location: [facility] INVASIVE CV LAB CUPID;  Service: Cardiovascular;;  Prox LAD  . TEE WITHOUT CARDIOVERSION N/A 04/24/2017   Procedure: TRANSESOPHAGEAL ECHOCARDIOGRAM (TEE);  Surgeon: Kerin Perna, MD;  Location: [facility] OR;  Service: Open Heart Surgery;  Laterality: N/A;  . WISDOM TOOTH EXTRACTION     bottom LT, top RT    Family History  Problem Relation Age of Onset  . Breast cancer Mother   . Heart disease Father   . Diabetes Sister   . Hypertension Sister   . Hypertension Brother   . Diabetes Brother     Social History Social History  Substance Use Topics  .  Smoking status: Current Some Day Smoker    Packs/day: 0.10    Types: Cigarettes    Last attempt to quit: 05/03/2015  . Smokeless tobacco: Never Used  . Alcohol use No     Comment: very occasionally    Current Outpatient Prescriptions  Medication Sig Dispense Refill  . amiodarone (PACERONE) 200 MG tablet Take 1 tablet (200 mg total) by mouth daily. 90 tablet 3  . atorvastatin (LIPITOR) 80 MG tablet Take 1 tablet (80 mg total) by mouth daily. 90 tablet 3   . carvedilol (COREG) 12.5 MG tablet TAKE 1 TABLET BY MOUTH TWICE DAILY WITH A MEAL 180 tablet 1  . clopidogrel (PLAVIX) 75 MG tablet Take 1 tablet (75 mg total) by mouth daily. 90 tablet 3  . lisinopril (PRINIVIL,ZESTRIL) 10 MG tablet TAKE 1 TABLET BY MOUTH DAILY, MUST KEEP APPOINTMENT FOR ADDITIONAL REFILLS 90 tablet 1  . metFORMIN (GLUCOPHAGE) 1000 MG tablet Take 1 tablet (1,000 mg total) by mouth 2 (two) times daily with a meal. 60 tablet 1  . spironolactone (ALDACTONE) 25 MG tablet Take 0.5 tablets (12.5 mg total) by mouth daily. 90 tablet 3  . traMADol (ULTRAM) 50 MG tablet TAKE 1 TO 2 TABLETS BY MOUTH EVERY 6 HOURS AS NEEDED FOR MODERATE PAIN 30 tablet 0   No current facility-administered medications for this visit.     Allergies  Allergen Reactions  . Aspirin Hives and Other (See Comments)    Makes pt feel funny  . Other Nausea And Vomiting and Other (See Comments)    Grape products    Review of Systems  No fever Not requiring narcotics for incisional pain No shortness of breath  BP 126/80   Pulse 69   Resp 20   Ht 6' (1.829 m)   Wt 190 lb (86.2 kg)   SpO2 98% Comment: RA  BMI 25.77 kg/m  Physical Exam      Exam    General- alert and comfortable   Lungs- clear without rales, wheezes   Cor- regular rate and rhythm, no murmur , gallop   Abdomen- soft, non-tender   Extremities - warm, non-tender, minimal edema   Neuro- oriented, appropriate, no focal weakness   Diagnostic Tests: Chest x-ray clear sternal wires intact No pleural effusion  Impression: Excellent early recovery one month after surgery The patient knows he may resume driving in light lifting no more than 20 pounds until 3 months after surgery. He'll continue his current medications We will refer him to cardiac rehabilitation phase 2 He'll return for follow-up and assessment of progress in 6 weeks    Mikey Bussing, MD [facility] [phone]

## 2017-05-30 ENCOUNTER — Encounter: Payer: Self-pay | Admitting: *Deleted

## 2017-05-30 ENCOUNTER — Telehealth (HOSPITAL_COMMUNITY): Payer: Self-pay

## 2017-05-30 NOTE — Progress Notes (Signed)
A referral was made via EPIC to [facility] to contact Alan Diaz to begin Phase II per the request of Dr. Kathlee Nations Trigt, his surgeon.

## 2017-05-31 ENCOUNTER — Other Ambulatory Visit: Payer: Medicaid Other

## 2017-06-03 ENCOUNTER — Other Ambulatory Visit: Payer: Medicaid Other | Admitting: *Deleted

## 2017-06-03 DIAGNOSIS — I10 Essential (primary) hypertension: Secondary | ICD-10-CM

## 2017-06-03 DIAGNOSIS — N183 Chronic kidney disease, stage 3 unspecified: Secondary | ICD-10-CM

## 2017-06-03 DIAGNOSIS — E7800 Pure hypercholesterolemia, unspecified: Secondary | ICD-10-CM

## 2017-06-04 LAB — BASIC METABOLIC PANEL
BUN/Creatinine Ratio: 8 — ABNORMAL LOW (ref 9–20)
BUN: 11 mg/dL (ref 6–24)
CO2: 23 mmol/L (ref 18–29)
CREATININE: 1.37 mg/dL — AB (ref 0.76–1.27)
Calcium: 9.2 mg/dL (ref 8.7–10.2)
Chloride: 103 mmol/L (ref 96–106)
GFR calc Af Amer: 68 mL/min/{1.73_m2} (ref >59)
GFR, EST NON AFRICAN AMERICAN: 59 mL/min/{1.73_m2} — AB (ref >59)
GLUCOSE: 184 mg/dL — AB (ref 65–99)
Potassium: 4.8 mmol/L (ref 3.5–5.2)
SODIUM: 142 mmol/L (ref 134–144)

## 2017-06-05 NOTE — Telephone Encounter (Signed)
Lft msg for pt to return call if interested in our CRP... KJ

## 2017-06-07 ENCOUNTER — Telehealth (HOSPITAL_COMMUNITY): Payer: Self-pay

## 2017-06-07 NOTE — Telephone Encounter (Signed)
I tried to call patient about scheduling for cardiac rehab. No voicemail/no answer. I will mail patient letter with information about cardiac rehab.

## 2017-06-09 NOTE — Progress Notes (Signed)
Cardiology Office Note   Date:  06/10/2017   ID:  Alan Diaz, DOB [birth year], MRN [id number]  PCP:  Patient, No Pcp Per  Cardiologist:   Dietrich Pates, MD   F/U of CAD     History of Present Illness: Alan Diaz is a 52 y.o. male with a history ofCAD (s/p DES to pLAD and dRCA in 05/2015), ischemic cardiomyopathy, chronic combined systolic and diastolic CHF (EF previously 25-30%, improved to 35-40% by echo in 08/2016), Diabetes, HTN, HLD, and tobacco use. Last seen by Dr. Tenny Craw 12/17.  He was admitted 4/23-5/2 with a NSTEMI complicated by a/c combined systolic and diastolic CHF.  Echo demonstrated worsening LV function with an EF 25% and mild diastolic dysfunction. LHC demonstrated severe LM stenosis and IABP was placed.  He underwent emergent CABG with Dr. Zenaida Niece Trigt (LIMA-LAD, SVG-OM, SVG-PDA). Postoperative course was notable for volume excess requiring Lasix drip. Patient did have worsening creatinine at the time of his cardiac catheterization. Creatinine was improved and stable at discharge.  Notes indicate patient had some NSVT and he was placed on Amiodarone.  He has an allergy to ASA and was placed back on Plavix.    He has been seen by Wende Mott and P VanTrigt since d/C  Pt says his breathing has been good  No CP        Current Meds  Medication Sig  . amiodarone (PACERONE) 200 MG tablet Take 1 tablet (200 mg total) by mouth daily.  Marland Kitchen atorvastatin (LIPITOR) 80 MG tablet Take 1 tablet (80 mg total) by mouth daily.  . carvedilol (COREG) 12.5 MG tablet TAKE 1 TABLET BY MOUTH TWICE DAILY WITH A MEAL  . lisinopril (PRINIVIL,ZESTRIL) 10 MG tablet TAKE 1 TABLET BY MOUTH DAILY, MUST KEEP APPOINTMENT FOR ADDITIONAL REFILLS  . metFORMIN (GLUCOPHAGE) 1000 MG tablet Take 1 tablet (1,000 mg total) by mouth 2 (two) times daily with a meal.  . spironolactone (ALDACTONE) 25 MG tablet Take 0.5 tablets (12.5 mg total) by mouth daily.  . traMADol (ULTRAM) 50 MG tablet TAKE 1 TO 2 TABLETS BY  MOUTH EVERY 6 HOURS AS NEEDED FOR MODERATE PAIN     Allergies:   Aspirin and Other   Past Medical History:  Diagnosis Date  . CAD (coronary artery disease) 2016   a. NSTEMI 5/16 s/p DES to pLAD and dRCA // b. NSTEMI 4/18 >> 95% LM, oLAD stent ok, oLCx 80, mRCA stent ok, EF < 25 >> IABP // c. s/p CABG with Dr. Zenaida Niece Trig (L-LAD, S-OM, S-PDA)  . Diabetes mellitus   . GERD (gastroesophageal reflux disease)   . HLD (hyperlipidemia)   . Hypertension   . Ischemic cardiomyopathy    a. 2D ECHO 05/02/15 with EF 25-30%, mild LV dilation. Mild LVH, mod HKof the mid-apicalanteroseptal myocardium, G1DD // b. Mild LVH, EF 25, diffuse HK, grade 1 diastolic dysfunction, trivial MR  . Tobacco abuse     Past Surgical History:  Procedure Laterality Date  . CARDIAC CATHETERIZATION N/A 05/03/2015   Procedure: Right/Left Heart Cath and Coronary Angiography;  Surgeon: Peter M Swaziland, MD;  [facility];  Service: Cardiovascular;  Laterality: N/A;  . CARDIAC CATHETERIZATION N/A 05/03/2015   Procedure: Intravascular Ultrasound/IVUS;  Surgeon: Peter M Swaziland, MD;  Location: [facility] INVASIVE CV LAB CUPID;  Service: Cardiovascular;  Laterality: N/A;  . CARDIAC CATHETERIZATION N/A 05/03/2015   Procedure: Coronary Stent Intervention;  Surgeon: Peter M Swaziland, MD;  Location: [facility];  Service: Cardiovascular;  Laterality: N/A;  rca  . CORONARY ARTERY BYPASS GRAFT N/A 04/24/2017   Procedure: CORONARY ARTERY BYPASS GRAFTING (CABG) times three using left intenal mammary artery and right saphenous vein;  Surgeon: Kerin Perna, MD;  Location: [facility] OR;  Service: Open Heart Surgery;  Laterality: N/A;  . IABP INSERTION N/A 04/24/2017   Procedure: IABP Insertion;  Surgeon: Marykay Lex, MD;  Location: [facility] INVASIVE CV LAB;  Service: Cardiovascular;  Laterality: N/A;  . LEFT HEART CATH AND CORONARY ANGIOGRAPHY N/A 04/24/2017   Procedure: Left Heart Cath and Coronary Angiography;  Surgeon: Marykay Lex, MD;  Location: [facility] INVASIVE CV LAB;  Service: Cardiovascular;  Laterality: N/A;  . PERCUTANEOUS CORONARY STENT INTERVENTION (PCI-S)  05/03/2015   Procedure: Percutaneous Coronary Stent Intervention (Pci-S);  Surgeon: Peter M Swaziland, MD;  Location: [facility] INVASIVE CV LAB CUPID;  Service: Cardiovascular;;  Prox LAD  . TEE WITHOUT CARDIOVERSION N/A 04/24/2017   Procedure: TRANSESOPHAGEAL ECHOCARDIOGRAM (TEE);  Surgeon: Kerin Perna, MD;  Location: [facility] OR;  Service: Open Heart Surgery;  Laterality: N/A;  . WISDOM TOOTH EXTRACTION     bottom LT, top RT     Social History:  The patient  reports that he has been smoking Cigarettes.  He has been smoking about 0.10 packs per day. He has never used smokeless tobacco. He reports that he does not drink alcohol or use drugs.   Family History:  The patient's family history includes Breast cancer in his mother; Diabetes in his brother and sister; Heart disease in his father; Hypertension in his brother and sister.    ROS:  Please see the history of present illness. All other systems are reviewed and  Negative to the above problem except as noted.    PHYSICAL EXAM: VS:  BP 132/78   Pulse 68   Ht 6' (1.829 m)   Wt 85.9 kg (189 lb 6.4 oz)   SpO2 98%   BMI 25.69 kg/m   GEN: Well nourished, well developed, in no acute distress  HEENT: normal  Neck: no JVD, carotid bruits, or masses Cardiac: RRR; no murmurs, rubs, or gallops,no edema  Respiratory:  clear to auscultation bilaterally, normal work of breathing GI: soft, nontender, nondistended, + BS  No hepatomegaly  MS: no deformity Moving all extremities   Skin: warm and dry, no rash Neuro:  Strength and sensation are intact Psych: euthymic mood, full affect   EKG:  EKG is not ordered today.   Lipid Panel    Component Value Date/Time   CHOL 268 (H) 12/17/2016 1120   TRIG 130 12/17/2016 1120   HDL 50 12/17/2016 1120   CHOLHDL 5.4 (H) 12/17/2016 1120   VLDL 26 12/17/2016 1120   LDLCALC  192 (H) 12/17/2016 1120      Wt Readings from Last 3 Encounters:  06/10/17 85.9 kg (189 lb 6.4 oz)  05/29/17 86.2 kg (190 lb)  05/17/17 87 kg (191 lb 12.8 oz)      ASSESSMENT AND PLAN:  1  CAD  Recovering well from surgery  Will check BMET today    2  CHF  Volume status is OK  I would not push meds now  Check labs   Pt will need reassessment of LVEF after revascularization  Set up for echo later this summer  3  HL  Will check lpids   4  NSVT  On amio  Check labs     I will set to see the pt this  fall  Current medicines are reviewed at length with the patient today.  The patient does not have concerns regarding medicines.  Signed, Dietrich Pates, MD  06/10/2017 12:05 PM    [facility] [address] [place], [place], [place]  [address] Phone: [phone]; Fax: [phone]

## 2017-06-10 ENCOUNTER — Ambulatory Visit (INDEPENDENT_AMBULATORY_CARE_PROVIDER_SITE_OTHER): Payer: Medicaid Other | Admitting: Internal Medicine

## 2017-06-10 ENCOUNTER — Encounter (INDEPENDENT_AMBULATORY_CARE_PROVIDER_SITE_OTHER): Payer: Self-pay

## 2017-06-10 ENCOUNTER — Encounter: Payer: Self-pay | Admitting: Internal Medicine

## 2017-06-10 VITALS — BP 132/78 | HR 68 | Ht 72.0 in | Wt 189.4 lb

## 2017-06-10 DIAGNOSIS — E7800 Pure hypercholesterolemia, unspecified: Secondary | ICD-10-CM | POA: Diagnosis not present

## 2017-06-10 DIAGNOSIS — I472 Ventricular tachycardia: Secondary | ICD-10-CM

## 2017-06-10 DIAGNOSIS — I5022 Chronic systolic (congestive) heart failure: Secondary | ICD-10-CM

## 2017-06-10 DIAGNOSIS — I4729 Other ventricular tachycardia: Secondary | ICD-10-CM

## 2017-06-10 DIAGNOSIS — I2510 Atherosclerotic heart disease of native coronary artery without angina pectoris: Secondary | ICD-10-CM | POA: Diagnosis not present

## 2017-06-10 LAB — BASIC METABOLIC PANEL
BUN/Creatinine Ratio: 8 — ABNORMAL LOW (ref 9–20)
BUN: 10 mg/dL (ref 6–24)
CALCIUM: 8.6 mg/dL — AB (ref 8.7–10.2)
CHLORIDE: 100 mmol/L (ref 96–106)
CO2: 23 mmol/L (ref 20–29)
Creatinine, Ser: 1.25 mg/dL (ref 0.76–1.27)
GFR calc Af Amer: 76 mL/min/{1.73_m2} (ref >59)
GFR, EST NON AFRICAN AMERICAN: 66 mL/min/{1.73_m2} (ref >59)
Glucose: 167 mg/dL — ABNORMAL HIGH (ref 65–99)
POTASSIUM: 4.1 mmol/L (ref 3.5–5.2)
Sodium: 135 mmol/L (ref 134–144)

## 2017-06-10 LAB — LIPID PANEL
Chol/HDL Ratio: 2.9 ratio (ref 0.0–5.0)
Cholesterol, Total: 138 mg/dL (ref 100–199)
HDL: 47 mg/dL (ref >39)
LDL Calculated: 71 mg/dL (ref 0–99)
TRIGLYCERIDES: 98 mg/dL (ref 0–149)
VLDL Cholesterol Cal: 20 mg/dL (ref 5–40)

## 2017-06-10 LAB — HEPATIC FUNCTION PANEL
ALT: 15 IU/L (ref 0–44)
AST: 16 IU/L (ref 0–40)
Albumin: 3.9 g/dL (ref 3.5–5.5)
Alkaline Phosphatase: 100 IU/L (ref 39–117)
BILIRUBIN TOTAL: 0.3 mg/dL (ref 0.0–1.2)
BILIRUBIN, DIRECT: 0.08 mg/dL (ref 0.00–0.40)
Total Protein: 6.5 g/dL (ref 6.0–8.5)

## 2017-06-10 LAB — TSH: TSH: 1.27 u[IU]/mL (ref 0.450–4.500)

## 2017-06-10 NOTE — Patient Instructions (Signed)
Your physician recommends that you continue on your current medications as directed. Please refer to the Current Medication list given to you today.  Your physician recommends that you return for lab work in: today (BMET, TSH, LIPIDS/LIVER FUNCTION)  Your physician wants you to follow-up AT THE END OF September WITH DR. Tenny Craw.  You will receive a reminder letter in the mail two months in advance. If you don't receive a letter, please call our office to schedule the follow-up appointment.

## 2017-06-18 ENCOUNTER — Telehealth (HOSPITAL_COMMUNITY): Payer: Self-pay

## 2017-06-18 NOTE — Telephone Encounter (Signed)
Referral canceled for cardiac rehab. Patient has not responded to phone calls and letter. °

## 2017-06-21 ENCOUNTER — Ambulatory Visit: Payer: Medicaid Other | Admitting: Internal Medicine

## 2017-07-10 ENCOUNTER — Encounter: Payer: Medicaid Other | Admitting: Cardiothoracic Surgery

## 2017-07-17 ENCOUNTER — Encounter: Payer: Medicaid Other | Admitting: Cardiothoracic Surgery

## 2017-07-21 ENCOUNTER — Other Ambulatory Visit: Payer: Self-pay | Admitting: Physician Assistant

## 2017-07-25 ENCOUNTER — Other Ambulatory Visit: Payer: Self-pay

## 2017-07-25 ENCOUNTER — Encounter: Payer: Self-pay | Admitting: Cardiothoracic Surgery

## 2017-07-25 ENCOUNTER — Ambulatory Visit (HOSPITAL_COMMUNITY): Payer: Medicaid Other | Attending: Cardiology

## 2017-07-25 DIAGNOSIS — I5030 Unspecified diastolic (congestive) heart failure: Secondary | ICD-10-CM | POA: Diagnosis not present

## 2017-07-25 DIAGNOSIS — I088 Other rheumatic multiple valve diseases: Secondary | ICD-10-CM | POA: Insufficient documentation

## 2017-07-25 DIAGNOSIS — I255 Ischemic cardiomyopathy: Secondary | ICD-10-CM | POA: Diagnosis present

## 2017-07-25 DIAGNOSIS — I422 Other hypertrophic cardiomyopathy: Secondary | ICD-10-CM | POA: Insufficient documentation

## 2017-07-25 LAB — ECHOCARDIOGRAM COMPLETE
AOASC: 34 cm
CHL CUP MV DEC (S): 169
CHL CUP PV REG GRAD DIAS: 9 mmHg
E/e' ratio: 19.44
EWDT: 169 ms
FS: 22 % — AB (ref 28–44)
IVS/LV PW RATIO, ED: 0.95
LA diam end sys: 42 mm
LA vol index: 25.9 mL/m2
LADIAMINDEX: 2.02 cm/m2
LASIZE: 42 mm
LAVOL: 53.8 mL
LAVOLA4C: 48.8 mL
LV E/e' medial: 19.44
LV TDI E'LATERAL: 5.4
LV TDI E'MEDIAL: 6.12
LV e' LATERAL: 5.4 cm/s
LVEEAVG: 19.44
LVOT SV: 67 mL
LVOT VTI: 17.5 cm
LVOT area: 3.8 cm2
LVOT peak vel: 103 cm/s
LVOTD: 22 mm
MV pk E vel: 105 m/s
MVPG: 4 mmHg
MVPKAVEL: 69.4 m/s
PV Reg vel dias: 146 cm/s
PW: 11.8 mm — AB (ref 0.6–1.1)
RV LATERAL S' VELOCITY: 7.48 cm/s
TAPSE: 16.3 mm

## 2017-07-26 ENCOUNTER — Encounter: Payer: Self-pay | Admitting: Physician Assistant

## 2017-07-30 ENCOUNTER — Telehealth: Payer: Self-pay | Admitting: *Deleted

## 2017-07-30 ENCOUNTER — Other Ambulatory Visit: Payer: Self-pay | Admitting: Surgical

## 2017-07-30 DIAGNOSIS — I2510 Atherosclerotic heart disease of native coronary artery without angina pectoris: Secondary | ICD-10-CM

## 2017-07-30 DIAGNOSIS — I5042 Chronic combined systolic (congestive) and diastolic (congestive) heart failure: Secondary | ICD-10-CM

## 2017-07-30 DIAGNOSIS — Z951 Presence of aortocoronary bypass graft: Secondary | ICD-10-CM

## 2017-07-30 NOTE — Telephone Encounter (Signed)
Pt has been notified of echo results and findings by phone with verbal understanding. Pt agreeable to referral to EP to discuss possible ICD. Pt aware EF 25-30% is unchanged since his bypass. Pt aware Glynda Jaeger, EP Scheduler will call him with an appt to see on of the EP dr. Pt thanked me for my time and my call today. Pt states he does not have a PCP at this time.

## 2017-08-07 ENCOUNTER — Institutional Professional Consult (permissible substitution): Payer: Medicaid Other | Admitting: Internal Medicine

## 2017-08-14 ENCOUNTER — Encounter: Payer: Self-pay | Admitting: Cardiothoracic Surgery

## 2017-08-14 ENCOUNTER — Ambulatory Visit (INDEPENDENT_AMBULATORY_CARE_PROVIDER_SITE_OTHER): Payer: Medicaid Other | Admitting: Cardiothoracic Surgery

## 2017-08-14 ENCOUNTER — Other Ambulatory Visit: Payer: Self-pay | Admitting: Surgical

## 2017-08-14 VITALS — BP 111/78 | HR 68 | Resp 20 | Ht 72.0 in | Wt 192.0 lb

## 2017-08-14 DIAGNOSIS — Z951 Presence of aortocoronary bypass graft: Secondary | ICD-10-CM | POA: Diagnosis not present

## 2017-08-14 NOTE — Progress Notes (Signed)
PCP is Patient, No Pcp Per Referring Provider is Pricilla Riffle, MD  Chief Complaint  Patient presents with  . Routine Post Op    6 week f/u HX of CABG    HPI: Final postop visit 3 months after emergency CABG with preoperative balloon pump. The patient presented with a completed MI and EF 25%. He underwent CABG 3 and did well postop. The patient has no symptoms of angina or CHF. A follow-up echo last month showed EF remains at 25-30 percent. The patient did not enter cardiac rehabilitation program--he states he was never called. We will arrange that now. His exercise tolerance has significantly improved after surgery. Last chest x-ray is clear and surgical incisions are well-healed. He is on appropriate medications for ischemic cardiomyopathy and understands his lifting restrictions following sternotomy are now released. Past Medical History:  Diagnosis Date  . CAD (coronary artery disease) 2016   a. NSTEMI 5/16 s/p DES to pLAD and dRCA // b. NSTEMI 4/18 >> 95% LM, oLAD stent ok, oLCx 80, mRCA stent ok, EF < 25 >> IABP // c. s/p CABG with Dr. Zenaida Niece Trig (L-LAD, S-OM, S-PDA)  . Diabetes mellitus   . GERD (gastroesophageal reflux disease)   . HLD (hyperlipidemia)   . Hypertension   . Ischemic cardiomyopathy    a. 2D ECHO 05/02/15 with EF 25-30%, mild LV dilation. Mild LVH, mod HKof the mid-apicalanteroseptal myocardium, G1DD // b. Mild LVH, EF 25, diffuse HK, grade 1 diastolic dysfunction, trivial MR // Echo 7/18: Mild concentric LVH, EF 25-30, inferior and inferolateral AK, grade 2 diastolic dysfunction, trivial MR/PI   . Tobacco abuse     Past Surgical History:  Procedure Laterality Date  . CARDIAC CATHETERIZATION N/A 05/03/2015   Procedure: Right/Left Heart Cath and Coronary Angiography;  Surgeon: Peter M Swaziland, MD;  Location[facility];  Service: Cardiovascular;  Laterality: N/A;  . CARDIAC CATHETERIZATION N/A 05/03/2015   Procedure: Intravascular Ultrasound/IVUS;   Surgeon: Peter M Swaziland, MD;  Location: [facility];  Service: Cardiovascular;  Laterality: N/A;  . CARDIAC CATHETERIZATION N/A 05/03/2015   Procedure: Coronary Stent Intervention;  Surgeon: Peter M Swaziland, MD;  Location: [facility];  Service: Cardiovascular;  Laterality: N/A;  rca  . CORONARY ARTERY BYPASS GRAFT N/A 04/24/2017   Procedure: CORONARY ARTERY BYPASS GRAFTING (CABG) times three using left intenal mammary artery and right saphenous vein;  Surgeon: Kerin Perna, MD;  Location: [facility] OR;  Service: Open Heart Surgery;  Laterality: N/A;  . IABP INSERTION N/A 04/24/2017   Procedure: IABP Insertion;  Surgeon: Marykay Lex, MD;  Location: [facility] INVASIVE CV LAB;  Service: Cardiovascular;  Laterality: N/A;  . LEFT HEART CATH AND CORONARY ANGIOGRAPHY N/A 04/24/2017   Procedure: Left Heart Cath and Coronary Angiography;  Surgeon: Marykay Lex, MD;  Location: [facility] INVASIVE CV LAB;  Service: Cardiovascular;  Laterality: N/A;  . PERCUTANEOUS CORONARY STENT INTERVENTION (PCI-S)  05/03/2015   Procedure: Percutaneous Coronary Stent Intervention (Pci-S);  Surgeon: Peter M Swaziland, MD;  Location: [facility] INVASIVE CV LAB CUPID;  Service: Cardiovascular;;  Prox LAD  . TEE WITHOUT CARDIOVERSION N/A 04/24/2017   Procedure: TRANSESOPHAGEAL ECHOCARDIOGRAM (TEE);  Surgeon: Kerin Perna, MD;  Location: [facility] OR;  Service: Open Heart Surgery;  Laterality: N/A;  . WISDOM TOOTH EXTRACTION     bottom LT, top RT    Family History  Problem Relation Age of Onset  . Breast cancer Mother   . Heart  disease Father   . Diabetes Sister   . Hypertension Sister   . Hypertension Brother   . Diabetes Brother     Social History Social History  Substance Use Topics  . Smoking status: Current Some Day Smoker    Packs/day: 0.10    Types: Cigarettes    Last attempt to quit: 05/03/2015  . Smokeless tobacco: Never Used  . Alcohol use No     Comment: very occasionally    Current Outpatient Prescriptions   Medication Sig Dispense Refill  . amiodarone (PACERONE) 200 MG tablet Take 1 tablet (200 mg total) by mouth daily. 90 tablet 3  . atorvastatin (LIPITOR) 80 MG tablet Take 1 tablet (80 mg total) by mouth daily. 90 tablet 3  . carvedilol (COREG) 12.5 MG tablet TAKE 1 TABLET BY MOUTH TWICE DAILY WITH A MEAL 180 tablet 2  . lisinopril (PRINIVIL,ZESTRIL) 10 MG tablet TAKE 1 TABLET BY MOUTH DAILY, MUST KEEP APPOINTMENT FOR ADDITIONAL REFILLS 90 tablet 1  . metFORMIN (GLUCOPHAGE) 1000 MG tablet Take 1 tablet (1,000 mg total) by mouth 2 (two) times daily with a meal. 60 tablet 1  . spironolactone (ALDACTONE) 25 MG tablet Take 0.5 tablets (12.5 mg total) by mouth daily. 90 tablet 3  . traMADol (ULTRAM) 50 MG tablet TAKE 1 TO 2 TABLETS BY MOUTH EVERY 6 HOURS AS NEEDED FOR MODERATE PAIN 30 tablet 0   No current facility-administered medications for this visit.     Allergies  Allergen Reactions  . Aspirin Hives and Other (See Comments)    Makes pt feel funny  . Other Nausea And Vomiting and Other (See Comments)    Grape products    Review of Systems  Feels well Doing yard work without difficulty Incisions clean and dry well-healed  BP 111/78   Pulse 68   Resp 20   Ht 6' (1.829 m)   Wt 192 lb (87.1 kg)   SpO2 97% Comment: RA  BMI 26.04 kg/m  Physical Exam        Exam    General- alert and comfortable   Lungs- clear without rales, wheezes   Cor- regular rate and rhythm, no murmur , gallop   Abdomen- soft, non-tender   Extremities - warm, non-tender, minimal edema   Neuro- oriented, appropriate, no focal weakness   Diagnostic Tests: None  Impression: Excellent recovery after emergency CABG 3  Plan: Continue current medications Completed cardiac rehabilitation No lifting restrictions or sternal precautions now 3 months postop  Mikey Bussing, MD [facility] [phone]

## 2017-08-14 NOTE — Addendum Note (Signed)
Addended by: Joycelyn Schmid on: 08/14/2017 05:21 PM   Modules accepted: Orders

## 2017-08-19 ENCOUNTER — Encounter: Payer: Self-pay | Admitting: Internal Medicine

## 2017-08-19 ENCOUNTER — Ambulatory Visit (INDEPENDENT_AMBULATORY_CARE_PROVIDER_SITE_OTHER): Payer: Medicaid Other | Admitting: Internal Medicine

## 2017-08-19 VITALS — BP 102/62 | HR 72 | Ht 72.0 in | Wt 193.2 lb

## 2017-08-19 DIAGNOSIS — I4729 Other ventricular tachycardia: Secondary | ICD-10-CM

## 2017-08-19 DIAGNOSIS — I255 Ischemic cardiomyopathy: Secondary | ICD-10-CM

## 2017-08-19 DIAGNOSIS — I5022 Chronic systolic (congestive) heart failure: Secondary | ICD-10-CM

## 2017-08-19 DIAGNOSIS — I472 Ventricular tachycardia: Secondary | ICD-10-CM | POA: Diagnosis not present

## 2017-08-19 NOTE — Patient Instructions (Addendum)
Medication Instructions:  Your physician recommends that you continue on your current medications as directed. Please refer to the Current Medication list given to you today.   Labwork: None ordered   Testing/Procedures: None ordered   Follow-Up: Your physician recommends that you schedule a follow-up appointment To be determined   Please call Dennis Bast, RN on Monday 08/26/17 [phone]   Any Other Special Instructions Will Be Listed Below (If Applicable).     If you need a refill on your cardiac medications before your next appointment, please call your pharmacy.

## 2017-08-19 NOTE — Progress Notes (Signed)
Electrophysiology Office Note   Date:  08/19/2017   ID:  Alan Diaz, DOB 04/22/1965, MRN [id number]  PCP:  Patient, No Pcp Per  Cardiologist:  Dr Tenny Craw Primary Electrophysiologist: Hillis Range, MD    CC: CHF   History of Present Illness: Alan Diaz is a 52 y.o. male who presents today for electrophysiology evaluation.   He is referred by Dr Tenny Craw for EP consultation to further risk stratify for sudden death. He has longstanding ischemic CM with prior PCI in 2016.  Most recently, he presented with NSTEMI and CHF in April.  He was found to have advanced CAD and underwent CABG by Dr Donata Clay.  He has made slow but steady recovery.  He reports that prior SOB is much improved. Of note, he had NSVT on telemetry in the hospital with associated palpitations.  He was placed on amiodarone.  He is tolerating this medicine well and his symptoms are resolved.   Today, he denies symptoms of chest pain, shortness of breath, orthopnea, PND, lower extremity edema, claudication, dizziness, presyncope, syncope, bleeding, or neurologic sequela. The patient is tolerating medications without difficulties and is otherwise without complaint today.    Past Medical History:  Diagnosis Date  . CAD (coronary artery disease) 2016   a. NSTEMI 5/16 s/p DES to pLAD and dRCA // b. NSTEMI 4/18 >> 95% LM, oLAD stent ok, oLCx 80, mRCA stent ok, EF < 25 >> IABP // c. s/p CABG with Dr. Zenaida Niece Trig (L-LAD, S-OM, S-PDA)  . Diabetes mellitus   . GERD (gastroesophageal reflux disease)   . HLD (hyperlipidemia)   . Hypertension   . Ischemic cardiomyopathy    a. 2D ECHO 05/02/15 with EF 25-30%, mild LV dilation. Mild LVH, mod HKof the mid-apicalanteroseptal myocardium, G1DD // b. Mild LVH, EF 25, diffuse HK, grade 1 diastolic dysfunction, trivial MR // Echo 7/18: Mild concentric LVH, EF 25-30, inferior and inferolateral AK, grade 2 diastolic dysfunction, trivial MR/PI   . Tobacco abuse    Past Surgical History:    Procedure Laterality Date  . CARDIAC CATHETERIZATION N/A 05/03/2015   Procedure: Right/Left Heart Cath and Coronary Angiography;  Surgeon: Peter M Swaziland, MD;  Location: [facility];  Service: Cardiovascular;  Laterality: N/A;  . CARDIAC CATHETERIZATION N/A 05/03/2015   Procedure: Intravascular Ultrasound/IVUS;  Surgeon: Peter M Swaziland, MD;  Location: [facility];  Service: Cardiovascular;  Laterality: N/A;  . CARDIAC CATHETERIZATION N/A 05/03/2015   Procedure: Coronary Stent Intervention;  Surgeon: Peter M Swaziland, MD;  Location: [facility];  Service: Cardiovascular;  Laterality: N/A;  rca  . CORONARY ARTERY BYPASS GRAFT N/A 04/24/2017   Procedure: CORONARY ARTERY BYPASS GRAFTING (CABG) times three using left intenal mammary artery and right saphenous vein;  Surgeon: Kerin Perna, MD;  Location: [facility] OR;  Service: Open Heart Surgery;  Laterality: N/A;  . IABP INSERTION N/A 04/24/2017   Procedure: IABP Insertion;  Surgeon: Marykay Lex, MD;  Location: [facility] INVAS[facility] CV LAB;  Service: Cardiovascular;  Laterality: N/A;  . LEFT HEART CATH AND CORONARY ANGIOGRAPHY N/A 04/24/2017   Procedure: Left Heart Cath and Coronary Angiography;  Surgeon: Marykay Lex, MD;  Location: [facility] INVASIVE CV LAB;  Service: Cardiovascular;  Laterality: N/A;  . PERCUTANEOUS CORONARY STENT INTERVENTION (PCI-S)  05/03/2015   Procedure: Percutaneous Coronary Stent Intervention (Pci-S);  Surgeon: Peter M Swaziland, MD;  Location: [facility] INVASIVE CV LAB [facility]UP[facility];  Service: Cardiovascular;;  Prox LAD  . TEE  WITHOUT CARDIOVERSION N/A 04/24/2017   Procedure: TRANSESOPHAGEAL ECHOCARDIOGRAM (TEE);  Surgeon: Kerin Perna, MD;  Location: [facility] OR;  Service: Open Heart Surgery;  Laterality: N/A;  . WISDOM TOOTH EXTRACTION     bottom LT, top RT     Current Outpatient Prescriptions  Medication Sig Dispense Refill  . amiodarone (PACERONE) 200 MG tablet Take 1 tablet (200 mg total) by mouth daily. 90 tablet 3  .  atorvastatin (LIPITOR) 80 MG tablet Take 1 tablet (80 mg total) by mouth daily. 90 tablet 3  . carvedilol (COREG) 12.5 MG tablet TAKE 1 TABLET BY MOUTH TWICE DAILY WITH A MEAL 180 tablet 2  . lisinopril (PRINIVIL,ZESTRIL) 10 MG tablet TAKE 1 TABLET BY MOUTH DAILY, MUST KEEP APPOINTMENT FOR ADDITIONAL REFILLS 90 tablet 1  . metFORMIN (GLUCOPHAGE) 1000 MG tablet Take 1 tablet (1,000 mg total) by mouth 2 (two) times daily with a meal. 60 tablet 1  . spironolactone (ALDACTONE) 25 MG tablet Take 0.5 tablets (12.5 mg total) by mouth daily. 90 tablet 3  . traMADol (ULTRAM) 50 MG tablet TAKE 1 TO 2 TABLETS BY MOUTH EVERY 6 HOURS AS NEEDED FOR MODERATE PAIN 30 tablet 0   No current facility-administered medications for this visit.     Allergies:   Aspirin and Other   Social History:  The patient  reports that he has been smoking Cigarettes.  He has been smoking about 0.10 packs per day. He has never used smokeless tobacco. He reports that he does not drink alcohol or use drugs.   Family History:  The patient's  family history includes Breast cancer in his mother; Diabetes in his brother and sister; Heart disease in his father; Hypertension in his brother and sister.    ROS:  Please see the history of present illness.   All other systems are personally reviewed and negative.    PHYSICAL EXAM: VS:  BP 102/62   Pulse 72   Ht 6' (1.829 m)   Wt 193 lb 3.2 oz (87.6 kg)   SpO2 97%   BMI 26.20 kg/m  , BMI Body mass index is 26.2 kg/m. GEN: Well nourished, well developed, in no acute distress  HEENT: normal  Neck: no JVD, carotid bruits, or masses Cardiac: RRR; no murmurs, rubs, or gallops,no edema  Respiratory:  clear to auscultation bilaterally, normal work of breathing GI: soft, nontender, nondistended, + BS MS: no deformity or atrophy  Skin: warm and dry  Neuro:  Strength and sensation are intact Psych: euthymic mood, full affect  EKG:  EKG is ordered today. The ekg ordered today is  personally reviewed and shows sinus rhythm 65 bpm, LVH, QRS 116 msec, diffuse ST depression   Recent Labs: 04/22/2017: B Natriuretic Peptide 960.3 04/27/2017: Magnesium 1.7 04/30/2017: Hemoglobin 10.8; Platelets 307 06/10/2017: ALT 15; BUN 10; Creatinine, Ser 1.25; Potassium 4.1; Sodium 135; TSH 1.270  personally reviewed   Lipid Panel     Component Value Date/Time   CHOL 138 06/10/2017 1219   TRIG 98 06/10/2017 1219   HDL 47 06/10/2017 1219   CHOLHDL 2.9 06/10/2017 1219   CHOLHDL 5.4 (H) 12/17/2016 1120   VLDL 26 12/17/2016 1120   LDLCALC 71 06/10/2017 1219   personally reviewed   Wt Readings from Last 3 Encounters:  08/19/17 193 lb 3.2 oz (87.6 kg)  08/14/17 192 lb (87.1 kg)  06/10/17 189 lb 6.4 oz (85.9 kg)      Other studies personally reviewed: Additional studies/ records that were reviewed today include:  prior hospital records. Dr Tenny Craw' notes, prior echo,  Review of the above records today demonstrates: as above    ASSESSMENT AND PLAN:  1.  ishcemic CM/ chronic systolic dysfunction/ NSVT The patient has an ischemic CM (EF 25%), NYHA Class II/III CHF, and CAD. At this time, he meets MADIT II/ SCD-HeFT criteria for ICD implantation for primary prevention of sudden death.  He has QRS < 130 msec and therefore does not meet criteria for CRT.  Risks, benefits, alternatives to ICD implantation were discussed in detail with the patient today. The patient  understands that the risks include but are not limited to bleeding, infection, pneumothorax, perforation, tamponade, vascular damage, renal failure, MI, stroke, death, inappropriate shocks, and lead dislodgement and wishes to think about this further.  He is not sure that he is ready to proceed but will contact our office if he decides to have ICD implantation.   I think that it would be best to get him off of amiodarone long term.  Ideally, this medicine would be stopped at time of ICD implantation.  If he decides to not proceed with  an ICD, amiodarone can be discontinued by Dr Tenny Craw.   Current medicines are reviewed at length with the patient today.   The patient does not have concerns regarding his medicines.  The following changes were made today:  none  Labs/ tests ordered today include:  Orders Placed This Encounter  Procedures  . EKG 12-Lead     Signed, Hillis Range, MD  08/19/2017 5:01 PM     [facility] [address] [place] [address] [phone] (office) [phone] (fax)

## 2017-08-21 ENCOUNTER — Telehealth: Payer: Self-pay | Admitting: Internal Medicine

## 2017-08-21 NOTE — Telephone Encounter (Signed)
Will forward to Dr Ross for review ./cy 

## 2017-08-21 NOTE — Telephone Encounter (Signed)
Alan Diaz is calling because the insurance will not cover medication ( Amiodarone 200mg ) Dr. Tenny Craw changed it from 100mg  twice a day to 200mg  once a day .Wants to can it be changed back to 100 mg twice a day . Please call

## 2017-08-21 NOTE — Telephone Encounter (Signed)
OK to take 100 bid

## 2017-08-22 NOTE — Addendum Note (Signed)
Addendum  created 08/22/17 1332 by Marishka Rentfrow, MD   Sign clinical note    

## 2017-08-23 ENCOUNTER — Encounter (HOSPITAL_COMMUNITY): Payer: Self-pay

## 2017-08-23 ENCOUNTER — Telehealth (HOSPITAL_COMMUNITY): Payer: Self-pay

## 2017-08-23 NOTE — Telephone Encounter (Signed)
I called and left message on voicemail to call office about scheduling for cardiac rehab. I left office contact information on patient voicemail to return call.  ° °

## 2017-08-23 NOTE — Telephone Encounter (Signed)
Spoke to patient and then to [facility], [employer]   They said it worked itself out.  [facility] Spaniel they were told insurance would not cover, but they went to pick up medicines and the amiodarone 200 mg daily was covered.  Nothing needs to be changed.  Advised to call back in future for any concerns.

## 2017-09-12 ENCOUNTER — Emergency Department (HOSPITAL_COMMUNITY): Payer: Medicaid Other

## 2017-09-12 ENCOUNTER — Inpatient Hospital Stay (HOSPITAL_COMMUNITY)
Admission: EM | Admit: 2017-09-12 | Discharge: 2017-09-14 | DRG: 291 | Disposition: A | Discharge status: Home / Self Care | Payer: Medicaid Other | Attending: Internal Medicine | Admitting: Internal Medicine

## 2017-09-12 ENCOUNTER — Encounter (HOSPITAL_COMMUNITY): Payer: Self-pay

## 2017-09-12 DIAGNOSIS — Z955 Presence of coronary angioplasty implant and graft: Secondary | ICD-10-CM

## 2017-09-12 DIAGNOSIS — E785 Hyperlipidemia, unspecified: Secondary | ICD-10-CM | POA: Diagnosis present

## 2017-09-12 DIAGNOSIS — Z79899 Other long term (current) drug therapy: Secondary | ICD-10-CM

## 2017-09-12 DIAGNOSIS — Z9119 Patient's noncompliance with other medical treatment and regimen: Secondary | ICD-10-CM

## 2017-09-12 DIAGNOSIS — J181 Lobar pneumonia, unspecified organism: Secondary | ICD-10-CM

## 2017-09-12 DIAGNOSIS — I2510 Atherosclerotic heart disease of native coronary artery without angina pectoris: Secondary | ICD-10-CM

## 2017-09-12 DIAGNOSIS — R1084 Generalized abdominal pain: Secondary | ICD-10-CM

## 2017-09-12 DIAGNOSIS — I130 Hypertensive heart and chronic kidney disease with heart failure and stage 1 through stage 4 chronic kidney disease, or unspecified chronic kidney disease: Principal | ICD-10-CM | POA: Diagnosis present

## 2017-09-12 DIAGNOSIS — F17210 Nicotine dependence, cigarettes, uncomplicated: Secondary | ICD-10-CM | POA: Diagnosis present

## 2017-09-12 DIAGNOSIS — N183 Chronic kidney disease, stage 3 unspecified: Secondary | ICD-10-CM | POA: Diagnosis present

## 2017-09-12 DIAGNOSIS — E1159 Type 2 diabetes mellitus with other circulatory complications: Secondary | ICD-10-CM

## 2017-09-12 DIAGNOSIS — N200 Calculus of kidney: Secondary | ICD-10-CM | POA: Diagnosis present

## 2017-09-12 DIAGNOSIS — K219 Gastro-esophageal reflux disease without esophagitis: Secondary | ICD-10-CM | POA: Diagnosis present

## 2017-09-12 DIAGNOSIS — I5043 Acute on chronic combined systolic (congestive) and diastolic (congestive) heart failure: Secondary | ICD-10-CM | POA: Diagnosis present

## 2017-09-12 DIAGNOSIS — R7989 Other specified abnormal findings of blood chemistry: Secondary | ICD-10-CM

## 2017-09-12 DIAGNOSIS — R197 Diarrhea, unspecified: Secondary | ICD-10-CM

## 2017-09-12 DIAGNOSIS — R0602 Shortness of breath: Secondary | ICD-10-CM

## 2017-09-12 DIAGNOSIS — Z833 Family history of diabetes mellitus: Secondary | ICD-10-CM

## 2017-09-12 DIAGNOSIS — Z79891 Long term (current) use of opiate analgesic: Secondary | ICD-10-CM

## 2017-09-12 DIAGNOSIS — E1122 Type 2 diabetes mellitus with diabetic chronic kidney disease: Secondary | ICD-10-CM | POA: Diagnosis present

## 2017-09-12 DIAGNOSIS — Z951 Presence of aortocoronary bypass graft: Secondary | ICD-10-CM

## 2017-09-12 DIAGNOSIS — I252 Old myocardial infarction: Secondary | ICD-10-CM

## 2017-09-12 DIAGNOSIS — J159 Unspecified bacterial pneumonia: Secondary | ICD-10-CM | POA: Diagnosis present

## 2017-09-12 DIAGNOSIS — Z8249 Family history of ischemic heart disease and other diseases of the circulatory system: Secondary | ICD-10-CM

## 2017-09-12 DIAGNOSIS — J189 Pneumonia, unspecified organism: Secondary | ICD-10-CM

## 2017-09-12 DIAGNOSIS — R112 Nausea with vomiting, unspecified: Secondary | ICD-10-CM

## 2017-09-12 DIAGNOSIS — I4729 Other ventricular tachycardia: Secondary | ICD-10-CM

## 2017-09-12 DIAGNOSIS — I10 Essential (primary) hypertension: Secondary | ICD-10-CM

## 2017-09-12 DIAGNOSIS — Z7984 Long term (current) use of oral hypoglycemic drugs: Secondary | ICD-10-CM

## 2017-09-12 DIAGNOSIS — Z91018 Allergy to other foods: Secondary | ICD-10-CM

## 2017-09-12 DIAGNOSIS — I255 Ischemic cardiomyopathy: Secondary | ICD-10-CM

## 2017-09-12 DIAGNOSIS — I472 Ventricular tachycardia: Secondary | ICD-10-CM | POA: Diagnosis present

## 2017-09-12 DIAGNOSIS — Z803 Family history of malignant neoplasm of breast: Secondary | ICD-10-CM

## 2017-09-12 DIAGNOSIS — Z886 Allergy status to analgesic agent status: Secondary | ICD-10-CM

## 2017-09-12 DIAGNOSIS — R0600 Dyspnea, unspecified: Secondary | ICD-10-CM

## 2017-09-12 LAB — COMPREHENSIVE METABOLIC PANEL
ALK PHOS: 93 U/L (ref 38–126)
ALT: 20 U/L (ref 17–63)
ANION GAP: 7 (ref 5–15)
AST: 24 U/L (ref 15–41)
Albumin: 3.7 g/dL (ref 3.5–5.0)
BILIRUBIN TOTAL: 0.6 mg/dL (ref 0.3–1.2)
BUN: 13 mg/dL (ref 6–20)
CALCIUM: 8.8 mg/dL — AB (ref 8.9–10.3)
CO2: 23 mmol/L (ref 22–32)
Chloride: 107 mmol/L (ref 101–111)
Creatinine, Ser: 1.4 mg/dL — ABNORMAL HIGH (ref 0.61–1.24)
GFR, EST NON AFRICAN AMERICAN: 56 mL/min — AB (ref >60)
GLUCOSE: 221 mg/dL — AB (ref 65–99)
Potassium: 4 mmol/L (ref 3.5–5.1)
Sodium: 137 mmol/L (ref 135–145)
TOTAL PROTEIN: 7.3 g/dL (ref 6.5–8.1)

## 2017-09-12 LAB — CBC
HCT: 39.5 % (ref 39.0–52.0)
HEMOGLOBIN: 13.4 g/dL (ref 13.0–17.0)
MCH: 31.3 pg (ref 26.0–34.0)
MCHC: 33.9 g/dL (ref 30.0–36.0)
MCV: 92.3 fL (ref 78.0–100.0)
Platelets: 229 10*3/uL (ref 150–400)
RBC: 4.28 MIL/uL (ref 4.22–5.81)
RDW: 16.6 % — ABNORMAL HIGH (ref 11.5–15.5)
WBC: 5.3 10*3/uL (ref 4.0–10.5)

## 2017-09-12 LAB — LIPASE, BLOOD: Lipase: 24 U/L (ref 11–51)

## 2017-09-12 LAB — BRAIN NATRIURETIC PEPTIDE: B Natriuretic Peptide: 1385.3 pg/mL — ABNORMAL HIGH (ref 0.0–100.0)

## 2017-09-12 LAB — I-STAT TROPONIN, ED: TROPONIN I, POC: 0 ng/mL (ref 0.00–0.08)

## 2017-09-12 MED ORDER — ONDANSETRON 4 MG PO TBDP
ORAL_TABLET | ORAL | Status: AC
  Filled 2017-09-12: qty 1

## 2017-09-12 MED ORDER — DEXTROSE 5 % IV SOLN
1.0000 g | Freq: Once | INTRAVENOUS | Status: AC
  Administered 2017-09-12: 1 g via INTRAVENOUS
  Filled 2017-09-12: qty 10

## 2017-09-12 MED ORDER — ONDANSETRON 4 MG PO TBDP
4.0000 mg | ORAL_TABLET | Freq: Once | ORAL | Status: AC | PRN
  Administered 2017-09-12: 4 mg via ORAL

## 2017-09-12 MED ORDER — ONDANSETRON HCL 4 MG/2ML IJ SOLN
INTRAMUSCULAR | Status: AC
  Filled 2017-09-12: qty 2

## 2017-09-12 MED ORDER — METOCLOPRAMIDE HCL 5 MG/ML IJ SOLN
10.0000 mg | Freq: Once | INTRAMUSCULAR | Status: AC
  Administered 2017-09-12: 10 mg via INTRAVENOUS
  Filled 2017-09-12: qty 2

## 2017-09-12 MED ORDER — IPRATROPIUM BROMIDE 0.02 % IN SOLN
0.5000 mg | Freq: Once | RESPIRATORY_TRACT | Status: AC
  Administered 2017-09-12: 0.5 mg via RESPIRATORY_TRACT
  Filled 2017-09-12: qty 2.5

## 2017-09-12 MED ORDER — MORPHINE SULFATE (PF) 4 MG/ML IV SOLN
4.0000 mg | Freq: Once | INTRAVENOUS | Status: AC
  Administered 2017-09-12: 4 mg via INTRAVENOUS
  Filled 2017-09-12: qty 1

## 2017-09-12 MED ORDER — DIPHENHYDRAMINE HCL 50 MG/ML IJ SOLN
25.0000 mg | Freq: Once | INTRAMUSCULAR | Status: AC
  Administered 2017-09-12: 25 mg via INTRAVENOUS
  Filled 2017-09-12: qty 1

## 2017-09-12 MED ORDER — DEXTROSE 5 % IV SOLN
500.0000 mg | Freq: Once | INTRAVENOUS | Status: AC
  Administered 2017-09-13: 500 mg via INTRAVENOUS
  Filled 2017-09-12: qty 500

## 2017-09-12 MED ORDER — ALBUTEROL SULFATE (2.5 MG/3ML) 0.083% IN NEBU
5.0000 mg | INHALATION_SOLUTION | Freq: Once | RESPIRATORY_TRACT | Status: AC
  Administered 2017-09-12: 5 mg via RESPIRATORY_TRACT
  Filled 2017-09-12: qty 6

## 2017-09-12 MED ORDER — LORAZEPAM 2 MG/ML IJ SOLN
1.0000 mg | Freq: Once | INTRAMUSCULAR | Status: AC
  Administered 2017-09-12: 1 mg via INTRAVENOUS
  Filled 2017-09-12: qty 1

## 2017-09-12 MED ORDER — PROMETHAZINE HCL 25 MG/ML IJ SOLN
25.0000 mg | Freq: Once | INTRAMUSCULAR | Status: AC
  Administered 2017-09-12: 25 mg via INTRAVENOUS
  Filled 2017-09-12: qty 1

## 2017-09-12 NOTE — ED Provider Notes (Signed)
[facility]-EMER[facility]ENCY DEPT Provider Note   CSN: [id number] Arrival date & time: 09/12/17  1859     History   Chief Complaint Chief Complaint  Patient presents with  . Abdominal Pain  . Shortness of Breath    HPI Alan Diaz is a 52 y.o. male with a PMHx of CAD/NSTEMI s/p CABG 04/24/2017, CHF, GERD, DM2, HTN, HLD, ischemic cardiomyopathy, NSVT on amiodarone, and CKD3, who presents to the ED with complaints of one day of shortness of breath, dry cough, 3 pillow orthopnea (up from 2), and wheezing, and generalized abdominal pain, nausea, vomiting, and diarrhea that began today. He states that yesterday he was feeling short of breath, thought that he "had some extra fluid on him", and then today developed worsening abdominal pain. He describes his abdominal pain is 10/10 constant throbbing nonradiating generalized abdominal pain with no known ring factors, and no treatments tried prior to arrival. He has had at least 12 episodes of nonbloody nonbilious emesis today, and 2 episodes of nonbloody watery diarrhea. He has not been able to take any medications, and has vomited all of his usual medications. He is usually compliant with all his blood pressure medications however today has not been able to keep any of them down. He does not have a PCP currently, but his cardiologist is Dr. Tenny Craw.  He denies fevers, chills, diaphoresis, lightheadedness, claudication, wt change, hemoptysis, CP, URI symptoms, LE swelling, recent travel/surgery/immobilization, personal/family hx of DVT/PE, constipation, obstipation, melena, hematochezia, hematemesis, hematuria, dysuria, back pain, myalgias, arthralgias, numbness, tingling, focal weakness, or any other complaints at this time. Denies recent travel, sick contacts, suspicious food intake, EtOH use, NSAID use, or prior abd surgeries.    The history is provided by the patient and medical records. No language interpreter was used.  Abdominal Pain   Associated symptoms  include diarrhea, nausea and vomiting. Pertinent negatives include fever, constipation, dysuria, hematuria, arthralgias and myalgias.  Shortness of Breath  This is a new problem. The average episode lasts 1 day. The problem occurs continuously.The current episode started yesterday. The problem has not changed since onset.Associated symptoms include cough, wheezing, orthopnea, vomiting and abdominal pain. Pertinent negatives include no fever, no rhinorrhea, no sore throat, no hemoptysis, no chest pain, no leg swelling and no claudication. It is unknown what precipitated the problem. He has tried nothing for the symptoms. The treatment provided no relief. Associated medical issues include CAD, heart failure and past MI. Associated medical issues do not include PE, DVT or recent surgery.    Past Medical History:  Diagnosis Date  . CAD (coronary artery disease) 2016   a. NSTEMI 5/16 s/p DES to pLAD and dRCA // b. NSTEMI 4/18 >> 95% LM, oLAD stent ok, oLCx 80, mRCA stent ok, EF < 25 >> IABP // c. s/p CABG with Dr. Zenaida Niece Trig (L-LAD, S-OM, S-PDA)  . Diabetes mellitus   . GERD (gastroesophageal reflux disease)   . HLD (hyperlipidemia)   . Hypertension   . Ischemic cardiomyopathy    a. 2D ECHO 05/02/15 with EF 25-30%, mild LV dilation. Mild LVH, mod HKof the mid-apicalanteroseptal myocardium, G1DD // b. Mild LVH, EF 25, diffuse HK, grade 1 diastolic dysfunction, trivial MR // Echo 7/18: Mild concentric LVH, EF 25-30, inferior and inferolateral AK, grade 2 diastolic dysfunction, trivial MR/PI   . Tobacco abuse     Patient Active Problem List   Diagnosis Date Noted  . NSVT (nonsustained ventricular tachycardia) (HCC) 05/17/2017  . 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  Left main coronary artery thrombosis (HCC)   . CKD (chronic kidney disease) stage 3, GFR 30-59 ml/min 04/23/2017  . Cardiomyopathy, ischemic 09/09/2016  . Non compliance w medication regimen 09/09/2016  . Elevated troponin 09/09/2016  . NSTEMI  (non-ST elevated myocardial infarction) (HCC) 05/05/2015  . GERD (gastroesophageal reflux disease)   . HLD (hyperlipidemia)   . CAD (coronary artery disease)   . Chronic combined systolic and diastolic CHF (congestive heart failure) (HCC) 05/03/2015  . Essential hypertension 05/03/2015  . TOBACCO ABUSE 01/19/2009  . Type 2 diabetes mellitus with circulatory disorder (HCC) 10/31/2008    Past Surgical History:  Procedure Laterality Date  . CARDIAC CATHETERIZATION N/A 05/03/2015   Procedure: Right/Left Heart Cath and Coronary Angiography;  Surgeon: Peter M Swaziland, MD;  Location: [facility];  Service: Cardiovascular;  Laterality: N/A;  . CARDIAC CATHETERIZATION N/A 05/03/2015   Procedure: Intravascular Ultrasound/IVUS;  Surgeon: Peter M Swaziland, MD;  Location: [facility];  Service: Cardiovascular;  Laterality: N/A;  . CARDIAC CATHETERIZATION N/A 05/03/2015   Procedure: Coronary Stent Intervention;  Surgeon: Peter M Swaziland, MD;  Location: [facility];  Service: Cardiovascular;  Laterality: N/A;  rca  . CORONARY ARTERY BYPASS GRAFT N/A 04/24/2017   Procedure: CORONARY ARTERY BYPASS GRAFTING (CABG) times three using left intenal mammary artery and right saphenous vein;  Surgeon: Kerin Perna, MD;  Location: [facility] OR;  Service: Open Heart Surgery;  Laterality: N/A;  . IABP INSERTION N/A 04/24/2017   Procedure: IABP Insertion;  Surgeon: Marykay Lex, MD;  Location: [facility] INVASIVE CV LAB;  Service: Cardiovascular;  Laterality: N/A;  . LEFT HEART CATH AND CORONARY ANGIOGRAPHY N/A 04/24/2017   Procedure: Left Heart Cath and Coronary Angiography;  Surgeon: Marykay Lex, MD;  Location: [facility] INVASIVE CV LAB;  Service: Cardiovascular;  Laterality: N/A;  . PERCUTANEOUS CORONARY STENT INTERVENTION (PCI-S)  05/03/2015   Procedure: Percutaneous Coronary Stent Intervention (Pci-S);  Surgeon: Peter M Swaziland, MD;  Location: [facility] INVASIVE CV LAB CUPID;  Service: Cardiovascular;;  Prox  LAD  . TEE WITHOUT CARDIOVERSION N/A 04/24/2017   Procedure: TRANSESOPHAGEAL ECHOCARDIOGRAM (TEE);  Surgeon: Kerin Perna, MD;  Location: [facility] OR;  Service: Open Heart Surgery;  Laterality: N/A;  . WISDOM TOOTH EXTRACTION     bottom LT, top RT       Home Medications    Prior to Admission medications   Medication Sig Start Date End Date Taking? Authorizing Provider  amiodarone (PACERONE) 200 MG tablet Take 1 tablet (200 mg total) by mouth daily. 05/17/17   Tereso Newcomer T, PA-C  atorvastatin (LIPITOR) 80 MG tablet Take 1 tablet (80 mg total) by mouth daily. 05/17/17 08/19/17  Tereso Newcomer T, PA-C  carvedilol (COREG) 12.5 MG tablet TAKE 1 TABLET BY MOUTH TWICE DAILY WITH A MEAL 07/22/17   Janetta Hora, PA-C  lisinopril (PRINIVIL,ZESTRIL) 10 MG tablet TAKE 1 TABLET BY MOUTH DAILY, MUST KEEP APPOINTMENT FOR ADDITIONAL REFILLS 05/02/17   Pricilla Riffle, MD  metFORMIN (GLUCOPHAGE) 1000 MG tablet Take 1 tablet (1,000 mg total) by mouth 2 (two) times daily with a meal. 05/01/17   Gold, Glenice Laine, PA-C  spironolactone (ALDACTONE) 25 MG tablet Take 0.5 tablets (12.5 mg total) by mouth daily. 05/17/17 08/19/17  Tereso Newcomer T, PA-C  traMADol (ULTRAM) 50 MG tablet TAKE 1 TO 2 TABLETS BY MOUTH EVERY 6 HOURS AS NEEDED FOR MODERATE PAIN 05/13/17   Rowe Clack, PA-C    Family History  Family History  Problem Relation Age of Onset  . Breast cancer Mother   . Heart disease Father   . Diabetes Sister   . Hypertension Sister   . Hypertension Brother   . Diabetes Brother     Social History Social History  Substance Use Topics  . Smoking status: Current Some Day Smoker    Packs/day: 0.10    Types: Cigarettes    Last attempt to quit: 05/03/2015  . Smokeless tobacco: Never Used  . Alcohol use No     Comment: very occasionally     Allergies   Aspirin and Other   Review of Systems Review of Systems  Constitutional: Negative for chills, diaphoresis, fever and unexpected weight change.  HENT:  Negative for rhinorrhea and sore throat.   Respiratory: Positive for cough, shortness of breath and wheezing. Negative for hemoptysis.        +3 pillow orthopnea  Cardiovascular: Positive for orthopnea. Negative for chest pain, claudication and leg swelling.  Gastrointestinal: Positive for abdominal pain, diarrhea, nausea and vomiting. Negative for blood in stool and constipation.  Genitourinary: Negative for dysuria and hematuria.  Musculoskeletal: Negative for arthralgias, back pain and myalgias.  Skin: Negative for color change.  Allergic/Immunologic: Positive for immunocompromised state (DM2).  Neurological: Negative for weakness, light-headedness and numbness.  Psychiatric/Behavioral: Negative for confusion.   All other systems reviewed and are negative for acute change except as noted in the HPI.    Physical Exam Updated Vital Signs BP (!) 188/121 (BP Location: Right Arm)   Pulse 90   Temp 98.6 F (37 C) (Oral)   Resp 20   Ht 6' (1.829 m)   Wt 85.7 kg (189 lb)   SpO2 100%   BMI 25.63 kg/m   Physical Exam  Constitutional: He is oriented to person, place, and time. He appears well-developed and well-nourished.  Non-toxic appearance. He appears distressed (actively vomiting).  Afebrile, nontoxic, actively vomiting; BP elevated  HENT:  Head: Normocephalic and atraumatic.  Mouth/Throat: Oropharynx is clear and moist and mucous membranes are normal.  Eyes: Conjunctivae and EOM are normal. Right eye exhibits no discharge. Left eye exhibits no discharge.  Neck: Normal range of motion. Neck supple.  Cardiovascular: Normal rate, regular rhythm, normal heart sounds and intact distal pulses.  Exam reveals no gallop and no friction rub.   No murmur heard. RRR, nl s1/s2, no m/r/g, distal pulses intact, no pedal edema  Pulmonary/Chest: Effort normal. No respiratory distress. He has decreased breath sounds in the left lower field. He has no wheezes. He has no rhonchi. He has no rales.    Slightly diminished lung sounds in LLF, otherwise CTAB in all other lung fields, no w/r/r, no hypoxia or increased WOB, speaking in full sentences for the most part although occasionally speaking in fragmented sentences related to vomiting/wreching, SpO2 100% on RA  Abdominal: Soft. Normal appearance. He exhibits no distension. Bowel sounds are decreased. There is generalized tenderness. There is no rigidity, no rebound, no guarding, no CVA tenderness, no tenderness at McBurney's point and negative Murphy's sign.  Soft, nondistended, slightly hypoactive bowel sounds throughout, with very mild abd discomfort generalized throughout but no focal abdominal TTP, no r/g/r, neg murphy's, neg mcburney's, no CVA TTP   Musculoskeletal: Normal range of motion.  MAE x4 Strength and sensation grossly intact in all extremities Distal pulses intact No pedal edema, neg homan's bilaterally   Neurological: He is alert and oriented to person, place, and time. He has normal strength. No  sensory deficit.  Skin: Skin is warm, dry and intact. No rash noted.  Psychiatric: He has a normal mood and affect.  Nursing note and vitals reviewed.    ED Treatments / Results  Labs (all labs ordered are listed, but only abnormal results are displayed) Labs Reviewed  COMPREHENSIVE METABOLIC PANEL - Abnormal; Notable for the following:       Result Value   Glucose, Bld 221 (*)    Creatinine, Ser 1.40 (*)    Calcium 8.8 (*)    GFR calc non Af Amer 56 (*)    All other components within normal limits  CBC - Abnormal; Notable for the following:    RDW 16.6 (*)    All other components within normal limits  BRAIN NATRIURETIC PEPTIDE - Abnormal; Notable for the following:    B Natriuretic Peptide 1,385.3 (*)    All other components within normal limits  LIPASE, BLOOD  URINALYSIS, ROUTINE W REFLEX MICROSCOPIC  I-STAT TROPONIN, ED    EKG  EKG Interpretation  Date/Time:  Thursday September 12 2017 19:03:21  EDT Ventricular Rate:  74 PR Interval:  150 QRS Duration: 112 QT Interval:  396 QTC Calculation: 439 R Axis:   73 Text Interpretation:  Normal sinus rhythm Possible Left atrial enlargement ST-t wave abnormality Abnormal ekg Confirmed by Gerhard Munch [id number]) on 09/12/2017 7:11:46 PM       Radiology Dg Abd Acute W/chest  Result Date: 09/12/2017 CLINICAL DATA:  Shortness of breath and cough, abdominal pain, nausea, vomiting for few days. EXAM: DG ABDOMEN ACUTE W/ 1V CHEST COMPARISON:  Chest radiograph May 29, 2017 and CT abdomen and pelvis January 12, 2017 FINDINGS: Cardiac silhouette is mildly enlarged, mediastinal silhouette is nonsuspicious, status post CABG. Patchy LEFT lung base airspace opacities and bibasilar strandy densities. LEFT upper lobe pleural thickening is similar with scarring. No pneumothorax. Soft tissue planes and included osseous structures are nonsuspicious. Bowel gas pattern is nondilated and nonobstructive. No intra-abdominal mass effect. Punctate calcification projects in LEFT kidney. Phleboliths project in the pelvis. IMPRESSION: Mild cardiomegaly. Patchy LEFT lower lobe airspace opacity with underlying bullous changes concerning for pneumonia. Followup PA and lateral chest X-ray is recommended in 3-4 weeks following trial of antibiotic therapy to ensure resolution and exclude underlying malignancy. Mild cardiomegaly.  Mild chronic interstitial changes. Punctate LEFT nephrolithiasis. Normal bowel gas pattern. Electronically Signed   By: Awilda Metro M.D.   On: 09/12/2017 21:09    Procedures Procedures (including critical care time)  Medications Ordered in ED Medications  cefTRIAXone (ROCEPHIN) 1 g in dextrose 5 % 50 mL IVPB (1 g Intravenous New Bag/Given 09/12/17 2341)  azithromycin (ZITHROMAX) 500 mg in dextrose 5 % 250 mL IVPB (not administered)  HYDROmorphone (DILAUDID) injection 1 mg (not administered)  ondansetron (ZOFRAN-ODT) disintegrating tablet 4 mg (4  mg Oral Given 09/12/17 1914)  promethazine (PHENERGAN) injection 25 mg (25 mg Intravenous Given 09/12/17 2045)  morphine 4 MG/ML injection 4 mg (4 mg Intravenous Given 09/12/17 2045)  LORazepam (ATIVAN) injection 1 mg (1 mg Intravenous Given 09/12/17 2240)  albuterol (PROVENTIL) (2.5 MG/3ML) 0.083% nebulizer solution 5 mg (5 mg Nebulization Given 09/12/17 2341)  ipratropium (ATROVENT) nebulizer solution 0.5 mg (0.5 mg Nebulization Given 09/12/17 2341)  metoCLOPramide (REGLAN) injection 10 mg (10 mg Intravenous Given 09/12/17 2343)  diphenhydrAMINE (BENADRYL) injection 25 mg (25 mg Intravenous Given 09/12/17 2343)     Initial Impression / Assessment and Plan / ED Course  I have reviewed the triage vital signs and the  nursing notes.  Pertinent labs & imaging results that were available during my care of the patient were reviewed by me and considered in my medical decision making (see chart for details).     52 y.o. male here with SOB/cough/wheezing/orthopnea x1 day, stated it felt like he had excess fluid, and then today generalized abd pain/n/v/d. On exam, actively vomiting, with mild discomfort on abdominal palpation but no focal tenderness, nonperitoneal, but fairly hypoactive bowel sounds; lungs without wheezing/rhonchi/rales audible, perhaps slightly diminished lung sounds in LLL. No peripheral edema, no tachycardia or hypoxia, no increased WOB. EKG with some ST depression diffusely which is similar to prior EKG from cardiology office visit 08/19/17. Trop neg, lipase WNL, CMP with mildly bumped Cr 1.4 but somewhat similar to prior values in the past. CBC WNL. Awaiting U/A, will also add-on BNP. Will get acute abd series, give phenergan and morphine, then reassess shortly. Discussed case with my attending Dr. Rubin Payor who agrees with plan.   10:41 PM BNP 1385 which is up from his last BNP 960.3 on 04/22/17, but pt doesn't look fluid overloaded, doubt need for lasix at this time. Acute abd series  showing mild cardiomegaly and chronic interstitial changes, patchy LLL PNA, normal gas pattern, punctate L nephrolithiasis, otherwise no other acute findings. Pt still in pain and vomiting, ativan given to see if this helps. Awaiting U/A. Will reassess shortly  10:59 PM After ativan, pt placed on O2 just due to being somnolent, however seems to be satting well on RA when awake. Pt vomiting again now. Will give reglan/benadryl and if this doesn't work then would need to admit for intractable n/v. Will give duoneb to see if this will help with his SOB, although he's not really wheezing. Will start abx for CAP. Will reassess shortly. Of note, BP starting to trend down into the 160s/90s after meds. Will continue to monitor but doubt need for emergent antihypertensives at this time.   12:01 AM Pt still vomiting despite zofran, phenergan, ativan, and reglan. Still c/o pain, will give dilaudid now. U/A not yet obtained, will ensure this is done now. Given intractable n/v, will proceed with admission for tx of this in addition to tx of his CAP. Of note, lung sounds fairly similar after duoneb, but doubt need for repeat of this right now.   12:26 AM Dr. Maryfrances Bunnell of [facility] returning page and will admit. Holding orders to be placed by admitting team. Please see their notes for further documentation of care. I appreciate their help with this pleasant pt's care. Pt stable at time of admission.    Final Clinical Impressions(s) / ED Diagnoses   Final diagnoses:  Generalized abdominal pain  Nausea vomiting and diarrhea  SOB (shortness of breath)  Community acquired pneumonia of left lower lobe of lung (HCC)  Essential hypertension  Elevated brain natriuretic peptide (BNP) level  Intractable vomiting with nausea, unspecified vomiting type    New Prescriptions New Prescriptions   No medications on [address] [place], [place] 09/13/17 1610    Benjiman Core, MD 09/15/17 1534

## 2017-09-12 NOTE — ED Notes (Signed)
Pt's oxygen dropped into the 80s.  Placed pt on 3L O2 via nasal cannula.

## 2017-09-12 NOTE — ED Triage Notes (Signed)
Pt endorses shob and cough since yesterday and n/v/d with abd pain that began today. Pt able to speak in short sentences. Had open heart surgery in may. Denies chest pain. Hypertensive in triage.

## 2017-09-12 NOTE — ED Notes (Signed)
Patient transported to X-ray 

## 2017-09-13 ENCOUNTER — Inpatient Hospital Stay (HOSPITAL_COMMUNITY): Payer: Medicaid Other

## 2017-09-13 ENCOUNTER — Encounter (HOSPITAL_COMMUNITY): Payer: Self-pay | Admitting: Family Medicine

## 2017-09-13 ENCOUNTER — Ambulatory Visit (HOSPITAL_BASED_OUTPATIENT_CLINIC_OR_DEPARTMENT_OTHER): Payer: Medicaid Other

## 2017-09-13 DIAGNOSIS — Z951 Presence of aortocoronary bypass graft: Secondary | ICD-10-CM | POA: Diagnosis not present

## 2017-09-13 DIAGNOSIS — R112 Nausea with vomiting, unspecified: Secondary | ICD-10-CM | POA: Diagnosis not present

## 2017-09-13 DIAGNOSIS — I361 Nonrheumatic tricuspid (valve) insufficiency: Secondary | ICD-10-CM | POA: Diagnosis not present

## 2017-09-13 DIAGNOSIS — I2510 Atherosclerotic heart disease of native coronary artery without angina pectoris: Secondary | ICD-10-CM

## 2017-09-13 DIAGNOSIS — I472 Ventricular tachycardia: Secondary | ICD-10-CM

## 2017-09-13 DIAGNOSIS — E785 Hyperlipidemia, unspecified: Secondary | ICD-10-CM | POA: Insufficient documentation

## 2017-09-13 DIAGNOSIS — K219 Gastro-esophageal reflux disease without esophagitis: Secondary | ICD-10-CM | POA: Diagnosis present

## 2017-09-13 DIAGNOSIS — I071 Rheumatic tricuspid insufficiency: Secondary | ICD-10-CM | POA: Insufficient documentation

## 2017-09-13 DIAGNOSIS — Z955 Presence of coronary angioplasty implant and graft: Secondary | ICD-10-CM | POA: Diagnosis not present

## 2017-09-13 DIAGNOSIS — E119 Type 2 diabetes mellitus without complications: Secondary | ICD-10-CM

## 2017-09-13 DIAGNOSIS — E1122 Type 2 diabetes mellitus with diabetic chronic kidney disease: Secondary | ICD-10-CM | POA: Diagnosis present

## 2017-09-13 DIAGNOSIS — I5043 Acute on chronic combined systolic (congestive) and diastolic (congestive) heart failure: Secondary | ICD-10-CM | POA: Diagnosis present

## 2017-09-13 DIAGNOSIS — I255 Ischemic cardiomyopathy: Secondary | ICD-10-CM | POA: Diagnosis present

## 2017-09-13 DIAGNOSIS — I10 Essential (primary) hypertension: Secondary | ICD-10-CM

## 2017-09-13 DIAGNOSIS — Z720 Tobacco use: Secondary | ICD-10-CM | POA: Insufficient documentation

## 2017-09-13 DIAGNOSIS — Z7984 Long term (current) use of oral hypoglycemic drugs: Secondary | ICD-10-CM | POA: Diagnosis not present

## 2017-09-13 DIAGNOSIS — Z886 Allergy status to analgesic agent status: Secondary | ICD-10-CM | POA: Diagnosis not present

## 2017-09-13 DIAGNOSIS — R0600 Dyspnea, unspecified: Secondary | ICD-10-CM

## 2017-09-13 DIAGNOSIS — N183 Chronic kidney disease, stage 3 (moderate): Secondary | ICD-10-CM

## 2017-09-13 DIAGNOSIS — Z9119 Patient's noncompliance with other medical treatment and regimen: Secondary | ICD-10-CM | POA: Diagnosis not present

## 2017-09-13 DIAGNOSIS — E1159 Type 2 diabetes mellitus with other circulatory complications: Secondary | ICD-10-CM

## 2017-09-13 DIAGNOSIS — J159 Unspecified bacterial pneumonia: Secondary | ICD-10-CM | POA: Diagnosis present

## 2017-09-13 DIAGNOSIS — Z91018 Allergy to other foods: Secondary | ICD-10-CM | POA: Diagnosis not present

## 2017-09-13 DIAGNOSIS — Z833 Family history of diabetes mellitus: Secondary | ICD-10-CM | POA: Diagnosis not present

## 2017-09-13 DIAGNOSIS — J189 Pneumonia, unspecified organism: Secondary | ICD-10-CM | POA: Diagnosis not present

## 2017-09-13 DIAGNOSIS — Z8249 Family history of ischemic heart disease and other diseases of the circulatory system: Secondary | ICD-10-CM | POA: Diagnosis not present

## 2017-09-13 DIAGNOSIS — I130 Hypertensive heart and chronic kidney disease with heart failure and stage 1 through stage 4 chronic kidney disease, or unspecified chronic kidney disease: Secondary | ICD-10-CM | POA: Diagnosis present

## 2017-09-13 DIAGNOSIS — Z803 Family history of malignant neoplasm of breast: Secondary | ICD-10-CM | POA: Diagnosis not present

## 2017-09-13 DIAGNOSIS — I252 Old myocardial infarction: Secondary | ICD-10-CM | POA: Diagnosis not present

## 2017-09-13 DIAGNOSIS — Z79891 Long term (current) use of opiate analgesic: Secondary | ICD-10-CM | POA: Diagnosis not present

## 2017-09-13 DIAGNOSIS — N200 Calculus of kidney: Secondary | ICD-10-CM | POA: Diagnosis present

## 2017-09-13 DIAGNOSIS — F17210 Nicotine dependence, cigarettes, uncomplicated: Secondary | ICD-10-CM | POA: Diagnosis present

## 2017-09-13 LAB — URINALYSIS, ROUTINE W REFLEX MICROSCOPIC
BACTERIA UA: NONE SEEN
Bilirubin Urine: NEGATIVE
GLUCOSE, UA: 50 mg/dL — AB
HGB URINE DIPSTICK: NEGATIVE
Ketones, ur: 5 mg/dL — AB
LEUKOCYTES UA: NEGATIVE
Nitrite: NEGATIVE
PH: 5 (ref 5.0–8.0)
PROTEIN: 100 mg/dL — AB
Specific Gravity, Urine: 1.012 (ref 1.005–1.030)
Squamous Epithelial / HPF: NONE SEEN

## 2017-09-13 LAB — CBC
HCT: 40.6 % (ref 39.0–52.0)
HEMOGLOBIN: 14 g/dL (ref 13.0–17.0)
MCH: 31.7 pg (ref 26.0–34.0)
MCHC: 34.5 g/dL (ref 30.0–36.0)
MCV: 92.1 fL (ref 78.0–100.0)
Platelets: 221 10*3/uL (ref 150–400)
RBC: 4.41 MIL/uL (ref 4.22–5.81)
RDW: 16.5 % — ABNORMAL HIGH (ref 11.5–15.5)
WBC: 8 10*3/uL (ref 4.0–10.5)

## 2017-09-13 LAB — ECHOCARDIOGRAM COMPLETE
Height: 72 in
WEIGHTICAEL: 2993.6 [oz_av]

## 2017-09-13 LAB — PROCALCITONIN

## 2017-09-13 LAB — CBG MONITORING, ED: Glucose-Capillary: 225 mg/dL — ABNORMAL HIGH (ref 65–99)

## 2017-09-13 LAB — GLUCOSE, CAPILLARY
GLUCOSE-CAPILLARY: 99 mg/dL (ref 65–99)
GLUCOSE-CAPILLARY: 150 mg/dL — AB (ref 65–99)
GLUCOSE-CAPILLARY: 221 mg/dL — AB (ref 65–99)

## 2017-09-13 LAB — TROPONIN I: TROPONIN I: 0.03 ng/mL — AB (ref <0.03)

## 2017-09-13 IMAGING — CR DG CHEST 1V PORT
1 series · 1 of 1 positions shown · non-contrast
Comparison: 04/24/2017

CLINICAL DATA: Status post coronary bypass grafting

EXAM:
PORTABLE CHEST 1 VIEW

[AP]
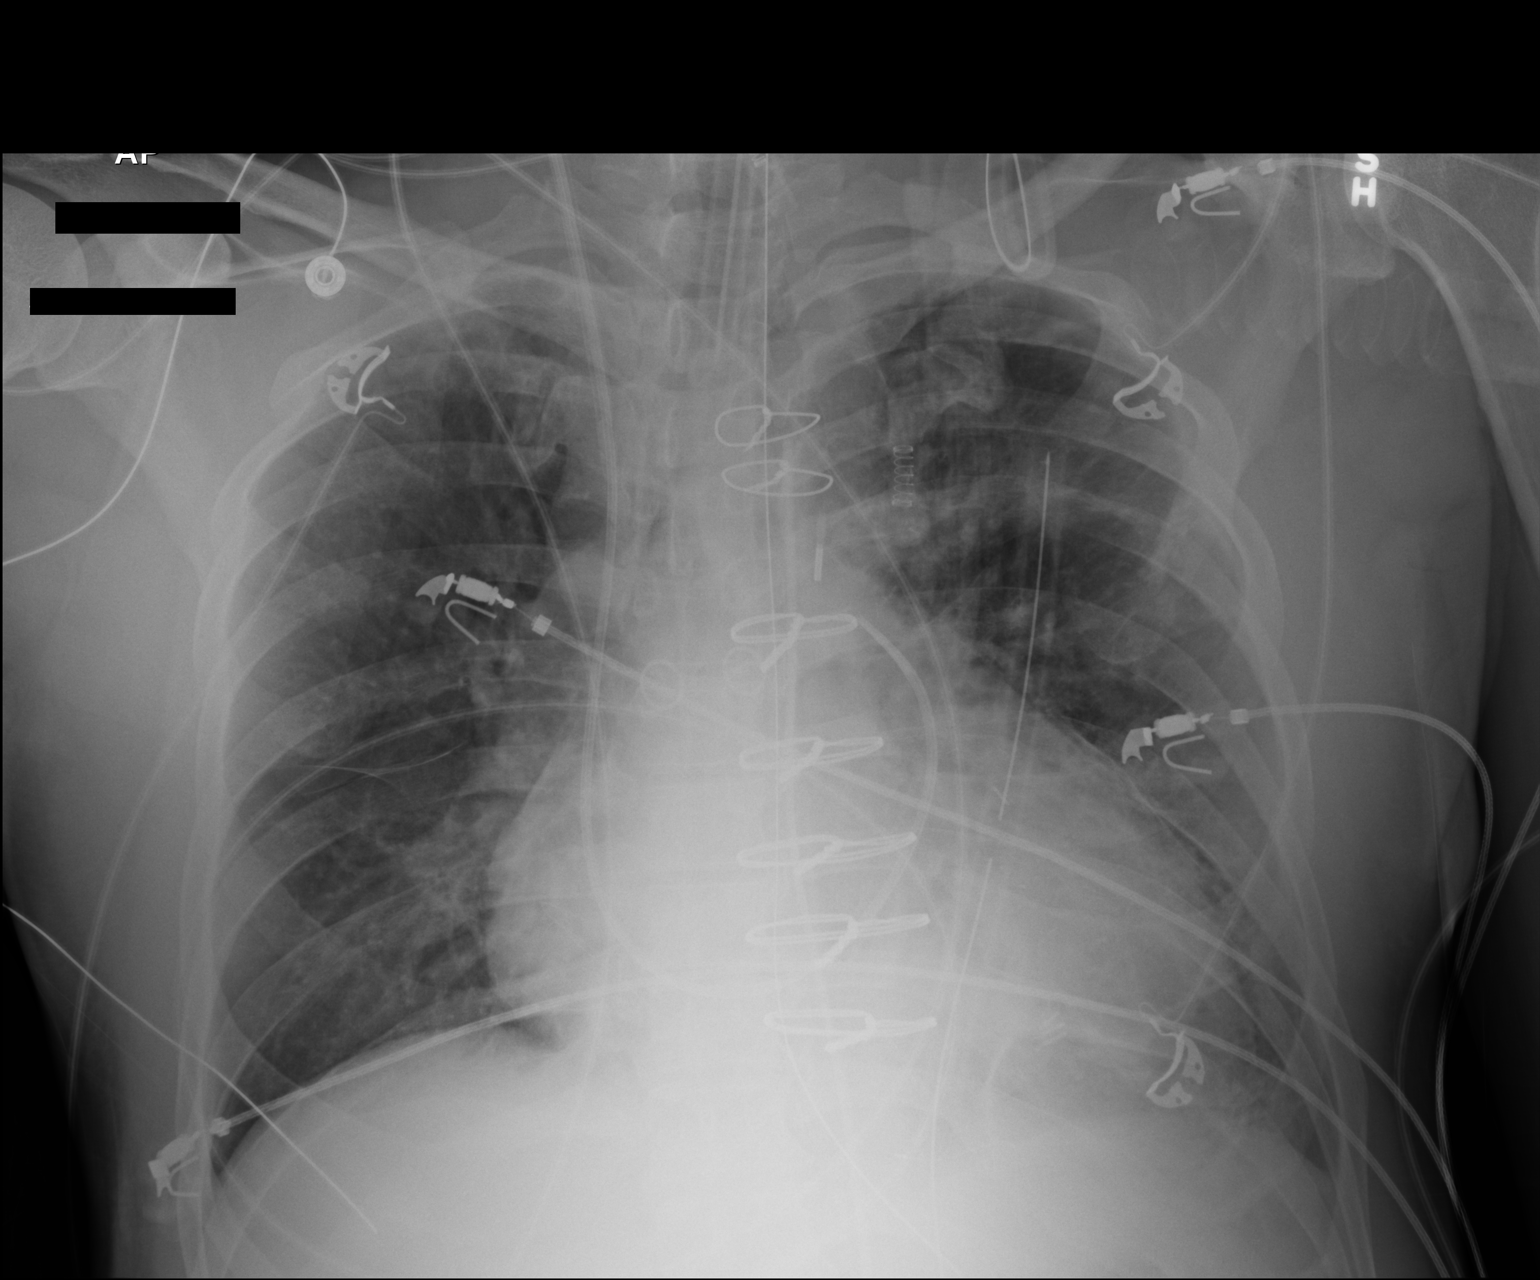

[1 of 1 positions shown; findings below may reference images not displayed]

FINDINGS: Endotracheal tube, nasogastric catheter, left thoracostomy catheter
and mediastinal drain are noted in satisfactory position. Swan-Ganz
catheter is noted in the pulmonary outflow tract. An intra-aortic
balloon pump is seen and stable. The lungs are well aerated
bilaterally. No pneumothorax is seen. Improvement in the degree of
vascular congestion is noted.
IMPRESSION: Postoperative change with tubes and lines as described.

Improved vascular congestion when compared with the prior exam.

## 2017-09-13 MED ORDER — PROMETHAZINE HCL 25 MG RE SUPP
25.0000 mg | Freq: Four times a day (QID) | RECTAL | Status: DC | PRN
  Filled 2017-09-13: qty 1

## 2017-09-13 MED ORDER — HYDROMORPHONE HCL 1 MG/ML IJ SOLN
1.0000 mg | Freq: Once | INTRAMUSCULAR | Status: AC
  Administered 2017-09-13: 1 mg via INTRAVENOUS
  Filled 2017-09-13: qty 1

## 2017-09-13 MED ORDER — INSULIN ASPART 100 UNIT/ML ~~LOC~~ SOLN
0.0000 [IU] | Freq: Every day | SUBCUTANEOUS | Status: DC
  Administered 2017-09-13: 2 [IU] via SUBCUTANEOUS

## 2017-09-13 MED ORDER — INSULIN ASPART 100 UNIT/ML ~~LOC~~ SOLN
0.0000 [IU] | Freq: Three times a day (TID) | SUBCUTANEOUS | Status: DC
  Administered 2017-09-13: 2 [IU] via SUBCUTANEOUS
  Administered 2017-09-13: 5 [IU] via SUBCUTANEOUS
  Administered 2017-09-14: 2 [IU] via SUBCUTANEOUS
  Administered 2017-09-14: 5 [IU] via SUBCUTANEOUS
  Filled 2017-09-13: qty 1

## 2017-09-13 MED ORDER — SPIRONOLACTONE 25 MG PO TABS
12.5000 mg | ORAL_TABLET | Freq: Every day | ORAL | Status: DC
  Administered 2017-09-13 – 2017-09-14 (×2): 12.5 mg via ORAL
  Filled 2017-09-13: qty 1
  Filled 2017-09-13: qty 0.5
  Filled 2017-09-13: qty 1

## 2017-09-13 MED ORDER — POTASSIUM CHLORIDE CRYS ER 20 MEQ PO TBCR
20.0000 meq | EXTENDED_RELEASE_TABLET | Freq: Two times a day (BID) | ORAL | Status: DC
  Administered 2017-09-13 – 2017-09-14 (×2): 20 meq via ORAL
  Filled 2017-09-13 (×2): qty 1

## 2017-09-13 MED ORDER — ATORVASTATIN CALCIUM 80 MG PO TABS
80.0000 mg | ORAL_TABLET | Freq: Every day | ORAL | Status: DC
  Administered 2017-09-13 – 2017-09-14 (×2): 80 mg via ORAL
  Filled 2017-09-13 (×3): qty 1

## 2017-09-13 MED ORDER — ONDANSETRON HCL 4 MG PO TABS: 4.0000 mg | ORAL_TABLET | Freq: Four times a day (QID) | ORAL | Status: DC | PRN

## 2017-09-13 MED ORDER — CARVEDILOL 12.5 MG PO TABS
12.5000 mg | ORAL_TABLET | Freq: Two times a day (BID) | ORAL | Status: DC
Start: 2017-09-13 — End: 2017-09-13

## 2017-09-13 MED ORDER — ACETAMINOPHEN 325 MG PO TABS: 650.0000 mg | ORAL_TABLET | Freq: Four times a day (QID) | ORAL | Status: DC | PRN

## 2017-09-13 MED ORDER — POTASSIUM CHLORIDE CRYS ER 20 MEQ PO TBCR
20.0000 meq | EXTENDED_RELEASE_TABLET | Freq: Two times a day (BID) | ORAL | Status: DC
  Administered 2017-09-13: 20 meq via ORAL
  Filled 2017-09-13: qty 1

## 2017-09-13 MED ORDER — PROMETHAZINE HCL 25 MG/ML IJ SOLN: 12.5000 mg | Freq: Four times a day (QID) | INTRAMUSCULAR | Status: DC | PRN

## 2017-09-13 MED ORDER — ONDANSETRON HCL 4 MG/2ML IJ SOLN: 4.0000 mg | Freq: Four times a day (QID) | INTRAMUSCULAR | Status: DC | PRN

## 2017-09-13 MED ORDER — LEVOFLOXACIN IN D5W 750 MG/150ML IV SOLN
750.0000 mg | INTRAVENOUS | Status: DC
  Administered 2017-09-13: 750 mg via INTRAVENOUS
  Filled 2017-09-13: qty 150

## 2017-09-13 MED ORDER — AMIODARONE HCL 200 MG PO TABS
200.0000 mg | ORAL_TABLET | Freq: Every day | ORAL | Status: DC
  Administered 2017-09-13 – 2017-09-14 (×2): 200 mg via ORAL
  Filled 2017-09-13 (×2): qty 1

## 2017-09-13 MED ORDER — LISINOPRIL 10 MG PO TABS: 10.0000 mg | ORAL_TABLET | Freq: Every day | ORAL | Status: DC

## 2017-09-13 MED ORDER — PROMETHAZINE HCL 25 MG PO TABS: 25.0000 mg | ORAL_TABLET | Freq: Four times a day (QID) | ORAL | Status: DC | PRN

## 2017-09-13 MED ORDER — CARVEDILOL 12.5 MG PO TABS
12.5000 mg | ORAL_TABLET | Freq: Two times a day (BID) | ORAL | Status: DC
  Administered 2017-09-13 – 2017-09-14 (×3): 12.5 mg via ORAL
  Filled 2017-09-13 (×3): qty 1

## 2017-09-13 MED ORDER — TRAMADOL HCL 50 MG PO TABS: 50.0000 mg | ORAL_TABLET | Freq: Four times a day (QID) | ORAL | Status: DC | PRN

## 2017-09-13 MED ORDER — FUROSEMIDE 10 MG/ML IJ SOLN
40.0000 mg | Freq: Two times a day (BID) | INTRAMUSCULAR | Status: DC
  Administered 2017-09-13 – 2017-09-14 (×4): 40 mg via INTRAVENOUS
  Filled 2017-09-13 (×3): qty 4

## 2017-09-13 MED ORDER — LISINOPRIL 10 MG PO TABS
10.0000 mg | ORAL_TABLET | Freq: Every day | ORAL | Status: DC
  Administered 2017-09-13: 10 mg via ORAL
  Filled 2017-09-13: qty 1

## 2017-09-13 MED ORDER — ACETAMINOPHEN 650 MG RE SUPP: 650.0000 mg | Freq: Four times a day (QID) | RECTAL | Status: DC | PRN

## 2017-09-13 MED ORDER — ENOXAPARIN SODIUM 40 MG/0.4ML ~~LOC~~ SOLN
40.0000 mg | Freq: Every day | SUBCUTANEOUS | Status: DC
  Filled 2017-09-13 (×3): qty 0.4

## 2017-09-13 NOTE — ED Notes (Signed)
Admitting MD is here to see pt 

## 2017-09-13 NOTE — H&P (Signed)
History and Physical  Patient Name: Alan Diaz     RUE:[id number]    DOB: 1965-03-01    DOA: 09/12/2017 PCP: Patient, No Pcp Per  Patient coming from: Home  Chief Complaint: Dyspnea, nausea, vomiting, orthopnea      HPI: Aryn Kops is a 52 y.o. male with a past medical history significant for CAD s/p CABG in Apr, isch CM with CHF EF 25% in July, HTN and NIDDM who presents with cough, fatigue.  History is mostly collected from the patient's wife, supplemented by the patient. They report that over the last several days he has become more and more tired with exertion. While he is normally not limited physical activity since his CABG and can walk long distances, and the last few days he has had shortness of breath with any exertion, increased orthopnea, waking at night with dyspnea, and the feeling that "more fluid was in my lungs". Then today he had uncontrollable nausea and vomiting and so he came to the emergency room.  ED course: -Afebrile, heart rate 90, respirations 22, pulse ox 94% on room air, blood pressure 188/121 -Na 137, K 4.0, Cr 1.4 (baseline 1.25), WBC 5.3K, Hgb 13.4 -Lipase normal -BNP 1385 -Troponin negative -Chest x-ray with bilateral streaky opacities, possible asymmetric to the left -Abdominal x-ray nonobstructive gas pattern -EG showed stable strain pattern -He was given ceftriaxone and azithromycin for pneumonia, Reglan and Phenergan as well as morphine, hydromorphone, lorazepam, and diphenhydramine for nausea and TRH was asked to evaluate    He is not on furosemide at baseline, does not have when necessary supply. Sounds as if he is normally pretty euvolemic, but his wife says lately he has been going "back into bad habits, lots of salt, processed meats". There has been no recent fever, sputum production, chills.          ROS: Review of Systems  Constitutional: Positive for malaise/fatigue. Negative for fever.  Respiratory: Positive for cough and  shortness of breath. Negative for hemoptysis and sputum production.   Cardiovascular: Positive for orthopnea and PND. Negative for chest pain, palpitations and leg swelling.  Gastrointestinal: Positive for nausea and vomiting.  All other systems reviewed and are negative.         Past Medical History:  Diagnosis Date  . CAD (coronary artery disease) 2016   a. NSTEMI 5/16 s/p DES to pLAD and dRCA // b. NSTEMI 4/18 >> 95% LM, oLAD stent ok, oLCx 80, mRCA stent ok, EF < 25 >> IABP // c. s/p CABG with Dr. Zenaida Niece Trig (L-LAD, S-OM, S-PDA)  . Diabetes mellitus   . GERD (gastroesophageal reflux disease)   . HLD (hyperlipidemia)   . Hypertension   . Ischemic cardiomyopathy    a. 2D ECHO 05/02/15 with EF 25-30%, mild LV dilation. Mild LVH, mod HKof the mid-apicalanteroseptal myocardium, G1DD // b. Mild LVH, EF 25, diffuse HK, grade 1 diastolic dysfunction, trivial MR // Echo 7/18: Mild concentric LVH, EF 25-30, inferior and inferolateral AK, grade 2 diastolic dysfunction, trivial MR/PI   . Tobacco abuse     Past Surgical History:  Procedure Laterality Date  . CARDIAC CATHETERIZATION N/A 05/03/2015   Procedure: Right/Left Heart Cath and Coronary Angiography;  Surgeon: Peter M Swaziland, MD;  Location: [facility] INVASIVE CV LAB CUPID;  Service: Cardiovascular;  Laterality: N/A;  . CARDIAC CATHETERIZATION N/A 05/03/2015   Procedure: Intravascular Ultrasound/IVUS;  Surgeon: Peter M Swaziland, MD;  Location: [facility] INVASIVE CV LAB [facility];  Service: Cardiovascular;  Laterality: N/A;  .  CARDIAC CATHETERIZATION N/A 05/03/2015   Procedure: Coronary Stent Intervention;  Surgeon: Peter M Swaziland, MD;  Location: [facility] INVASIVE CV LAB CUPID;  Service: Cardiovascular;  Laterality: N/A;  rca  . CORONARY ARTERY BYPASS GRAFT N/A 04/24/2017   Procedure: CORONARY ARTERY BYPASS GRAFTING (CABG) times three using left intenal mammary artery and right saphenous vein;  Surgeon: Kerin Perna, MD;  Location: [facility] OR;  Service: Open Heart Surgery;   Laterality: N/A;  . IABP INSERTION N/A 04/24/2017   Procedure: IABP Insertion;  Surgeon: Marykay Lex, MD;  Location: [facility] INVASIVE CV LAB;  Service: Cardiovascular;  Laterality: N/A;  . LEFT HEART CATH AND CORONARY ANGIOGRAPHY N/A 04/24/2017   Procedure: Left Heart Cath and Coronary Angiography;  Surgeon: Marykay Lex, MD;  Location: [facility] INVASIVE CV LAB;  Service: Cardiovascular;  Laterality: N/A;  . PERCUTANEOUS CORONARY STENT INTERVENTION (PCI-S)  05/03/2015   Procedure: Percutaneous Coronary Stent Intervention (Pci-S);  Surgeon: Peter M Swaziland, MD;  Location: [facility] INVASIVE CV LAB CUPID;  Service: Cardiovascular;;  Prox LAD  . TEE WITHOUT CARDIOVERSION N/A 04/24/2017   Procedure: TRANSESOPHAGEAL ECHOCARDIOGRAM (TEE);  Surgeon: Kerin Perna, MD;  Location: [facility] OR;  Service: Open Heart Surgery;  Laterality: N/A;  . WISDOM TOOTH EXTRACTION     bottom LT, top RT    Social History: Patient lives with his wife.  The patient walks unassisted, usually can walk for >10 minutes at a time without difficulty.  Active smoker.  Allergies  Allergen Reactions  . Aspirin Hives and Other (See Comments)    Makes pt feel funny  . Other Nausea And Vomiting and Other (See Comments)    Grape products    Family history: family history includes Breast cancer in his mother; Diabetes in his brother and sister; Heart disease in his father; Hypertension in his brother and sister.  Prior to Admission medications   Medication Sig Start Date End Date Taking? Authorizing Provider  amiodarone (PACERONE) 200 MG tablet Take 1 tablet (200 mg total) by mouth daily. 05/17/17  Yes Weaver, Scott T, PA-C  atorvastatin (LIPITOR) 80 MG tablet Take 1 tablet (80 mg total) by mouth daily. 05/17/17 09/12/17 Yes Weaver, Scott T, PA-C  carvedilol (COREG) 12.5 MG tablet TAKE 1 TABLET BY MOUTH TWICE DAILY WITH A MEAL 07/22/17  Yes Janetta Hora, PA-C  lisinopril (PRINIVIL,ZESTRIL) 10 MG tablet TAKE 1 TABLET BY MOUTH DAILY, MUST KEEP  APPOINTMENT FOR ADDITIONAL REFILLS Patient taking differently: TAKE 10 mg TABLET BY MOUTH DAILY, MUST KEEP APPOINTMENT FOR ADDITIONAL REFILLS 05/02/17  Yes Pricilla Riffle, MD  metFORMIN (GLUCOPHAGE) 1000 MG tablet Take 1 tablet (1,000 mg total) by mouth 2 (two) times daily with a meal. 05/01/17  Yes Gold, Glenice Laine, PA-C  spironolactone (ALDACTONE) 25 MG tablet Take 0.5 tablets (12.5 mg total) by mouth daily. 05/17/17 09/12/17 Yes Weaver, Scott T, PA-C  traMADol (ULTRAM) 50 MG tablet TAKE 1 TO 2 TABLETS BY MOUTH EVERY 6 HOURS AS NEEDED FOR MODERATE PAIN 05/13/17  Yes Rowe Clack, PA-C       Physical Exam: BP (!) 176/106   Pulse 72   Temp 98.6 F (37 C) (Oral)   Resp 20   Ht 6' (1.829 m)   Wt 85.7 kg (189 lb)   SpO2 95%   BMI 25.63 kg/m  General appearance: Thin adult male, sedated from recent hydromorphone, lorazepam, morphine and diphenhydramine, but in no obviuos distress.   Eyes: Anicteric, conjunctiva pink, lids and lashes normal. PERRL.  ENT: No nasal deformity, discharge, epistaxis.  Hearing normal. OP moist without lesions.   Neck: No neck masses.  Trachea midline.  No thyromegaly/tenderness. Lymph: No cervical or supraclavicular lymphadenopathy. Skin: Warm and dry.  No jaundice.  No suspicious rashes or lesions. Cardiac: RRR, nl S1-S2, no murmurs appreciated.  Capillary refill is brisk.  JVP visible.  No LE edema.  Radial pulses 2+ and symmetric. Respiratory: Tachpyneic, poor inspiratory effort, but no obvious rales, wheezes. Abdomen: Abdomen soft.  No TTP. No ascites, distension, hepatosplenomegaly.   MSK: No deformities or effusions.  No cyanosis or clubbing. Neuro: Cranial nerves grossly symmetric.  Sensation intact to light touch. Speech is fluent.  Muscle strength symmetric.    Psych: Sensorium blunted by sedatives, but oriented.   Judgment and insight appear normal.     Labs on Admission:  I have personally reviewed following labs and imaging studies: CBC:  Recent  Labs Lab 09/12/17 1911  WBC 5.3  HGB 13.4  HCT 39.5  MCV 92.3  PLT 229   Basic Metabolic Panel:  Recent Labs Lab 09/12/17 1911  NA 137  K 4.0  CL 107  CO2 23  GLUCOSE 221*  BUN 13  CREATININE 1.40*  CALCIUM 8.8*   GFR: Estimated Creatinine Clearance: 67 mL/min (A) (by C-G formula based on SCr of 1.4 mg/dL (H)).  Liver Function Tests:  Recent Labs Lab 09/12/17 1911  AST 24  ALT 20  ALKPHOS 93  BILITOT 0.6  PROT 7.3  ALBUMIN 3.7    Recent Labs Lab 09/12/17 1911  LIPASE 24   No results for input(s): AMMONIA in the last 168 hours. Coagulation Profile: No results for input(s): INR, PROTIME in the last 168 hours. Cardiac Enzymes: No results for input(s): CKTOTAL, CKMB, CKMBINDEX, TROPONINI in the last 168 hours. BNP (last 3 results) No results for input(s): PROBNP in the last 8760 hours. HbA1C: No results for input(s): HGBA1C in the last 72 hours. CBG: No results for input(s): GLUCAP in the last 168 hours. Lipid Profile: No results for input(s): CHOL, HDL, LDLCALC, TRIG, CHOLHDL, LDLDIRECT in the last 72 hours. Thyroid Function Tests: No results for input(s): TSH, T4TOTAL, FREET4, T3FREE, THYROIDAB in the last 72 hours. Anemia Panel: No results for input(s): VITAMINB12, FOLATE, FERRITIN, TIBC, IRON, RETICCTPCT in the last 72 hours. Sepsis Labs:  Invalid input(s): PROCALCITONIN, LACTICIDVEN No results found for this or any previous visit (from the past 240 hour(s)).       Radiological Exams on Admission: Personally reviewed CXR shows mostly symmetric opacities, AXR report reviewed: Dg Abd Acute W/chest  Result Date: 09/12/2017 CLINICAL DATA:  Shortness of breath and cough, abdominal pain, nausea, vomiting for few days. EXAM: DG ABDOMEN ACUTE W/ 1V CHEST COMPARISON:  Chest radiograph May 29, 2017 and CT abdomen and pelvis January 12, 2017 FINDINGS: Cardiac silhouette is mildly enlarged, mediastinal silhouette is nonsuspicious, status post CABG. Patchy  LEFT lung base airspace opacities and bibasilar strandy densities. LEFT upper lobe pleural thickening is similar with scarring. No pneumothorax. Soft tissue planes and included osseous structures are nonsuspicious. Bowel gas pattern is nondilated and nonobstructive. No intra-abdominal mass effect. Punctate calcification projects in LEFT kidney. Phleboliths project in the pelvis. IMPRESSION: Mild cardiomegaly. Patchy LEFT lower lobe airspace opacity with underlying bullous changes concerning for pneumonia. Followup PA and lateral chest X-ray is recommended in 3-4 weeks following trial of antibiotic therapy to ensure resolution and exclude underlying malignancy. Mild cardiomegaly.  Mild chronic interstitial changes. Punctate LEFT nephrolithiasis. Normal bowel gas  pattern. Electronically Signed   By: Awilda Metro M.D.   On: 09/12/2017 21:09    EKG: Independently reviewed. Rate 74, QTc 439, strain pattern, no change from previous.  Echocardiogram July 2018: Report reviewed EF 25-30% Grade 2 DD        Assessment/Plan  1. Acute on chronic systolic and diastolic CHF:  Elevated BNP, dyspnea on exertion, orthopnea and PND. EF 25%. Precipitant salt intake. Usually does not require a daily diuretic.   -Furosemide 40 mg IV twice a day  -K supplement -Strict I/Os, daily weights, telemetry  -Daily monitoring renal function -Continue carvedilol, lisinopril, spironolactone   2. Abnormal chest x-ray:  Favor that this chest x-ray represents edema. -Check Procalcitonin -Monitor off antibiotics  3. NSVT:  Doubt amiodarone toxicity given orthopnea, PND, elevated BNP. -Continue amiodarone  4. Hypertension and coronary disease secondary prevention:  -Continue carvedilol, lisinopril, statin  5. Nausea and vomiting:  Abdominal exam benign, no leukocytosis. Suspect this is bowel wall swelling from edema. -Ondansetron or Phenergan when necessary for nausea -Low threshold to CT if persistent after  diuresis  6. Diabetes:  -Hold metformin -SSI with meals  7. Chronic kidney disease:  Based on creatinine 1.2, near baseline.   8. Other medications: -Continue tramadol when necessary      DVT prophylaxis: Lovenox  Code Status: Full code  Family Communication: Wife at bedside  Disposition Plan: Anticipate IV diuresis. Likely for several days. Consults called: None overnight Admission status: INPatient         Medical decision making: Patient seen at 12:35 AM on 09/13/2017.  The patient was discussed with [address]  What exists of the patient's chart was reviewed in depth and summarized above.  Clinical condition: Stable.        Alberteen Sam [facility] Pager [phone]       At the time of admission, it appears that the appropriate admission status for this patient is INPATIENT. This is judged to be reasonable and necessary in order to provide the required intensity of service to ensure the patient's safety given the presenting symptoms, physical exam findings, and initial radiographic and laboratory data in the context of their chronic comorbidities.  Together, these circumstances are felt to place him at high risk for further clinical deterioration threatening life, limb, or organ.   Patient requires inpatient status due to high intensity of service, high risk for further deterioration and high frequency of surveillance required because of this severe exacerbation of their chronic organ failure.  Factors support inpatient status included presentation with clinical CHF in the setting of advanced ischemic cardiomyopathy, chronic systolic CHF with EF 25%, hypertension and diabetes, with elevated blood pressure.  I certify that at the point of admission it is my clinical judgment that the patient will require inpatient hospital care spanning beyond 2 midnights from the point of admission and that early discharge would result in unnecessary risk of  decompensation and readmission or threat to life, limb or bodily function.

## 2017-09-13 NOTE — Progress Notes (Signed)
  Echocardiogram 2D Echocardiogram has been performed.  Alan Diaz 09/13/2017, 3:17 PM

## 2017-09-13 NOTE — Consult Note (Signed)
Cardiology Consultation:   Patient ID: Alan Diaz; [id number]; 10/01/1965   Admit date: 09/12/2017 Date of Consult: 09/13/2017  Primary Care Provider: Patient, No Pcp Per Primary Cardiologist: Dr. Tenny Craw Primary Electrophysiologist:  Dr. Johney Frame  Patient Profile:   Alan Diaz is a 52 y.o. male with a hx of CAD (s/p DES to pLAD and dRCA in 05/2015; CABG 04/2017 ), ischemic cardiomyopathy, chronic combined systolic and diastolic CHF, NSVT, Diabetes, HTN, HLD, and tobacco use who is being seen today for the evaluation of CHF at the request of Dr. Hanley Ben.   He was admitted 04/22/17-05/01/17 with a NSTEMI complicated by a/c combined systolic and diastolic CHF.  Echo demonstrated worsening LV function with an EF 25% and mild diastolic dysfunction. LHC demonstrated severe LM stenosis and IABP was placed.  He underwent emergent CABG with Dr. Zenaida Niece Trigt (LIMA-LAD, SVG-OM, SVG-PDA). Postoperative course was notable for volume excess requiring Lasix drip.   Echo 06/2017 showed persisted Low LVEF of 25-30%. The patient was referred to Dr. Johney Frame who recommended ICD however patient declined. Dr. Johney Frame recommended discontinuation of amiodarone.   History of Present Illness:   Mr. Alan Diaz presented to ER yesterday with few days hx of dyspnea on exertion and orthopnea. Then developed nausea, vomiting and abdominal pain x 1 day leading to ER presentation. He is eating food high in salt. No family at bedside currently. No chest pain, palpitation, syncope or melena. He is not on PRN or daily diuretics prior to presentation.   In ER, BP 188/121, SCr 1.25, BNP 1385, CXR concerning of pneumonia. Given Abx, now discontinued. Troponin I of 0.03. Echo pending. Net I & O negative 2.3L on IV lasix 40mg  BID. His breathing is stable now. Less abdominal tightness.   EKG:  The EKG was personally reviewed and demonstrates:  SR with St depression in inferior lateral leads - no significant changes compared to prior  EKG Telemetry:  Telemetry was personally reviewed and demonstrates:  SR at rate of 50-60s.  Past Medical History:  Diagnosis Date  . CAD (coronary artery disease) 2016   a. NSTEMI 5/16 s/p DES to pLAD and dRCA // b. NSTEMI 4/18 >> 95% LM, oLAD stent ok, oLCx 80, mRCA stent ok, EF < 25 >> IABP // c. s/p CABG with Dr. Zenaida Niece Trig (L-LAD, S-OM, S-PDA)  . Diabetes mellitus   . GERD (gastroesophageal reflux disease)   . HLD (hyperlipidemia)   . Hypertension   . Ischemic cardiomyopathy    a. 2D ECHO 05/02/15 with EF 25-30%, mild LV dilation. Mild LVH, mod HKof the mid-apicalanteroseptal myocardium, G1DD // b. Mild LVH, EF 25, diffuse HK, grade 1 diastolic dysfunction, trivial MR // Echo 7/18: Mild concentric LVH, EF 25-30, inferior and inferolateral AK, grade 2 diastolic dysfunction, trivial MR/PI   . Tobacco abuse     Past Surgical History:  Procedure Laterality Date  . CARDIAC CATHETERIZATION N/A 05/03/2015   Procedure: Right/Left Heart Cath and Coronary Angiography;  Surgeon: Peter M Swaziland, MD;  Location: [facility];  Service: Cardiovascular;  Laterality: N/A;  . CARDIAC CATHETERIZATION N/A 05/03/2015   Procedure: Intravascular Ultrasound/IVUS;  Surgeon: Peter M Swaziland, MD;  Location: [facility];  Service: Cardiovascular;  Laterality: N/A;  . CARDIAC CATHETERIZATION N/A 05/03/2015   Procedure: Coronary Stent Intervention;  Surgeon: Peter M Swaziland, MD;  Location: [facility];  Service: Cardiovascular;  Laterality: N/A;  rca  . CORONARY ARTERY BYPASS GRAFT N/A 04/24/2017   Procedure: CORONARY  ARTERY BYPASS GRAFTING (CABG) times three using left intenal mammary artery and right saphenous vein;  Surgeon: Kerin Perna, MD;  Location: [facility] OR;  Service: Open Heart Surgery;  Laterality: N/A;  . IABP INSERTION N/A 04/24/2017   Procedure: IABP Insertion;  Surgeon: Marykay Lex, MD;  Location: [facility] INVASIVE CV LAB;  Service: Cardiovascular;  Laterality: N/A;  . LEFT  HEART CATH AND CORONARY ANGIOGRAPHY N/A 04/24/2017   Procedure: Left Heart Cath and Coronary Angiography;  Surgeon: Marykay Lex, MD;  Location: [facility] INVASIVE CV LAB;  Service: Cardiovascular;  Laterality: N/A;  . PERCUTANEOUS CORONARY STENT INTERVENTION (PCI-S)  05/03/2015   Procedure: Percutaneous Coronary Stent Intervention (Pci-S);  Surgeon: Peter M Swaziland, MD;  Location: [facility] INVASIVE CV LAB CUP[facility];  Service: Cardiovascular;;  Prox LAD  . TEE WITHOUT CARDIOVERSION N/A 04/24/2017   Procedure: TRANSESOPHAGEAL ECHOCARDIOGRAM (TEE);  Surgeon: Kerin Perna, MD;  Location: [facility] OR;  Service: Open Heart Surgery;  Laterality: N/A;  . WISDOM TOOTH EXTRACTION     bottom LT, top RT       Inpatient Medications: Scheduled Meds: . amiodarone  200 mg Oral Daily  . atorvastatin  80 mg Oral Daily  . carvedilol  12.5 mg Oral BID WC  . enoxaparin (LOVENOX) injection  40 mg Subcutaneous Daily  . furosemide  40 mg Intravenous BID  . insulin aspart  0-15 Units Subcutaneous TID WC  . insulin aspart  0-5 Units Subcutaneous QHS  . lisinopril  10 mg Oral Daily  . potassium chloride  20 mEq Oral BID  . spironolactone  12.5 mg Oral Daily   Continuous Infusions: . levofloxacin (LEVAQUIN) IV     PRN Meds: acetaminophen **OR** acetaminophen, ondansetron **OR** ondansetron (ZOFRAN) IV, promethazine **OR** promethazine **OR** promethazine, traMADol  Allergies:    Allergies  Allergen Reactions  . Aspirin Hives and Other (See Comments)    Makes pt feel funny  . Other Nausea And Vomiting and Other (See Comments)    Grape products    Social History:   Social History   Social History  . Marital status: Divorced    Spouse name: N/A  . Number of children: N/A  . Years of education: N/A   Occupational History  . Not on file.   Social History Main Topics  . Smoking status: Current Some Day Smoker    Packs/day: 0.10    Types: Cigarettes    Last attempt to quit: 05/03/2015  . Smokeless tobacco: Never Used   . Alcohol use No     Comment: very occasionally  . Drug use: No     Comment: denies 06/28/14  . Sexual activity: Not on file   Other Topics Concern  . Not on file   Social History Narrative  . No narrative on file    Family History:    Family History  Problem Relation Age of Onset  . Breast cancer Mother   . Heart disease Father   . Diabetes Sister   . Hypertension Sister   . Hypertension Brother   . Diabetes Brother      ROS:  Please see the history of present illness.  ROS All other ROS reviewed and negative.     Physical Exam/Data:   Vitals:   09/13/17 0830 09/13/17 0900 09/13/17 0930 09/13/17 1149  BP: 114/72 125/79 122/86 114/70  Pulse: 66 65 75 63  Resp:    18  Temp:    98.2 F (36.8 C)  TempSrc:    Oral  SpO2: 95% 98% 99% 99%  Weight:    187 lb 1.6 oz (84.9 kg)  Height:    6' (1.829 m)    Intake/Output Summary (Last 24 hours) at 09/13/17 1432 Last data filed at 09/13/17 1300  Gross per 24 hour  Intake              420 ml  Output             2760 ml  Net            -2340 ml   Filed Weights   09/12/17 1904 09/13/17 1149  Weight: 189 lb (85.7 kg) 187 lb 1.6 oz (84.9 kg)   Body mass index is 25.38 kg/m.  General:  Well nourished, well developed, in no acute distress HEENT: normal Lymph: no adenopathy Neck: +JVD Endocrine:  No thryomegaly Vascular: No carotid bruits; FA pulses 2+ bilaterally without bruits  Cardiac:  normal S1, S2; RRR; no murmur Lungs:  Left sided rales with diminished breath sound Abd: soft, nontender, no hepatomegaly  Ext: no edema Musculoskeletal:  No deformities, BUE and BLE strength normal and equal Skin: warm and dry  Neuro:  CNs 2-12 intact, no focal abnormalities noted Psych:  Normal affect     Relevant CV Studies: As above  Laboratory Data:  Chemistry Recent Labs Lab 09/12/17 1911  NA 137  K 4.0  CL 107  CO2 23  GLUCOSE 221*  BUN 13  CREATININE 1.40*  CALCIUM 8.8*  GFRNONAA 56*  GFRAA >60   ANIONGAP 7     Recent Labs Lab 09/12/17 1911  PROT 7.3  ALBUMIN 3.7  AST 24  ALT 20  ALKPHOS 93  BILITOT 0.6   Hematology Recent Labs Lab 09/12/17 1911 09/13/17 0752  WBC 5.3 8.0  RBC 4.28 4.41  HGB 13.4 14.0  HCT 39.5 40.6  MCV 92.3 92.1  MCH 31.3 31.7  MCHC 33.9 34.5  RDW 16.6* 16.5*  PLT 229 221   Cardiac Enzymes Recent Labs Lab 09/13/17 0940  TROPONINI 0.03*    Recent Labs Lab 09/12/17 1930  TROPIPOC 0.00    BNP Recent Labs Lab 09/12/17 2105  BNP 1,385.3*    DDimer No results for input(s): DDIMER in the last 168 hours.  Radiology/Studies:  Dg Chest 2 View  Result Date: 09/13/2017 CLINICAL DATA:  52 year old male with increase shortness of Breath. Nausea vomiting and abdominal pain. EXAM: CHEST  2 VIEW COMPARISON:  09/12/2017 and earlier. FINDINGS: Prior CABG. Stable cardiac size and mediastinal contours. Visualized tracheal air column is within normal limits. Bullous emphysema at the left lung base, as demonstrated by CT Abdomen and Pelvis 01/12/2017. Superimposed patchy left lower lobe opacity. Possible small left pleural effusion. Similar patchy opacity at the right lung base. No pulmonary edema. Stable upper lobe markings. Negative visible bowel gas pattern. No acute osseous abnormality identified. IMPRESSION: 1. Patchy bibasilar pulmonary opacity, worse on the left and superimposed on chronic left lower lobe emphysema. Possible small left pleural effusion. 2. Top differential considerations include multifocal bronchopneumonia and aspiration. Electronically Signed   By: Odessa Fleming M.D.   On: 09/13/2017 09:06   Dg Abd Acute W/chest  Result Date: 09/12/2017 CLINICAL DATA:  Shortness of breath and cough, abdominal pain, nausea, vomiting for few days. EXAM: DG ABDOMEN ACUTE W/ 1V CHEST COMPARISON:  Chest radiograph May 29, 2017 and CT abdomen and pelvis January 12, 2017 FINDINGS: Cardiac silhouette is mildly enlarged, mediastinal silhouette is nonsuspicious,  status post CABG. Patchy LEFT  lung base airspace opacities and bibasilar strandy densities. LEFT upper lobe pleural thickening is similar with scarring. No pneumothorax. Soft tissue planes and included osseous structures are nonsuspicious. Bowel gas pattern is nondilated and nonobstructive. No intra-abdominal mass effect. Punctate calcification projects in LEFT kidney. Phleboliths project in the pelvis. IMPRESSION: Mild cardiomegaly. Patchy LEFT lower lobe airspace opacity with underlying bullous changes concerning for pneumonia. Followup PA and lateral chest X-ray is recommended in 3-4 weeks following trial of antibiotic therapy to ensure resolution and exclude underlying malignancy. Mild cardiomegaly.  Mild chronic interstitial changes. Punctate LEFT nephrolithiasis. Normal bowel gas pattern. Electronically Signed   By: Awilda Metro M.D.   On: 09/12/2017 21:09    Assessment and Plan:   1. Acute on chronic combined CHF - BNP 1385. Symptoms improved on IV lasix. Net I & O negative 2.3L. Weigh down 2 LB. Likely due to excess salt intake. - Change lasix to  po tomorrow.   2. ICM/NSVT - - Patient was evaluated by Dr. Johney Frame for persistent low EF who recommended ICD however pt declined. He declined again today. Pending echo.  - Continue Coreg 12.5mg  BID, Lisinopril  qd and spironolactone 12.5mg  qd. Concern regarding compliance.  3. CAD s/p CABG - No chest pain. Continue ASA and statin.   4.Hypertensive heart disease - Elevated at presentation. Now normalized with resumption of medications.   5. HLD - 12/17/2016: VLDL 26 06/10/2017: Cholesterol, Total 138; HDL 47; LDL Calculated 71; Triglycerides 98  - continue statin  6. DM - Per primary team. ? Patient has PCP.   7. Tobacco abuse - Advised cessation. Education given.   For questions or updates, please contact [facility] Please consult [email or web address] for contact info under Cardiology/STEMI.   Lorelei Pont,  PA  09/13/2017 2:32 PM   I have seen and examined the patient along with Bhagat,Bhavinkumar, PA.  I have reviewed the chart, notes and new data.  I agree with PA's note.  Key new complaints: Feels much better, able to lie almost fully supine in bed without dyspnea. Key examination changes: Mild jugular venous distention, no leg edema, S3 present intermittently, very faint holosystolic murmur at left lower sternal border Key new findings / data: Reviewed echo images briefly at bedside. The ventricular ejection fraction remains severely depressed at around 25%, mitral regurgitation is mild. Doppler parameters show a supranormal mitral inflow and diastolic abdominal pulmonary vein flow consistent with volume overload even at this time.  PLAN: Continue diuretics. We will probably send him home on a higher dose of diuretics since I'm concerned that noncompliance with sodium stricturing will be a persistent problem. Discussed the risks of noncompliance with medications, especially beta blocker rebound. The severe hypertension present on admission that responded extremely promptly to reinitiation of medications suggests that this was part of the problem He continues to decline defibrillator therapy. I would continue IV diuretics today, switch to oral diuretics tomorrow and discharge home on furosemide 40 mg by mouth twice a day. Reinforced need for daily weight measurements, dietary sodium restriction, reviewed signs and symptoms of heart failure exacerbation.  Thurmon Fair, MD, [facility] [phone] 09/13/2017, 3:22 PM

## 2017-09-13 NOTE — ED Notes (Signed)
Attempted report 

## 2017-09-13 NOTE — ED Notes (Signed)
Attempted report X2 

## 2017-09-13 NOTE — Progress Notes (Signed)
Patient ID: Alan Diaz, male   DOB: [birth year], 52 y.o.   MRN: [id number] Patient was admitted early this morning for dyspnea. He has been started on IV Lasix for probable CHF exacerbation. I have reviewed the patient's past medical records and this morning's H&P myself. I evaluated the patient at bedside and discussed the plan of care. Repeat chest x-ray done today was reviewed and patient was started on Levaquin for probable pneumonia as well. I have placed a cardiology consult. 2-D echo is pending. Repeat labs in the morning.

## 2017-09-14 DIAGNOSIS — I255 Ischemic cardiomyopathy: Secondary | ICD-10-CM

## 2017-09-14 DIAGNOSIS — J189 Pneumonia, unspecified organism: Secondary | ICD-10-CM

## 2017-09-14 LAB — CBC WITH DIFFERENTIAL/PLATELET
Basophils Absolute: 0 10*3/uL (ref 0.0–0.1)
Basophils Relative: 0 %
EOS ABS: 0.2 10*3/uL (ref 0.0–0.7)
Eosinophils Relative: 3 %
HEMATOCRIT: 43.3 % (ref 39.0–52.0)
HEMOGLOBIN: 14.2 g/dL (ref 13.0–17.0)
LYMPHS ABS: 2.1 10*3/uL (ref 0.7–4.0)
LYMPHS PCT: 37 %
MCH: 30.3 pg (ref 26.0–34.0)
MCHC: 32.8 g/dL (ref 30.0–36.0)
MCV: 92.5 fL (ref 78.0–100.0)
MONOS PCT: 14 %
Monocytes Absolute: 0.8 10*3/uL (ref 0.1–1.0)
NEUTROS ABS: 2.6 10*3/uL (ref 1.7–7.7)
NEUTROS PCT: 46 %
Platelets: 205 10*3/uL (ref 150–400)
RBC: 4.68 MIL/uL (ref 4.22–5.81)
RDW: 16.2 % — ABNORMAL HIGH (ref 11.5–15.5)
WBC: 5.7 10*3/uL (ref 4.0–10.5)

## 2017-09-14 LAB — BASIC METABOLIC PANEL
Anion gap: 9 (ref 5–15)
BUN: 14 mg/dL (ref 6–20)
CHLORIDE: 102 mmol/L (ref 101–111)
CO2: 27 mmol/L (ref 22–32)
CREATININE: 1.38 mg/dL — AB (ref 0.61–1.24)
Calcium: 8.7 mg/dL — ABNORMAL LOW (ref 8.9–10.3)
GFR calc non Af Amer: 57 mL/min — ABNORMAL LOW (ref >60)
Glucose, Bld: 133 mg/dL — ABNORMAL HIGH (ref 65–99)
Potassium: 3.4 mmol/L — ABNORMAL LOW (ref 3.5–5.1)
Sodium: 138 mmol/L (ref 135–145)

## 2017-09-14 LAB — GLUCOSE, CAPILLARY
GLUCOSE-CAPILLARY: 241 mg/dL — AB (ref 65–99)
Glucose-Capillary: 136 mg/dL — ABNORMAL HIGH (ref 65–99)

## 2017-09-14 LAB — MAGNESIUM: Magnesium: 1.8 mg/dL (ref 1.7–2.4)

## 2017-09-14 MED ORDER — LEVOFLOXACIN 500 MG PO TABS: 500.0000 mg | ORAL_TABLET | Freq: Every day | ORAL | 0 refills | Status: AC

## 2017-09-14 MED ORDER — LEVOFLOXACIN 750 MG PO TABS: 750.0000 mg | ORAL_TABLET | ORAL | Status: DC

## 2017-09-14 MED ORDER — FUROSEMIDE 40 MG PO TABS: 40.0000 mg | ORAL_TABLET | Freq: Two times a day (BID) | ORAL | 0 refills | Status: DC

## 2017-09-14 MED ORDER — POTASSIUM CHLORIDE CRYS ER 20 MEQ PO TBCR: 20.0000 meq | EXTENDED_RELEASE_TABLET | Freq: Every day | ORAL | 0 refills | Status: DC

## 2017-09-14 NOTE — Discharge Summary (Signed)
Physician Discharge Summary  Alan Diaz WUJ:[id number] DOB: [birth year] DOA: 09/12/2017  PCP: Patient, No Pcp Per  Admit date: 09/12/2017 Discharge date: 09/14/2017  Admitted From: Home Disposition:  Home  Recommendations for Outpatient Follow-up:  1. Follow up with PCP in 1 week 2. Please obtain BMP in one week 3. Follow-up with cardiology/Dr. Dietrich Pates in 1-2 weeks 4. Follow-up with electrophysiology as scheduled 5. Comply with diet, medications and follow-up   Home Health: No  Equipment/Devices: None  Discharge Condition: Guarded  CODE STATUS: Stable  Diet recommendation: Heart Healthy / Carb Modified   Brief/Interim Summary: 52 year old male with history of coronary artery disease status post CABG in April 2018, ischemic cardiomyopathy with ejection fraction of 25% in July 2018, hypertension and NIDDM presented with cough and dyspnea. He was admitted with acute and chronic heart failure and started on diuretics. Cardiology was consulted.  Discharge Diagnoses:  Principal Problem:   Acute on chronic combined systolic and diastolic CHF (congestive heart failure) (HCC) Active Problems:   Type 2 diabetes mellitus with circulatory disorder (HCC)   Essential hypertension   CAD (coronary artery disease)   CKD (chronic kidney disease) stage 3, GFR 30-59 ml/min   NSVT (nonsustained ventricular tachycardia) (HCC)  1. Acute on chronic systolic and diastolic CHF:  - Currently on Lasix 40 mg IV twice a day. Cardiology evaluation appreciated. We'll switch to oral Lasix 40 mg twice a day as per cardiology recommendations. Continue Coreg, lisinopril and spironolactone - Comply with low-salt diet. Outpatient follow-up with cardiology with repeat BMP in a week - Echo still showed ejection fraction of 25%to 30%  2.  Probable community-acquired bilateral bacterial pneumonia  - Levaquin started. Continue Levaquin for 5 days upon discharge   3. NSVT:  -Continue amiodarone. Outpatient  follow-up with cardiology and/or EP   4. Hypertension   -Continue carvedilol, lisinopril, statin  5. Coronary artery disease status post CABG  - outpatient follow-up with cardiology. Continue with Coreg, lisinopril, statin, Lasix and spironolactone  6. Nausea and vomiting:  - Questionable cause; resolved   7. Diabetes:  -Hold metformin due to slightly elevated creatinine  -Outpatient follow-up  8. Chronic kidney disease:   creatinine almost at baseline. Outpatient follow-up  Discharge Instructions    Allergies as of 09/14/2017      Reactions   Aspirin Hives, Other (See Comments)   Makes pt feel funny   Other Nausea And Vomiting, Other (See Comments)   Grape products      Medication List    STOP taking these medications   metFORMIN 1000 MG tablet Commonly known as:  GLUCOPHAGE   traMADol 50 MG tablet Commonly known as:  ULTRAM     TAKE these medications   amiodarone 200 MG tablet Commonly known as:  PACERONE Take 1 tablet (200 mg total) by mouth daily.   atorvastatin 80 MG tablet Commonly known as:  LIPITOR Take 1 tablet (80 mg total) by mouth daily.   carvedilol 12.5 MG tablet Commonly known as:  COREG TAKE 1 TABLET BY MOUTH TWICE DAILY WITH A MEAL   furosemide 40 MG tablet Commonly known as:  LASIX Take 1 tablet (40 mg total) by mouth 2 (two) times daily.   levofloxacin 500 MG tablet Commonly known as:  LEVAQUIN Take 1 tablet (500 mg total) by mouth daily.   lisinopril 10 MG tablet Commonly known as:  PRINIVIL,ZESTRIL TAKE 1 TABLET BY MOUTH DAILY, MUST KEEP APPOINTMENT FOR ADDITIONAL REFILLS What changed:  See the new instructions.  potassium chloride SA 20 MEQ tablet Commonly known as:  K-DUR,KLOR-CON Take 1 tablet (20 mEq total) by mouth daily.   spironolactone 25 MG tablet Commonly known as:  ALDACTONE Take 0.5 tablets (12.5 mg total) by mouth daily.            Discharge Care Instructions        Start     Ordered   09/14/17  0000  potassium chloride SA (K-DUR,KLOR-CON) 20 MEQ tablet  Daily     09/14/17 1242   09/14/17 0000  furosemide (LASIX) 40 MG tablet  2 times daily     09/14/17 1242   09/14/17 0000  levofloxacin (LEVAQUIN) 500 MG tablet  Daily     09/14/17 1242   09/14/17 0000  Increase activity slowly     09/14/17 1242   09/14/17 0000  Diet - low sodium heart healthy     09/14/17 1242   09/14/17 0000  Diet Carb Modified     09/14/17 1242   09/14/17 0000  Call MD for:  temperature >100.4     09/14/17 1242   09/14/17 0000  Call MD for:  persistant nausea and vomiting     09/14/17 1242   09/14/17 0000  Call MD for:  severe uncontrolled pain     09/14/17 1242   09/14/17 0000  Call MD for:  difficulty breathing, headache or visual disturbances     09/14/17 1242   09/14/17 0000  Call MD for:  hives     09/14/17 1242   09/14/17 0000  Call MD for:  persistant dizziness or light-headedness     09/14/17 1242   09/14/17 0000  Call MD for:  extreme fatigue     09/14/17 1242   09/14/17 0000  (HEART FAILURE PATIENTS) Call MD:  Anytime you have any of the following symptoms: 1) 3 pound weight gain in 24 hours or 5 pounds in 1 week 2) shortness of breath, with or without a dry hacking cough 3) swelling in the hands, feet or stomach 4) if you have to sleep on extra pillows at night in order to breathe.     09/14/17 1242   09/14/17 0000  Ambulatory referral to Cardiology    Comments:  Follow up in 1 week with repeat BMP   09/14/17 1242       Follow-up Information    Pricilla Riffle, MD Follow up in 1 week(s).   Specialty:  Cardiology Why:  with repeat BMP Contact information: [address] [place] [address] [phone]          Allergies  Allergen Reactions  . Aspirin Hives and Other (See Comments)    Makes pt feel funny  . Other Nausea And Vomiting and Other (See Comments)    Grape products    Consultations: Cardiology  Procedures/Studies: Dg Chest 2 View  Result  Date: 09/13/2017 CLINICAL DATA:  52 year old male with increase shortness of Breath. Nausea vomiting and abdominal pain. EXAM: CHEST  2 VIEW COMPARISON:  09/12/2017 and earlier. FINDINGS: Prior CABG. Stable cardiac size and mediastinal contours. Visualized tracheal air column is within normal limits. Bullous emphysema at the left lung base, as demonstrated by CT Abdomen and Pelvis 01/12/2017. Superimposed patchy left lower lobe opacity. Possible small left pleural effusion. Similar patchy opacity at the right lung base. No pulmonary edema. Stable upper lobe markings. Negative visible bowel gas pattern. No acute osseous abnormality identified. IMPRESSION: 1. Patchy bibasilar pulmonary opacity, worse on the  left and superimposed on chronic left lower lobe emphysema. Possible small left pleural effusion. 2. Top differential considerations include multifocal bronchopneumonia and aspiration. Electronically Signed   By: Odessa Fleming M.D.   On: 09/13/2017 09:06   Dg Abd Acute W/chest  Result Date: 09/12/2017 CLINICAL DATA:  Shortness of breath and cough, abdominal pain, nausea, vomiting for few days. EXAM: DG ABDOMEN ACUTE W/ 1V CHEST COMPARISON:  Chest radiograph May 29, 2017 and CT abdomen and pelvis January 12, 2017 FINDINGS: Cardiac silhouette is mildly enlarged, mediastinal silhouette is nonsuspicious, status post CABG. Patchy LEFT lung base airspace opacities and bibasilar strandy densities. LEFT upper lobe pleural thickening is similar with scarring. No pneumothorax. Soft tissue planes and included osseous structures are nonsuspicious. Bowel gas pattern is nondilated and nonobstructive. No intra-abdominal mass effect. Punctate calcification projects in LEFT kidney. Phleboliths project in the pelvis. IMPRESSION: Mild cardiomegaly. Patchy LEFT lower lobe airspace opacity with underlying bullous changes concerning for pneumonia. Followup PA and lateral chest X-ray is recommended in 3-4 weeks following trial of  antibiotic therapy to ensure resolution and exclude underlying malignancy. Mild cardiomegaly.  Mild chronic interstitial changes. Punctate LEFT nephrolithiasis. Normal bowel gas pattern. Electronically Signed   By: Awilda Metro M.D.   On: 09/12/2017 21:09    Echo on 09/13/2017: Study Conclusions  - Left ventricle: The cavity size was severely dilated. Wall   thickness was increased in a pattern of mild LVH. Systolic   function was severely reduced. The estimated ejection fraction   was in the range of 25% to 30%. Diffuse hypokinesis. Features are   consistent with a pseudonormal left ventricular filling pattern,   with concomitant abnormal relaxation and increased filling   pressure (grade 2 diastolic dysfunction). Doppler parameters are   consistent with indeterminate ventricular filling pressure. - Aortic valve: Transvalvular velocity was within the normal range.   There was no stenosis. There was no regurgitation. - Mitral valve: Transvalvular velocity was within the normal range.   There was no evidence for stenosis. There was trivial   regurgitation. - Right ventricle: The cavity size was normal. Wall thickness was   normal. Systolic function was normal. - Atrial septum: No defect or patent foramen ovale was identified. - Tricuspid valve: There was mild regurgitation. - Pulmonary arteries: Systolic pressure was within the normal   range. PA peak pressure: 20 mm Hg (S).   Subjective:  Patient seen and examined at bedside. He denies any overnight fever, nausea or vomiting. He feels much better and most home. His shortness of breath is improving  Discharge Exam: Vitals:   09/14/17 0500 09/14/17 1005  BP: 132/76 107/71  Pulse: 63 68  Resp: 18   Temp: (!) 97.5 F (36.4 C)   SpO2: 99% 97%   Vitals:   09/13/17 1650 09/13/17 2009 09/14/17 0500 09/14/17 1005  BP: 124/76 101/72 132/76 107/71  Pulse: 69 65 63 68  Resp:  18 18   Temp:  98.8 F (37.1 C) (!) 97.5 F (36.4  C)   TempSrc:  Oral Oral   SpO2:  97% 99% 97%  Weight:   84.5 kg (186 lb 4.8 oz)   Height:        General: Pt is alert, awake, not in acute distress Cardiovascular: Rate controlled,S1/S2 + Respiratory:  Bilateral decreased breath sounds at bases with some basilar crackles Abdominal: Soft, NT, ND, bowel sounds + Extremities:  Trace edema, no cyanosis    The results of significant diagnostics from this hospitalization (including  imaging, microbiology, ancillary and laboratory) are listed below for reference.     Microbiology: No results found for this or any previous visit (from the past 240 hour(s)).   Labs: BNP (last 3 results)  Recent Labs  04/22/17 1255 09/12/17 2105  BNP 960.3* 1,385.3*   Basic Metabolic Panel:  Recent Labs Lab 09/12/17 1911 09/14/17 0557  NA 137 138  K 4.0 3.4*  CL 107 102  CO2 23 27  GLUCOSE 221* 133*  BUN 13 14  CREATININE 1.40* 1.38*  CALCIUM 8.8* 8.7*  MG  --  1.8   Liver Function Tests:  Recent Labs Lab 09/12/17 1911  AST 24  ALT 20  ALKPHOS 93  BILITOT 0.6  PROT 7.3  ALBUMIN 3.7    Recent Labs Lab 09/12/17 1911  LIPASE 24   No results for input(s): AMMONIA in the last 168 hours. CBC:  Recent Labs Lab 09/12/17 1911 09/13/17 0752 09/14/17 0557  WBC 5.3 8.0 5.7  NEUTROABS  --   --  2.6  HGB 13.4 14.0 14.2  HCT 39.5 40.6 43.3  MCV 92.3 92.1 92.5  PLT 229 221 205   Cardiac Enzymes:  Recent Labs Lab 09/13/17 0940  TROPONINI 0.03*   BNP: Invalid input(s): POCBNP CBG:  Recent Labs Lab 09/13/17 1216 09/13/17 1614 09/13/17 2106 09/14/17 0733 09/14/17 1128  GLUCAP 99 150* 221* 136* 241*   D-Dimer No results for input(s): DDIMER in the last 72 hours. Hgb A1c No results for input(s): HGBA1C in the last 72 hours. Lipid Profile No results for input(s): CHOL, HDL, LDLCALC, TRIG, CHOLHDL, LDLDIRECT in the last 72 hours. Thyroid function studies No results for input(s): TSH, T4TOTAL, T3FREE, THYROIDAB  in the last 72 hours.  Invalid input(s): FREET3 Anemia work up No results for input(s): VITAMINB12, FOLATE, FERRITIN, TIBC, IRON, RETICCTPCT in the last 72 hours. Urinalysis    Component Value Date/Time   COLORURINE YELLOW 09/13/2017 0115   APPEARANCEUR CLEAR 09/13/2017 0115   LABSPEC 1.012 09/13/2017 0115   PHURINE 5.0 09/13/2017 0115   GLUCOSEU 50 (A) 09/13/2017 0115   HGBUR NEGATIVE 09/13/2017 0115   HGBUR negative 02/23/2009 1113   BILIRUBINUR NEGATIVE 09/13/2017 0115   KETONESUR 5 (A) 09/13/2017 0115   PROTEINUR 100 (A) 09/13/2017 0115   UROBILINOGEN 0.2 05/04/2015 1314   NITRITE NEGATIVE 09/13/2017 0115   LEUKOCYTESUR NEGATIVE 09/13/2017 0115   Sepsis Labs Invalid input(s): PROCALCITONIN,  WBC,  LACTICIDVEN Microbiology No results found for this or any previous visit (from the past 240 hour(s)).   Time coordinating discharge: 35 minutes  SIGNED:   Glade Lloyd, MD  [facility] 09/14/2017, 11:38 AM Pager: [phone]  If 7PM-7AM, please contact night-coverage [email or web address] Password [email or web address]

## 2017-09-14 NOTE — Progress Notes (Signed)
Discharge instructions reviewed with patient, questions answered, verbalized understanding.   [facility] called this RN with questions regarding use of Levaquin and Amiodarone.  Spoke with Dr. Hanley Ben regarding this, no changes to discharge regimen made.  Patient transported via wheelchairto  front of hospital to be taken home by wife.

## 2017-10-09 ENCOUNTER — Emergency Department (HOSPITAL_COMMUNITY): Admission: EM | Admit: 2017-10-09 | Payer: Medicaid Other | Source: Home / Self Care

## 2017-10-11 ENCOUNTER — Ambulatory Visit: Payer: Medicaid Other | Admitting: Physician Assistant

## 2017-10-11 NOTE — Progress Notes (Deleted)
Cardiology Office Note:    Date:  10/11/2017   ID:  Alan Diaz, DOB 08-28-1965, MRN [id number]  PCP:  Patient, No Pcp Per  Cardiologist:  Dr. Dietrich Pates    Referring MD: Glade Lloyd, MD   No chief complaint on file. ***  History of Present Illness:    Alan Diaz is a 52 y.o. male with a hx of CAD (s/p DES to pLAD and dRCA in 05/2015), ischemic cardiomyopathy, chronic combined systolic and diastolic CHF (EF previously 25-30%, improved to 35-40% by echo in 08/2016), Diabetes, HTN, HLD, and tobacco use.   He was admitted 4/18 with a non-STEMI acute on chronic combined systolic and diastolic heart failure.  EF is 25% on echocardiogram.  He had severe left main stenosis on cardiac catheterization and underwent CABG LIMA-LAD, SVG-OM, SVG-PDA.  He did have postoperative nonsustained ventricular tachycardia.  He was placed on amiodarone.  Follow-up echocardiogram in 7/18 continued to demonstrate severe LV dysfunction.  He was seen by Dr. Johney Frame for EP consultation in 8/18.  ICD implantation was recommended.  The patient was not ready to proceed at that time.    The patient was admitted 9/13-9/15 with acute on chronic combined systolic and diastolic heart failure.  Blood pressure was significantly elevated upon admission.  He was followed by cardiology.  Outpatient diuretic dose was adjusted.  Alan Diaz ***  Prior CV studies:   The following studies were reviewed today:  *** Echocardiogram 09/13/17 Mild LVH, EF 25-30, diffuse HK, grade 2 diastolic dysfunction, trivial MR, mild TR, PASP 20  Echocardiogram 07/25/17 Mild concentric LVH, EF 25-30, severe diffuse HK, inferior and inferolateral akinesis, grade 2 diastolic dysfunction, trivial MR  Intraoperative TEE 04/24/17 1+ MR, 1+ TR, severe LV dysfunction  LHC 04/24/17 LM 95 LAD ostial stent patent, mid 40 LCx ostial 80 RCA proximal 40, mid stent patent EF <25 IABP placed  Echo 04/24/17 Mild LVH, EF 25, diffuse HK, grade 1  diastolic dysfunction, trivial MR  Echo 09/10/16 EF 35-40, diffuse HK, grade 2 diastolic dysfunction  Past Medical History:  Diagnosis Date  . CAD (coronary artery disease) 2016   a. NSTEMI 5/16 s/p DES to pLAD and dRCA // b. NSTEMI 4/18 >> 95% LM, oLAD stent ok, oLCx 80, mRCA stent ok, EF < 25 >> IABP // c. s/p CABG with Dr. Zenaida Niece Trig (L-LAD, S-OM, S-PDA)  . Diabetes mellitus   . GERD (gastroesophageal reflux disease)   . HLD (hyperlipidemia)   . Hypertension   . Ischemic cardiomyopathy    a. 2D ECHO 05/02/15 with EF 25-30%, mild LV dilation. Mild LVH, mod HKof the mid-apicalanteroseptal myocardium, G1DD // b. Mild LVH, EF 25, diffuse HK, grade 1 diastolic dysfunction, trivial MR // Echo 7/18: Mild concentric LVH, EF 25-30, inferior and inferolateral AK, grade 2 diastolic dysfunction, trivial MR/PI   . Tobacco abuse     Past Surgical History:  Procedure Laterality Date  . CARDIAC CATHETERIZATION N/A 05/03/2015   Procedure: Right/Left Heart Cath and Coronary Angiography;  Surgeon: Peter M Swaziland, MD;  Location: [facility] INVASIVE CV LAB CUP[facility];  Service: Cardiovascular;  Laterality: N/A;  . CARDIAC CATHETERIZATION N/A 05/03/2015   Procedure: Intravascular Ultrasound/IVUS;  Surgeon: Peter M Swaziland, MD;  Location: [facility]VAS[facility] CV [facility];  Service: Cardiovascular;  Laterality: N/A;  . CARDIAC CATHETERIZATION N/A 05/03/2015   Procedure: Coronary Stent Intervention;  Surgeon: Peter M Swaziland, MD;  Location: [facility];  Service: Cardiovascular;  Laterality: N/A;  rca  . CORONARY  ARTERY BYPASS GRAFT N/A 04/24/2017   Procedure: CORONARY ARTERY BYPASS GRAFTING (CABG) times three using left intenal mammary artery and right saphenous vein;  Surgeon: Kerin Perna, MD;  Location: [facility] OR;  Service: Open Heart Surgery;  Laterality: N/A;  . IABP INSERTION N/A 04/24/2017   Procedure: IABP Insertion;  Surgeon: Marykay Lex, MD;  Location: [facility] INVASIVE CV LAB;  Service: Cardiovascular;  Laterality: N/A;    . LEFT HEART CATH AND CORONARY ANGIOGRAPHY N/A 04/24/2017   Procedure: Left Heart Cath and Coronary Angiography;  Surgeon: Marykay Lex, MD;  Location: [facility] INVASIVE CV LAB;  Service: Cardiovascular;  Laterality: N/A;  . PERCUTANEOUS CORONARY STENT INTERVENTION (PCI-S)  05/03/2015   Procedure: Percutaneous Coronary Stent Intervention (Pci-S);  Surgeon: Peter M Swaziland, MD;  Location: [facility] INVASIVE CV LAB CUPID;  Service: Cardiovascular;;  Prox LAD  . TEE WITHOUT CARDIOVERSION N/A 04/24/2017   Procedure: TRANSESOPHAGEAL ECHOCARDIOGRAM (TEE);  Surgeon: Kerin Perna, MD;  Location: [facility] OR;  Service: Open Heart Surgery;  Laterality: N/A;  . WISDOM TOOTH EXTRACTION     bottom LT, top RT    Current Medications: No outpatient prescriptions have been marked as taking for the 10/11/17 encounter (Appointment) with Tereso Newcomer T, PA-C.     Allergies:   Aspirin and Other   Social History   Social History  . Marital status: Divorced    Spouse name: N/A  . Number of children: N/A  . Years of education: N/A   Social History Main Topics  . Smoking status: Current Some Day Smoker    Packs/day: 0.10    Types: Cigarettes    Last attempt to quit: 05/03/2015  . Smokeless tobacco: Never Used  . Alcohol use No     Comment: very occasionally  . Drug use: No     Comment: denies 06/28/14  . Sexual activity: Not on file   Other Topics Concern  . Not on file   Social History Narrative  . No narrative on file     Family Hx: The patient's family history includes Breast cancer in his mother; Diabetes in his brother and sister; Heart disease in his father; Hypertension in his brother and sister.  ROS:   Please see the history of present illness.    ROS All other systems reviewed and are negative.   EKGs/Labs/Other Test Reviewed:    EKG:  EKG is *** ordered today.  The ekg ordered today demonstrates ***  Recent Labs: 06/10/2017: TSH 1.270 09/12/2017: ALT 20; B Natriuretic Peptide  1,385.3 09/14/2017: BUN 14; Creatinine, Ser 1.38; Hemoglobin 14.2; Magnesium 1.8; Platelets 205; Potassium 3.4; Sodium 138   Recent Lipid Panel Lab Results  Component Value Date/Time   CHOL 138 06/10/2017 12:19 PM   TRIG 98 06/10/2017 12:19 PM   HDL 47 06/10/2017 12:19 PM   CHOLHDL 2.9 06/10/2017 12:19 PM   CHOLHDL 5.4 (H) 12/17/2016 11:20 AM   LDLCALC 71 06/10/2017 12:19 PM    Physical Exam:    VS:  There were no vitals taken for this visit.    Wt Readings from Last 3 Encounters:  09/14/17 186 lb 4.8 oz (84.5 kg)  08/19/17 193 lb 3.2 oz (87.6 kg)  08/14/17 192 lb (87.1 kg)     ***Physical Exam  ASSESSMENT:    1. Chronic combined systolic and diastolic CHF (congestive heart failure) (HCC)   2. Coronary artery disease involving native coronary artery of native heart without angina pectoris   3. NSVT (nonsustained ventricular tachycardia) (HCC)  4. CKD (chronic kidney disease) stage 3, GFR 30-59 ml/min (HCC)   5. Essential hypertension   6. Pure hypercholesterolemia    PLAN:    In order of problems listed above:  Chronic combined systolic and diastolic CHF (congestive heart failure) (HCC)  Coronary artery disease involving native coronary artery of native heart without angina pectoris  NSVT (nonsustained ventricular tachycardia) (HCC)  CKD (chronic kidney disease) stage 3, GFR 30-59 ml/min (HCC)  Essential hypertension  Pure hypercholesterolemia*** 2. Coronary artery disease involving native coronary artery of native heart without angina pectoris - Status post prior PCI.  s/p non-STEMI 4/18 and subsequent CABG.  He is progressing well. He sees Dr. Donata Clay later this month. Continue beta blocker, statin, Plavix. Allergic to aspirin*** 3. Chronic combined systolic and diastolic CHF (congestive heart failure) (HCC) - Volume appears stable on exam. Continue beta blocker, ACE inhibitor. Add spironolactone 12.5 mg daily. Check BMET today and repeat every week  2.  4. NSVT (nonsustained ventricular tachycardia) (HCC) - Continue amiodarone. Reduce amiodarone dose to 200 mg daily. Plan TSH, LFTs in 6 weeks.  5. Cardiomyopathy, ischemic - EF 25%. Plan repeat echo 90 days post CABG. If EF <35, refer to EP for ICD.  6. Essential hypertension - Blood pressure controlled.  7. Pure hypercholesterolemia - Patient should be on high-dose statin post ACS and bypass surgery. Increase Lipitor to 80 mg daily. Plan lipids and LFTs in 6 weeks.  8. CKD (chronic kidney disease) stage 3, GFR 30-59 ml/min - Obtain BMET today and repeat every week 2 given addition of spironolactone.  Dispo:  No Follow-up on file.   Medication Adjustments/Labs and Tests Ordered: Current medicines are reviewed at length with the patient today.  Concerns regarding medicines are outlined above.  Tests Ordered: No orders of the defined types were placed in this encounter.  Medication Changes: No orders of the defined types were placed in this encounter.   Signed, Tereso Newcomer, PA-C  10/11/2017 8:38 AM    [facility] [address] [place], [place], [place]  [address] Phone: [phone]; Fax: [phone]

## 2017-10-15 ENCOUNTER — Encounter (HOSPITAL_COMMUNITY): Payer: Self-pay | Admitting: *Deleted

## 2017-10-15 ENCOUNTER — Other Ambulatory Visit: Payer: Self-pay

## 2017-10-15 DIAGNOSIS — Z79899 Other long term (current) drug therapy: Secondary | ICD-10-CM | POA: Diagnosis not present

## 2017-10-15 DIAGNOSIS — Y9389 Activity, other specified: Secondary | ICD-10-CM | POA: Diagnosis not present

## 2017-10-15 DIAGNOSIS — S161XXA Strain of muscle, fascia and tendon at neck level, initial encounter: Secondary | ICD-10-CM | POA: Insufficient documentation

## 2017-10-15 DIAGNOSIS — F17210 Nicotine dependence, cigarettes, uncomplicated: Secondary | ICD-10-CM | POA: Diagnosis not present

## 2017-10-15 DIAGNOSIS — I2510 Atherosclerotic heart disease of native coronary artery without angina pectoris: Secondary | ICD-10-CM | POA: Diagnosis not present

## 2017-10-15 DIAGNOSIS — I959 Hypotension, unspecified: Secondary | ICD-10-CM | POA: Insufficient documentation

## 2017-10-15 DIAGNOSIS — S20212A Contusion of left front wall of thorax, initial encounter: Secondary | ICD-10-CM | POA: Insufficient documentation

## 2017-10-15 DIAGNOSIS — E119 Type 2 diabetes mellitus without complications: Secondary | ICD-10-CM | POA: Insufficient documentation

## 2017-10-15 DIAGNOSIS — Y999 Unspecified external cause status: Secondary | ICD-10-CM | POA: Insufficient documentation

## 2017-10-15 DIAGNOSIS — Y92481 Parking lot as the place of occurrence of the external cause: Secondary | ICD-10-CM | POA: Diagnosis not present

## 2017-10-15 DIAGNOSIS — S29001A Unspecified injury of muscle and tendon of front wall of thorax, initial encounter: Secondary | ICD-10-CM | POA: Diagnosis present

## 2017-10-15 DIAGNOSIS — I10 Essential (primary) hypertension: Secondary | ICD-10-CM | POA: Insufficient documentation

## 2017-10-15 LAB — I-STAT TROPONIN, ED: Troponin i, poc: 0.02 ng/mL (ref 0.00–0.08)
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
## 2017-10-16 ENCOUNTER — Emergency Department (HOSPITAL_COMMUNITY)
Admission: EM | Admit: 2017-10-16 | Discharge: 2017-10-16 | Disposition: A | Discharge status: Home / Self Care | Payer: Medicaid Other | Attending: Emergency Medicine | Admitting: Emergency Medicine

## 2017-10-16 ENCOUNTER — Emergency Department (HOSPITAL_COMMUNITY): Payer: Medicaid Other

## 2017-10-16 DIAGNOSIS — I959 Hypotension, unspecified: Secondary | ICD-10-CM

## 2017-10-16 DIAGNOSIS — S161XXA Strain of muscle, fascia and tendon at neck level, initial encounter: Secondary | ICD-10-CM

## 2017-10-16 DIAGNOSIS — S20212A Contusion of left front wall of thorax, initial encounter: Secondary | ICD-10-CM

## 2017-10-16 LAB — CBC WITH DIFFERENTIAL/PLATELET
BASOS ABS: 0 10*3/uL (ref 0.0–0.1)
BASOS PCT: 0 %
Eosinophils Absolute: 0.2 10*3/uL (ref 0.0–0.7)
Eosinophils Relative: 4 %
HEMATOCRIT: 39.2 % (ref 39.0–52.0)
HEMOGLOBIN: 13.1 g/dL (ref 13.0–17.0)
LYMPHS PCT: 43 %
Lymphs Abs: 2.3 10*3/uL (ref 0.7–4.0)
MCH: 32.3 pg (ref 26.0–34.0)
MCHC: 33.4 g/dL (ref 30.0–36.0)
MCV: 96.6 fL (ref 78.0–100.0)
Monocytes Absolute: 0.5 10*3/uL (ref 0.1–1.0)
Monocytes Relative: 9 %
NEUTROS ABS: 2.4 10*3/uL (ref 1.7–7.7)
NEUTROS PCT: 44 %
Platelets: 183 10*3/uL (ref 150–400)
RBC: 4.06 MIL/uL — AB (ref 4.22–5.81)
RDW: 14.7 % (ref 11.5–15.5)
WBC: 5.3 10*3/uL (ref 4.0–10.5)

## 2017-10-16 LAB — URINALYSIS, ROUTINE W REFLEX MICROSCOPIC
BACTERIA UA: NONE SEEN
BILIRUBIN URINE: NEGATIVE
GLUCOSE, UA: NEGATIVE mg/dL
HGB URINE DIPSTICK: NEGATIVE
Ketones, ur: NEGATIVE mg/dL
LEUKOCYTES UA: NEGATIVE
NITRITE: NEGATIVE
PROTEIN: 30 mg/dL — AB
RBC / HPF: NONE SEEN RBC/hpf (ref 0–5)
Specific Gravity, Urine: 1.013 (ref 1.005–1.030)
Squamous Epithelial / LPF: NONE SEEN
pH: 5 (ref 5.0–8.0)

## 2017-10-16 LAB — BASIC METABOLIC PANEL
ANION GAP: 9 (ref 5–15)
BUN: 25 mg/dL — ABNORMAL HIGH (ref 6–20)
CALCIUM: 9 mg/dL (ref 8.9–10.3)
CHLORIDE: 99 mmol/L — AB (ref 101–111)
CO2: 26 mmol/L (ref 22–32)
Creatinine, Ser: 1.73 mg/dL — ABNORMAL HIGH (ref 0.61–1.24)
GFR calc non Af Amer: 43 mL/min — ABNORMAL LOW (ref >60)
GFR, EST AFRICAN AMERICAN: 50 mL/min — AB (ref >60)
Glucose, Bld: 276 mg/dL — ABNORMAL HIGH (ref 65–99)
POTASSIUM: 3.9 mmol/L (ref 3.5–5.1)
Sodium: 134 mmol/L — ABNORMAL LOW (ref 135–145)

## 2017-10-16 LAB — I-STAT CG4 LACTIC ACID, ED: Lactic Acid, Venous: 1.76 mmol/L (ref 0.5–1.9)

## 2017-10-16 MED ORDER — CYCLOBENZAPRINE HCL 5 MG PO TABS: 5.0000 mg | ORAL_TABLET | Freq: Three times a day (TID) | ORAL | 0 refills | Status: DC | PRN

## 2017-10-16 NOTE — Discharge Instructions (Signed)
Your chest x-ray today was normal. EKG and cardiac labs were also normal. Your blood pressure was found to be slightly low which may be from mild dehydration. Your kidney function was mildly elevated at 1.73. I recommend close follow-up with your primary care physician and cardiologist in the next several days to recheck your blood pressure and kidney function. If you began feeling lightheaded, short of breath, have chest pressure or chest tightness, fever, please return to the hospital.

## 2017-10-16 NOTE — ED Notes (Signed)
PT states understanding of care given, follow up care, and medication prescribed. PT ambulated from ED to car with a steady gait. 

## 2017-10-16 NOTE — ED Notes (Signed)
Pt able to drink fluids with no problem 
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
## 2017-10-17 IMAGING — CR DG CHEST 2V
2 series · 2 of 2 positions shown · non-contrast
Comparison: 04/30/2017; 04/27/2017; CT abdomen and pelvis -
01/12/2019

CLINICAL DATA: Post CABG 3.5 weeks ago.

EXAM:
CHEST  2 VIEW

[w chest pa]
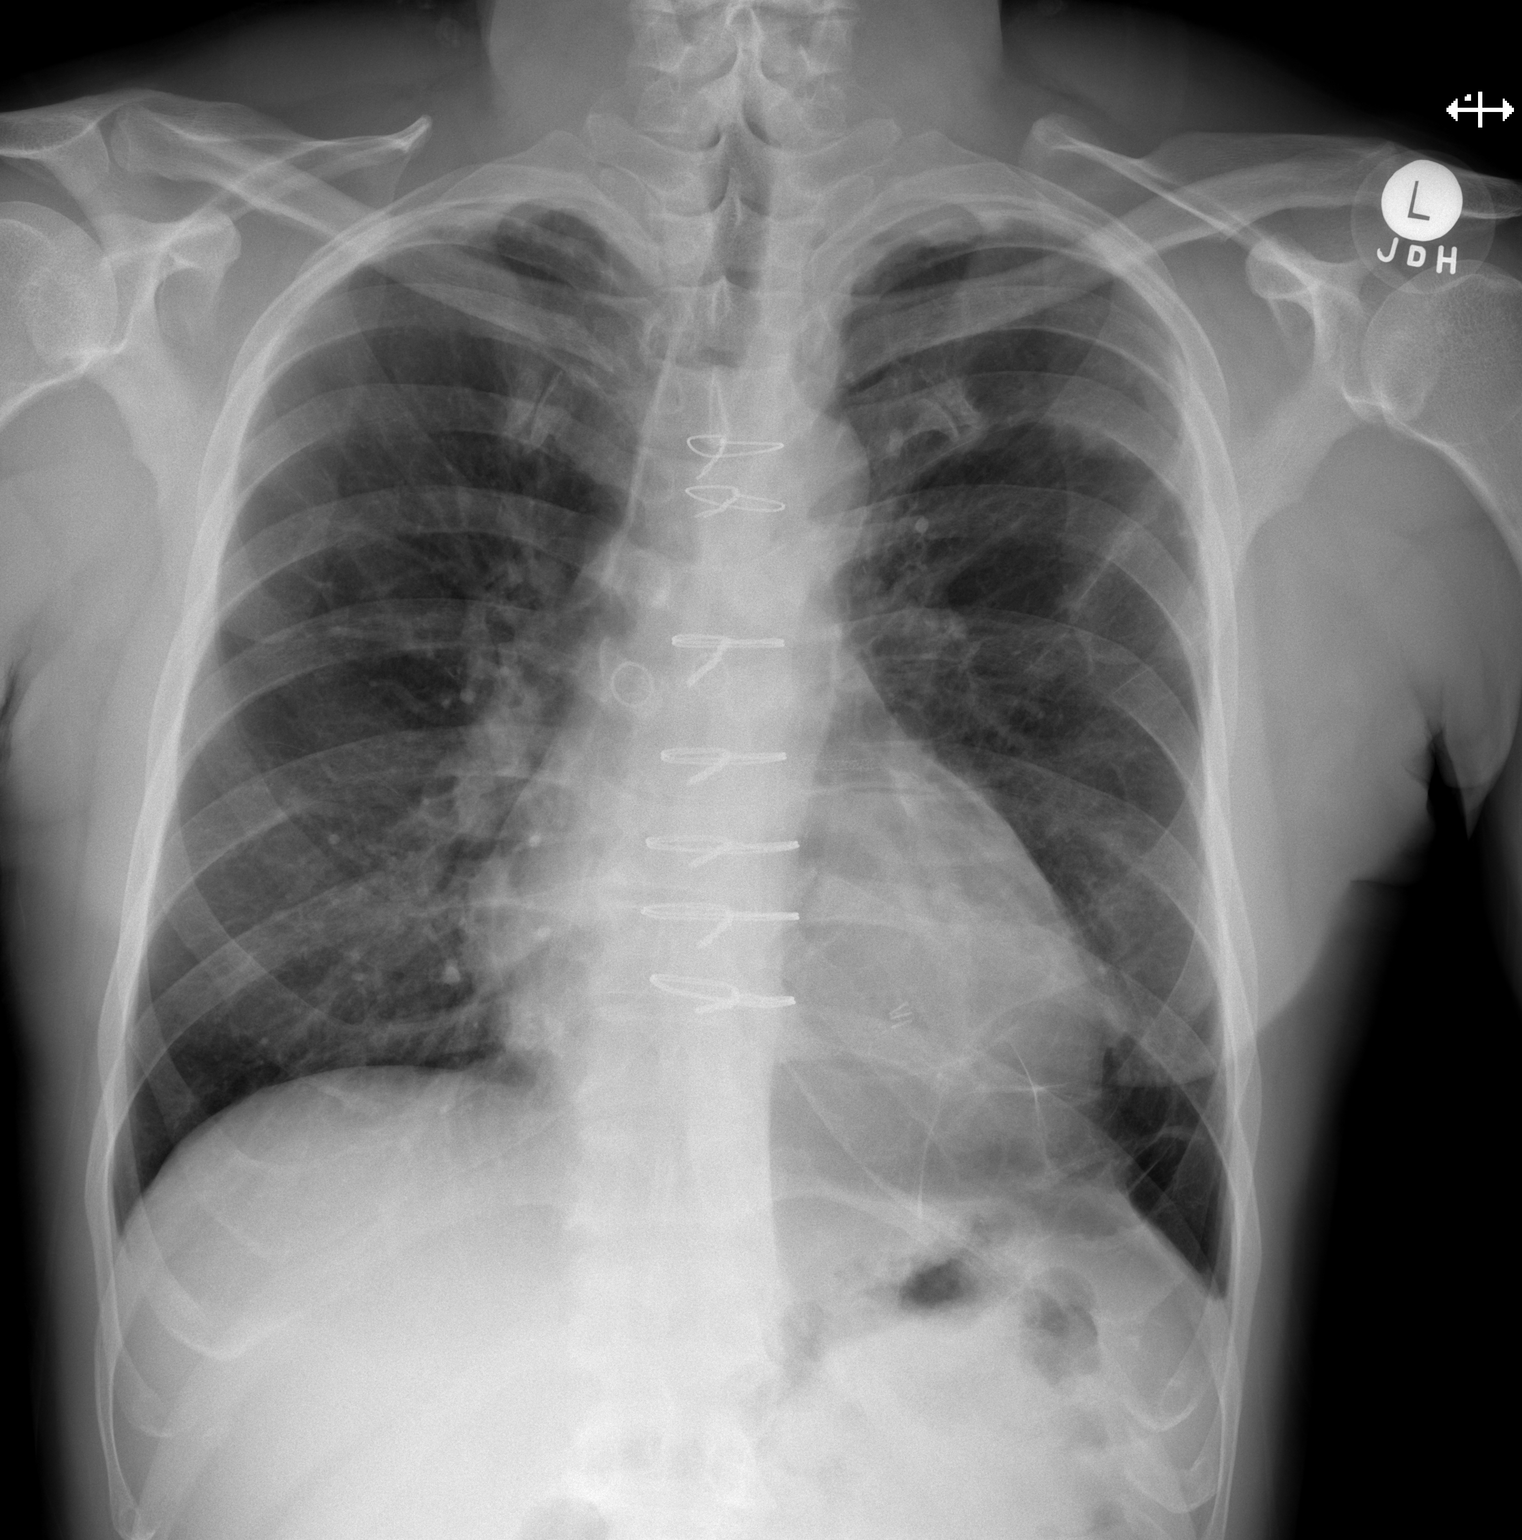

[w chest lat]
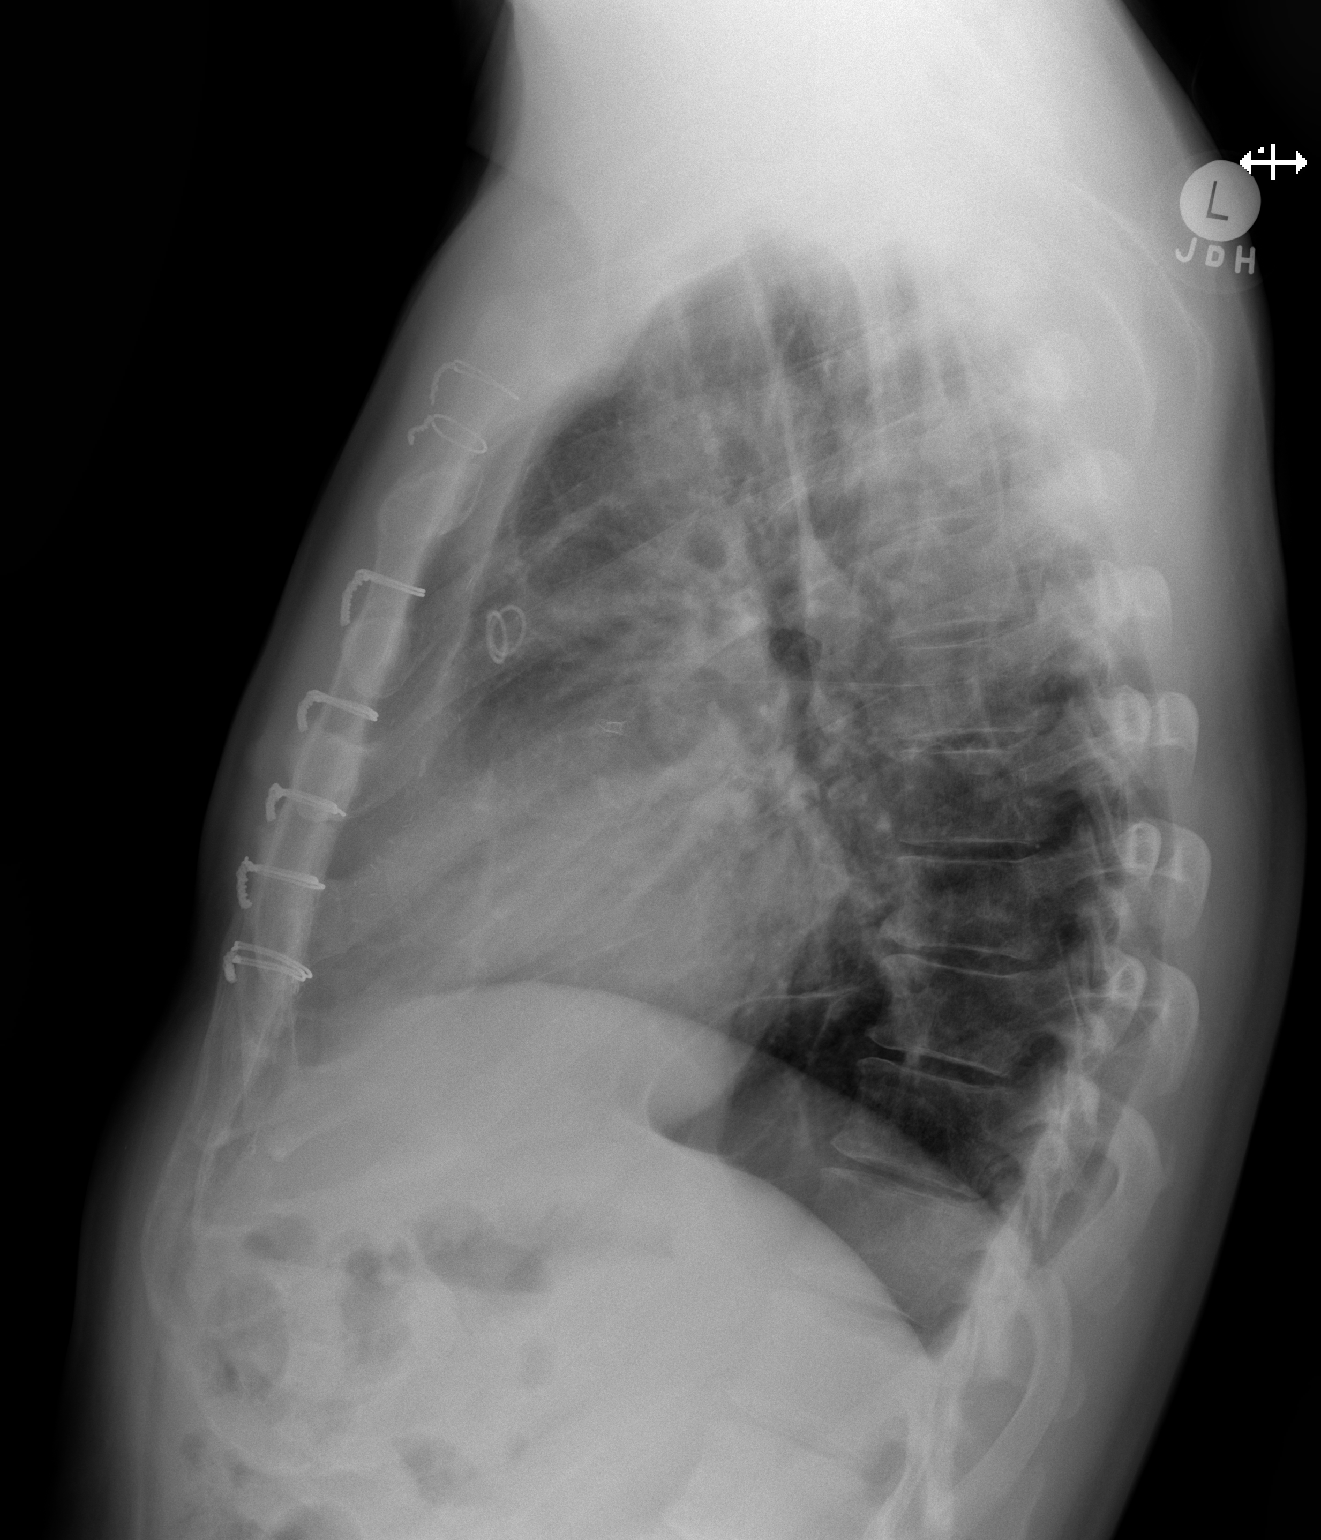

[2 of 2 positions shown; findings below may reference images not displayed]

FINDINGS: Grossly unchanged cardiac silhouette and mediastinal contours post
median sternotomy and CABG. Interval removal of right upper
extremity approach PICC line. Improved aeration of the lungs. The
lungs remain hyperexpanded with flattening of the diaphragms and
bullous emphysematous change within the left lower lobe as
demonstrated on prior abdominal CT. No focal airspace opacities. No
pleural effusion or pneumothorax. No evidence of edema. No acute
osseous abnormalities.
IMPRESSION: 1. Stable sequela of median sternotomy and CABG without superimposed
acute cardiopulmonary disease.
2. Aortic Atherosclerosis (QT9UP-FNC.C) and Emphysema (QT9UP-218.W).

## 2017-10-22 ENCOUNTER — Encounter: Payer: Self-pay | Admitting: Physician Assistant

## 2017-10-22 ENCOUNTER — Ambulatory Visit: Payer: Medicaid Other | Admitting: Physician Assistant

## 2017-10-22 ENCOUNTER — Ambulatory Visit (INDEPENDENT_AMBULATORY_CARE_PROVIDER_SITE_OTHER): Payer: Medicaid Other | Admitting: Physician Assistant

## 2017-10-22 VITALS — BP 110/70 | HR 63 | Ht 72.0 in | Wt 188.8 lb

## 2017-10-22 DIAGNOSIS — I2510 Atherosclerotic heart disease of native coronary artery without angina pectoris: Secondary | ICD-10-CM

## 2017-10-22 DIAGNOSIS — I472 Ventricular tachycardia: Secondary | ICD-10-CM | POA: Diagnosis not present

## 2017-10-22 DIAGNOSIS — I5042 Chronic combined systolic (congestive) and diastolic (congestive) heart failure: Secondary | ICD-10-CM | POA: Diagnosis not present

## 2017-10-22 DIAGNOSIS — I4729 Other ventricular tachycardia: Secondary | ICD-10-CM

## 2017-10-22 DIAGNOSIS — N183 Chronic kidney disease, stage 3 unspecified: Secondary | ICD-10-CM

## 2017-10-22 MED ORDER — AMIODARONE HCL 200 MG PO TABS: 200.0000 mg | ORAL_TABLET | Freq: Every day | ORAL | 11 refills | Status: DC

## 2017-10-22 MED ORDER — SACUBITRIL-VALSARTAN 24-26 MG PO TABS: 1.0000 | ORAL_TABLET | Freq: Two times a day (BID) | ORAL | 3 refills | Status: DC

## 2017-10-22 MED ORDER — FUROSEMIDE 40 MG PO TABS: 40.0000 mg | ORAL_TABLET | Freq: Every day | ORAL | 3 refills | Status: DC

## 2017-10-22 NOTE — Patient Instructions (Addendum)
Medication Instructions:  1. STOP LISINOPRIL  2. START ENTRESTO 24/26 MG 1 TABLET TWICE DAILY; RX HAS BEEN SENT IN; YOUR FIRST DOSE WILL BE TOMORROW MORNING 10/23/17; YOU HAVE BEEN GIVEN SAMPLES TODAY  3. A REFILL FOR AMIODARONE HAS BEEN SENT IN  4. DECREASE LASIX TO 40 MG DAILY  Labwork: 1. TODAY BMET  2. IN 1 WEEK YOU WILL NEED REPEAT BMET DUE TO MEDICATION CHANGES   Testing/Procedures: NONE ORDERED TODAY  Follow-Up: DR. Tenny Craw 01/23/18 @ 12 PM (NOON TIME)  Any Other Special Instructions Will Be Listed Below (If Applicable).  MONITOR WEIGHT DAILY AND CALL IF YOUR WEIGHT IS UP 3 LB'S IN 1 DAY OR YOU HAVE INCREASED SHORTNESS OF BREATH AFTER MAKING THESE MEDICATION CHANGES: [phone]   If you need a refill on your cardiac medications before your next appointment, please call your pharmacy.

## 2017-10-22 NOTE — Progress Notes (Signed)
Cardiology Office Note:    Date:  10/22/2017   ID:  Alan Diaz, DOB [birth year], MRN [id number]  PCP:  Patient, No Pcp Per  Cardiologist:  Dr. Dietrich PatesPaula Ross Electrophysiologist:  Dr. Hillis RangeJames Allred (eval for ICD 07/2017)    Referring MD: Glade LloydAlekh, Kshitiz, MD   Chief Complaint  Patient presents with  . Hospitalization Follow-up    CHF    History of Present Illness:    Alan LuzRodney Diaz is a 52 y.o. male with a hx of coronary artery disease (s/p DES to pLAD and dRCA in 05/2015), ischemic cardiomyopathy, chronic combined systolic and diastolic CHF (EF previously 25-30%, improved to 35-40% by echo in 08/2016), diabetes, hypertension, hyperlipidemia and tobacco use.   He was admitted 4/18 with a non-STEMI and acute on chronic combined systolic and diastolic heart failure.  EF was 25% on echocardiogram.  He had severe left main stenosis on cardiac catheterization and underwent coronary artery bypass grafting with LIMA-LAD, SVG-OM, SVG-PDA.  He did have postoperative nonsustained ventricular tachycardia.  He was placed on amiodarone.  Follow-up echocardiogram in 7/18 continued to demonstrate severe LV dysfunction.  He was seen by Dr. Johney FrameAllred for EP consultation in 8/18.  ICD implantation was recommended.  The patient was not ready to proceed at that time.    The patient was admitted 9/13-9/15 with acute on chronic combined systolic and diastolic heart failure.  Blood pressure was significantly elevated upon admission.  He was evaluated by cardiology.  Outpatient diuretic dose was adjusted.  Mr. Alan Diaz returns for posthospitalization follow-up.  His breathing is improved.  He denies paroxysmal nocturnal dyspnea or lower extremity edema.  He denies chest discomfort.  He denies syncope.  He does smoke cigarettes.  His breathing seems to change around this time of year.  He denies any bleeding issues.  Prior CV studies:   The following studies were reviewed today:  Echocardiogram 09/13/17 Mild LVH, EF  25-30, diffuse HK, grade 2 diastolic dysfunction, trivial MR, mild TR, PASP 20  Echocardiogram 07/25/17 Mild concentric LVH, EF 25-30, severe diffuse HK, inferior and inferolateral akinesis, grade 2 diastolic dysfunction, trivial MR  Intraoperative TEE 04/24/17 1+ MR, 1+ TR, severe LV dysfunction  LHC 04/24/17 LM 95 LAD ostial stent patent, mid 40 LCx ostial 80 RCA proximal 40, mid stent patent EF <25 IABP placed  Echo 04/24/17 Mild LVH, EF 25, diffuse HK, grade 1 diastolic dysfunction, trivial MR  Echo 09/10/16 EF 35-40, diffuse HK, grade 2 diastolic dysfunction  Past Medical History:  Diagnosis Date  . CAD (coronary artery disease) 2016   a. NSTEMI 5/16 s/p DES to pLAD and dRCA // b. NSTEMI 4/18 >> 95% LM, oLAD stent ok, oLCx 80, mRCA stent ok, EF < 25 >> IABP // c. s/p CABG with Dr. Zenaida NieceVan Trig (L-LAD, S-OM, S-PDA)  . Diabetes mellitus   . GERD (gastroesophageal reflux disease)   . HLD (hyperlipidemia)   . Hypertension   . Ischemic cardiomyopathy    a. 2D ECHO 05/02/15 with EF 25-30%, mild LV dilation. Mild LVH, mod HKof the mid-apicalanteroseptal myocardium, G1DD // b. Mild LVH, EF 25, diffuse HK, grade 1 diastolic dysfunction, trivial MR // Echo 7/18: Mild concentric LVH, EF 25-30, inferior and inferolateral AK, grade 2 diastolic dysfunction, trivial MR/PI   . Tobacco abuse     Past Surgical History:  Procedure Laterality Date  . CARDIAC CATHETERIZATION N/A 05/03/2015   Procedure: Right/Left Heart Cath and Coronary Angiography;  Surgeon: Peter M SwazilandJordan, MD;  [facility]UPID;  Service: Cardiovascular;  Laterality: N/A;  . CARDIAC CATHETERIZATION N/A 05/03/2015   Procedure: Intravascular Ultrasound/IVUS;  Surgeon: Peter M Swaziland, MD;  Location: [facility] INVASIVE CV LAB CUPID;  Service: Cardiovascular;  Laterality: N/A;  . CARDIAC CATHETERIZATION N/A 05/03/2015   Procedure: Coronary Stent Intervention;  Surgeon: Peter M Swaziland, MD;  Location: [facility] INVASIVE CV LAB CUPID;   Service: Cardiovascular;  Laterality: N/A;  rca  . CORONARY ARTERY BYPASS GRAFT N/A 04/24/2017   Procedure: CORONARY ARTERY BYPASS GRAFTING (CABG) times three using left intenal mammary artery and right saphenous vein;  Surgeon: Kerin Perna, MD;  Location: [facility] OR;  Service: Open Heart Surgery;  Laterality: N/A;  . IABP INSERTION N/A 04/24/2017   Procedure: IABP Insertion;  Surgeon: Marykay Lex, MD;  Location: [facility] INVASIVE CV LAB;  Service: Cardiovascular;  Laterality: N/A;  . LEFT HEART CATH AND CORONARY ANGIOGRAPHY N/A 04/24/2017   Procedure: Left Heart Cath and Coronary Angiography;  Surgeon: Marykay Lex, MD;  Location: [facility] INVASIVE CV LAB;  Service: Cardiovascular;  Laterality: N/A;  . PERCUTANEOUS CORONARY STENT INTERVENTION (PCI-S)  05/03/2015   Procedure: Percutaneous Coronary Stent Intervention (Pci-S);  Surgeon: Peter M Swaziland, MD;  Location: [facility] INVASIVE CV LAB CUPID;  Service: Cardiovascular;;  Prox LAD  . TEE WITHOUT CARDIOVERSION N/A 04/24/2017   Procedure: TRANSESOPHAGEAL ECHOCARDIOGRAM (TEE);  Surgeon: Kerin Perna, MD;  Location: [facility] OR;  Service: Open Heart Surgery;  Laterality: N/A;  . WISDOM TOOTH EXTRACTION     bottom LT, top RT    Current Medications: Current Meds  Medication Sig  . amiodarone (PACERONE) 200 MG tablet Take 1 tablet (200 mg total) by mouth daily.  Marland Kitchen aspirin EC 81 MG tablet Take 81 mg by mouth daily.  Marland Kitchen atorvastatin (LIPITOR) 80 MG tablet Take 1 tablet (80 mg total) by mouth daily.  . carvedilol (COREG) 12.5 MG tablet TAKE 1 TABLET BY MOUTH TWICE DAILY WITH A MEAL  . cyclobenzaprine (FLEXERIL) 5 MG tablet Take 1 tablet (5 mg total) by mouth 3 (three) times daily as needed for muscle spasms.  . metFORMIN (GLUCOPHAGE) 1000 MG tablet TAKE ONE TABLET BY MOUTH TWICE DAILY  . [DISCONTINUED] furosemide (LASIX) 40 MG tablet Take 1 tablet (40 mg total) by mouth 2 (two) times daily.  . [DISCONTINUED] lisinopril (PRINIVIL,ZESTRIL) 10 MG tablet TAKE 1 TABLET BY  MOUTH DAILY, MUST KEEP APPOINTMENT FOR ADDITIONAL REFILLS     Allergies:   Aspirin and Other   Social History   Social History  . Marital status: Divorced    Spouse name: N/A  . Number of children: N/A  . Years of education: N/A   Social History Main Topics  . Smoking status: Current Some Day Smoker    Packs/day: 0.10    Types: Cigarettes    Last attempt to quit: 05/03/2015  . Smokeless tobacco: Never Used  . Alcohol use No     Comment: very occasionally  . Drug use: No     Comment: denies 06/28/14  . Sexual activity: Not Asked   Other Topics Concern  . None   Social History Narrative  . None     Family Hx: The patient's family history includes Breast cancer in his mother; Diabetes in his brother and sister; Heart disease in his father; Hypertension in his brother and sister.  ROS:   Please see the history of present illness.    ROS All other systems reviewed and are negative.   EKGs/Labs/Other Test Reviewed:  EKG:  EKG is  ordered today.  The ekg ordered today demonstrates sinus bradycardia, heart rate 59, normal axis, T wave inversions 1, 2, 3, aVF, V4-V6, QTC 409 ms, no change from prior tracing  Recent Labs: 06/10/2017: TSH 1.270 09/12/2017: ALT 20; B Natriuretic Peptide 1,385.3 09/14/2017: Magnesium 1.8 10/16/2017: BUN 25; Creatinine, Ser 1.73; Hemoglobin 13.1; Platelets 183; Potassium 3.9; Sodium 134   Recent Lipid Panel Lab Results  Component Value Date/Time   CHOL 138 06/10/2017 12:19 PM   TRIG 98 06/10/2017 12:19 PM   HDL 47 06/10/2017 12:19 PM   CHOLHDL 2.9 06/10/2017 12:19 PM   CHOLHDL 5.4 (H) 12/17/2016 11:20 AM   LDLCALC 71 06/10/2017 12:19 PM    Physical Exam:    VS:  BP 110/70   Pulse 63   Ht 6' (1.829 m)   Wt 188 lb 12.8 oz (85.6 kg)   SpO2 97%   BMI 25.61 kg/m     Wt Readings from Last 3 Encounters:  10/22/17 188 lb 12.8 oz (85.6 kg)  10/15/17 190 lb (86.2 kg)  09/14/17 186 lb 4.8 oz (84.5 kg)     Physical Exam    Constitutional: He is oriented to person, place, and time. He appears well-developed and well-nourished. No distress.  HENT:  Head: Normocephalic and atraumatic.  Neck: No JVD present.  Cardiovascular: Normal rate and regular rhythm.   No murmur heard. Pulmonary/Chest: Effort normal. He has no rales.  Abdominal: Soft.  Musculoskeletal: He exhibits no edema.  Neurological: He is alert and oriented to person, place, and time.  Skin: Skin is warm and dry.  Psychiatric: He has a normal mood and affect.    ASSESSMENT:    1. Chronic combined systolic and diastolic CHF (congestive heart failure) (HCC)   2. Coronary artery disease involving native coronary artery of native heart without angina pectoris   3. NSVT (nonsustained ventricular tachycardia) (HCC)   4. CKD (chronic kidney disease) stage 3, GFR 30-59 ml/min (HCC)    PLAN:    In order of problems listed above:  1. Chronic combined systolic and diastolic CHF (congestive heart failure) (HCC) EF 25.  Recent admission for decompensated heart failure.  He is NYHA class 2.  Volume appears stable.  I think he would benefit from the addition of Entresto.  -  Continue current dose of beta-blocker.  -  Discontinue lisinopril  -  Start Entresto 24/26 mg twice daily  -  Decrease Lasix to 40 mg QD.  -  BMET today, repeat 1 week  -  Consider resuming spironolactone depending upon renal function and potassium on follow-up labs  2. Coronary artery disease involving native coronary artery of native heart without angina pectoris History of stenting to the LAD and RCA in 2016 and subsequent CABG after non-ST elevation myocardial infarction in April 2018.  He is doing well without angina.  Continue aspirin, statin, beta-blocker.  If I have recommended he stop smoking.  3. NSVT (nonsustained ventricular tachycardia) (HCC) He has been evaluated by Dr. Johney Frame for consideration of ICD implantation.  He is not currently interested in pursuing ICD  implantation.  Continue amiodarone for now.  I will discuss this further with Dr. Tenny Craw.  4. CKD (chronic kidney disease) stage 3, GFR 30-59 ml/min (HCC) Repeat basic metabolic panel today and again in 1 week with adjustments of medications.   Dispo:  Return in about 3 months (around 01/22/2018) for Routine Follow Up, w/ Dr. Tenny Craw.   Medication Adjustments/Labs and Tests Ordered:  Current medicines are reviewed at length with the patient today.  Concerns regarding medicines are outlined above.  Tests Ordered: Orders Placed This Encounter  Procedures  . Basic Metabolic Panel (BMET)  . Basic Metabolic Panel (BMET)  . EKG 12-Lead   Medication Changes: Meds ordered this encounter  Medications  . sacubitril-valsartan (ENTRESTO) 24-26 MG    Sig: Take 1 tablet by mouth 2 (two) times daily.    Dispense:  180 tablet    Refill:  3    CHANGE IN THERAPY; D/C LISINOPRIL  . amiodarone (PACERONE) 200 MG tablet    Sig: Take 1 tablet (200 mg total) by mouth daily.    Dispense:  30 tablet    Refill:  11  . furosemide (LASIX) 40 MG tablet    Sig: Take 1 tablet (40 mg total) by mouth daily.    Dispense:  90 tablet    Refill:  3    Signed, Tereso Newcomer, PA-C  10/22/2017 3:00 PM    [facility] [address] [place], [place], [place]  [address] Phone: [phone]; Fax: [phone]

## 2017-10-22 NOTE — Progress Notes (Signed)
I would keep on amio for now

## 2017-10-23 ENCOUNTER — Telehealth: Payer: Self-pay

## 2017-10-23 LAB — BASIC METABOLIC PANEL
BUN/Creatinine Ratio: 12 (ref 9–20)
BUN: 16 mg/dL (ref 6–24)
CALCIUM: 9.4 mg/dL (ref 8.7–10.2)
CO2: 26 mmol/L (ref 20–29)
Chloride: 96 mmol/L (ref 96–106)
Creatinine, Ser: 1.38 mg/dL — ABNORMAL HIGH (ref 0.76–1.27)
GFR calc Af Amer: 67 mL/min/{1.73_m2} (ref >59)
GFR calc non Af Amer: 58 mL/min/{1.73_m2} — ABNORMAL LOW (ref >59)
Glucose: 243 mg/dL — ABNORMAL HIGH (ref 65–99)
POTASSIUM: 4.4 mmol/L (ref 3.5–5.2)
Sodium: 138 mmol/L (ref 134–144)

## 2017-10-23 NOTE — Telephone Encounter (Signed)
LM to go over Lab results

## 2017-10-23 NOTE — Telephone Encounter (Signed)
-----   Message from Scott T Weaver, PA-C sent at 10/23/2017  9:24 AM EDT ----- Please call the patient Renal function stable.  Potassium normal.  Glucose elevated.  Follow-up with PCP for diabetes. Continue current medications and follow up as planned.  Scott Weaver, PA-C 10/23/2017 9:24 AM 

## 2017-10-25 NOTE — Telephone Encounter (Signed)
-----   Message from Beatrice Lecher, [place] sent at 10/23/2017  9:24 AM EDT ----- Please call the patient Renal function stable.  Potassium normal.  Glucose elevated.  Follow-up with PCP for diabetes. Continue current medications and follow up as planned.  Tereso Newcomer, PA-C 10/23/2017 9:24 AM

## 2017-10-25 NOTE — Telephone Encounter (Signed)
Lmtcb x 2 to go over lab results 

## 2017-10-25 NOTE — Telephone Encounter (Signed)
-----   Message from Scott T Weaver, PA-C sent at 10/23/2017  9:24 AM EDT ----- Please call the patient Renal function stable.  Potassium normal.  Glucose elevated.  Follow-up with PCP for diabetes. Continue current medications and follow up as planned.  Scott Weaver, PA-C 10/23/2017 9:24 AM 

## 2017-10-25 NOTE — Telephone Encounter (Signed)
Ptcb and has been notified of lab results by phone with verbal understanding. Pt advised to f/u w/PCP in regards to elevated glucose levels. Pt is agreeable to plan of care. Pt thanked me for my call.

## 2017-10-31 ENCOUNTER — Other Ambulatory Visit: Payer: Medicaid Other

## 2017-11-04 ENCOUNTER — Other Ambulatory Visit: Payer: Medicaid Other | Admitting: *Deleted

## 2017-11-04 DIAGNOSIS — N183 Chronic kidney disease, stage 3 unspecified: Secondary | ICD-10-CM

## 2017-11-05 LAB — BASIC METABOLIC PANEL
BUN/Creatinine Ratio: 9 (ref 9–20)
BUN: 15 mg/dL (ref 6–24)
CALCIUM: 8.5 mg/dL — AB (ref 8.7–10.2)
CO2: 25 mmol/L (ref 20–29)
Chloride: 101 mmol/L (ref 96–106)
Creatinine, Ser: 1.69 mg/dL — ABNORMAL HIGH (ref 0.76–1.27)
GFR calc Af Amer: 52 mL/min/{1.73_m2} — ABNORMAL LOW (ref >59)
GFR calc non Af Amer: 45 mL/min/{1.73_m2} — ABNORMAL LOW (ref >59)
Glucose: 252 mg/dL — ABNORMAL HIGH (ref 65–99)
POTASSIUM: 4.1 mmol/L (ref 3.5–5.2)
SODIUM: 140 mmol/L (ref 134–144)

## 2017-11-13 ENCOUNTER — Telehealth: Payer: Self-pay | Admitting: *Deleted

## 2017-11-13 DIAGNOSIS — I5042 Chronic combined systolic (congestive) and diastolic (congestive) heart failure: Secondary | ICD-10-CM

## 2017-11-13 NOTE — Telephone Encounter (Signed)
-----   Message from Scott T Weaver, PA-C sent at 11/05/2017  4:59 PM EST ----- Please call the patient Renal function stable.  Continue current medications and follow up as planned.  Repeat BMET 2 weeks Scott Weaver, PA-C 11/05/2017 4:59 PM 

## 2017-11-13 NOTE — Telephone Encounter (Signed)
Ptcb and has been notified of lab results . Pt agreeable to repeat bmet to be done. Pt will do lab work 11/27/17. Pt advised to continue on his current Tx plan. Pt thanked me for my call.

## 2017-11-13 NOTE — Telephone Encounter (Signed)
-----   Message from Beatrice Lecher, [place] sent at 11/05/2017  4:59 PM EST ----- Please call the patient Renal function stable.  Continue current medications and follow up as planned.  Repeat BMET 2 weeks Tereso Newcomer, PA-C 11/05/2017 4:59 PM

## 2017-11-13 NOTE — Telephone Encounter (Signed)
Lmtcb to go over lab results and recommendations.  

## 2017-11-27 ENCOUNTER — Encounter (INDEPENDENT_AMBULATORY_CARE_PROVIDER_SITE_OTHER): Payer: Self-pay

## 2017-11-27 ENCOUNTER — Other Ambulatory Visit: Payer: Medicaid Other | Admitting: *Deleted

## 2017-11-27 DIAGNOSIS — I5042 Chronic combined systolic (congestive) and diastolic (congestive) heart failure: Secondary | ICD-10-CM

## 2017-11-28 ENCOUNTER — Telehealth: Payer: Self-pay | Admitting: *Deleted

## 2017-11-28 LAB — BASIC METABOLIC PANEL
BUN / CREAT RATIO: 10 (ref 9–20)
BUN: 14 mg/dL (ref 6–24)
CO2: 27 mmol/L (ref 20–29)
CREATININE: 1.37 mg/dL — AB (ref 0.76–1.27)
Calcium: 8.6 mg/dL — ABNORMAL LOW (ref 8.7–10.2)
Chloride: 101 mmol/L (ref 96–106)
GFR calc Af Amer: 68 mL/min/{1.73_m2} (ref >59)
GFR, EST NON AFRICAN AMERICAN: 58 mL/min/{1.73_m2} — AB (ref >59)
Glucose: 207 mg/dL — ABNORMAL HIGH (ref 65–99)
Potassium: 4.3 mmol/L (ref 3.5–5.2)
SODIUM: 140 mmol/L (ref 134–144)

## 2017-11-28 NOTE — Telephone Encounter (Signed)
-----   Message from Rosalio Macadamia, NP sent at 11/28/2017  7:41 AM EST ----- Reviewed for Tereso Newcomer, PA Ok to report. Labs are stable - blood sugar remains elevated - kidney function has improved Continue with current regimen.

## 2017-11-28 NOTE — Telephone Encounter (Signed)
Lmtcb to go over lab results 

## 2017-11-29 NOTE — Telephone Encounter (Signed)
-----   Message from Lori C Gerhardt, NP sent at 11/28/2017  7:41 AM EST ----- Reviewed for Scott Weaver, PA Ok to report. Labs are stable - blood sugar remains elevated - kidney function has improved Continue with current regimen. 

## 2017-11-29 NOTE — Telephone Encounter (Signed)
Left message x 2 to go over lab results.  

## 2017-11-30 ENCOUNTER — Encounter (HOSPITAL_COMMUNITY): Payer: Self-pay

## 2017-11-30 ENCOUNTER — Emergency Department (HOSPITAL_COMMUNITY): Payer: Medicaid Other

## 2017-11-30 ENCOUNTER — Emergency Department (HOSPITAL_COMMUNITY)
Admission: EM | Admit: 2017-11-30 | Discharge: 2017-11-30 | Disposition: A | Discharge status: Home / Self Care | Payer: Medicaid Other | Attending: Emergency Medicine | Admitting: Emergency Medicine

## 2017-11-30 ENCOUNTER — Other Ambulatory Visit: Payer: Self-pay

## 2017-11-30 DIAGNOSIS — N183 Chronic kidney disease, stage 3 (moderate): Secondary | ICD-10-CM | POA: Insufficient documentation

## 2017-11-30 DIAGNOSIS — Z7982 Long term (current) use of aspirin: Secondary | ICD-10-CM | POA: Insufficient documentation

## 2017-11-30 DIAGNOSIS — E785 Hyperlipidemia, unspecified: Secondary | ICD-10-CM | POA: Diagnosis not present

## 2017-11-30 DIAGNOSIS — I5042 Chronic combined systolic (congestive) and diastolic (congestive) heart failure: Secondary | ICD-10-CM | POA: Diagnosis not present

## 2017-11-30 DIAGNOSIS — R112 Nausea with vomiting, unspecified: Secondary | ICD-10-CM

## 2017-11-30 DIAGNOSIS — R911 Solitary pulmonary nodule: Secondary | ICD-10-CM | POA: Diagnosis not present

## 2017-11-30 DIAGNOSIS — I130 Hypertensive heart and chronic kidney disease with heart failure and stage 1 through stage 4 chronic kidney disease, or unspecified chronic kidney disease: Secondary | ICD-10-CM | POA: Insufficient documentation

## 2017-11-30 DIAGNOSIS — R1084 Generalized abdominal pain: Secondary | ICD-10-CM | POA: Insufficient documentation

## 2017-11-30 DIAGNOSIS — I252 Old myocardial infarction: Secondary | ICD-10-CM | POA: Insufficient documentation

## 2017-11-30 DIAGNOSIS — Z951 Presence of aortocoronary bypass graft: Secondary | ICD-10-CM | POA: Insufficient documentation

## 2017-11-30 DIAGNOSIS — Z7984 Long term (current) use of oral hypoglycemic drugs: Secondary | ICD-10-CM | POA: Insufficient documentation

## 2017-11-30 DIAGNOSIS — R197 Diarrhea, unspecified: Secondary | ICD-10-CM | POA: Insufficient documentation

## 2017-11-30 DIAGNOSIS — I255 Ischemic cardiomyopathy: Secondary | ICD-10-CM | POA: Insufficient documentation

## 2017-11-30 DIAGNOSIS — I2510 Atherosclerotic heart disease of native coronary artery without angina pectoris: Secondary | ICD-10-CM | POA: Diagnosis not present

## 2017-11-30 DIAGNOSIS — Z79899 Other long term (current) drug therapy: Secondary | ICD-10-CM | POA: Insufficient documentation

## 2017-11-30 DIAGNOSIS — E119 Type 2 diabetes mellitus without complications: Secondary | ICD-10-CM | POA: Diagnosis not present

## 2017-11-30 DIAGNOSIS — R1013 Epigastric pain: Secondary | ICD-10-CM | POA: Diagnosis present

## 2017-11-30 DIAGNOSIS — F17210 Nicotine dependence, cigarettes, uncomplicated: Secondary | ICD-10-CM | POA: Insufficient documentation

## 2017-11-30 HISTORY — DX: Heart failure, unspecified: I50.9

## 2017-11-30 LAB — COMPREHENSIVE METABOLIC PANEL
ALT: 15 U/L — AB (ref 17–63)
AST: 21 U/L (ref 15–41)
Albumin: 3.9 g/dL (ref 3.5–5.0)
Alkaline Phosphatase: 97 U/L (ref 38–126)
Anion gap: 11 (ref 5–15)
BUN: 8 mg/dL (ref 6–20)
CHLORIDE: 103 mmol/L (ref 101–111)
CO2: 22 mmol/L (ref 22–32)
CREATININE: 1.52 mg/dL — AB (ref 0.61–1.24)
Calcium: 9.1 mg/dL (ref 8.9–10.3)
GFR calc non Af Amer: 51 mL/min — ABNORMAL LOW (ref >60)
GFR, EST AFRICAN AMERICAN: 59 mL/min — AB (ref >60)
Glucose, Bld: 274 mg/dL — ABNORMAL HIGH (ref 65–99)
POTASSIUM: 3.8 mmol/L (ref 3.5–5.1)
SODIUM: 136 mmol/L (ref 135–145)
Total Bilirubin: 0.8 mg/dL (ref 0.3–1.2)
Total Protein: 7.4 g/dL (ref 6.5–8.1)

## 2017-11-30 LAB — CBC
HEMATOCRIT: 40.6 % (ref 39.0–52.0)
Hemoglobin: 14 g/dL (ref 13.0–17.0)
MCH: 32.7 pg (ref 26.0–34.0)
MCHC: 34.5 g/dL (ref 30.0–36.0)
MCV: 94.9 fL (ref 78.0–100.0)
PLATELETS: 193 10*3/uL (ref 150–400)
RBC: 4.28 MIL/uL (ref 4.22–5.81)
RDW: 13.7 % (ref 11.5–15.5)
WBC: 6.9 10*3/uL (ref 4.0–10.5)

## 2017-11-30 LAB — URINALYSIS, ROUTINE W REFLEX MICROSCOPIC
BACTERIA UA: NONE SEEN
BILIRUBIN URINE: NEGATIVE
Glucose, UA: >=500 mg/dL — AB
KETONES UR: 80 mg/dL — AB
LEUKOCYTES UA: NEGATIVE
Nitrite: NEGATIVE
SPECIFIC GRAVITY, URINE: 1.032 — AB (ref 1.005–1.030)
SQUAMOUS EPITHELIAL / LPF: NONE SEEN
pH: 5 (ref 5.0–8.0)

## 2017-11-30 LAB — LIPASE, BLOOD: LIPASE: 19 U/L (ref 11–51)

## 2017-11-30 MED ORDER — MORPHINE SULFATE (PF) 4 MG/ML IV SOLN
4.0000 mg | Freq: Once | INTRAVENOUS | Status: AC
  Administered 2017-11-30: 4 mg via INTRAVENOUS
  Filled 2017-11-30: qty 1

## 2017-11-30 MED ORDER — ONDANSETRON HCL 4 MG/2ML IJ SOLN
4.0000 mg | Freq: Once | INTRAMUSCULAR | Status: AC
  Administered 2017-11-30: 4 mg via INTRAVENOUS
  Filled 2017-11-30: qty 2

## 2017-11-30 MED ORDER — FENTANYL CITRATE (PF) 100 MCG/2ML IJ SOLN
50.0000 ug | Freq: Once | INTRAMUSCULAR | Status: AC
  Administered 2017-11-30: 50 ug via INTRAVENOUS
  Filled 2017-11-30: qty 2

## 2017-11-30 MED ORDER — LEVOFLOXACIN 750 MG PO TABS: 750.0000 mg | ORAL_TABLET | Freq: Every day | ORAL | 0 refills | Status: AC

## 2017-11-30 MED ORDER — METOCLOPRAMIDE HCL 10 MG PO TABS: 10.0000 mg | ORAL_TABLET | Freq: Four times a day (QID) | ORAL | 1 refills | Status: DC

## 2017-11-30 MED ORDER — SODIUM CHLORIDE 0.9 % IV BOLUS (SEPSIS)
1000.0000 mL | Freq: Once | INTRAVENOUS | Status: AC
  Administered 2017-11-30: 1000 mL via INTRAVENOUS

## 2017-11-30 MED ORDER — METOCLOPRAMIDE HCL 5 MG/ML IJ SOLN
10.0000 mg | Freq: Once | INTRAMUSCULAR | Status: AC
  Administered 2017-11-30: 10 mg via INTRAVENOUS
  Filled 2017-11-30: qty 2

## 2017-11-30 MED ORDER — IOPAMIDOL (ISOVUE-300) INJECTION 61%
INTRAVENOUS | Status: AC
  Filled 2017-11-30: qty 100

## 2017-11-30 MED ORDER — IOPAMIDOL (ISOVUE-300) INJECTION 61%
INTRAVENOUS | Status: AC
  Administered 2017-11-30: 100 mL
  Filled 2017-11-30: qty 100

## 2017-11-30 NOTE — ED Notes (Signed)
RN attempted to start IV x2 without success.  

## 2017-11-30 NOTE — ED Notes (Signed)
Patient transported to CT 

## 2017-11-30 NOTE — ED Notes (Signed)
Pt tolerated 2 cups of water w/o difficulty.  Denies nausea at this time.

## 2017-11-30 NOTE — ED Triage Notes (Addendum)
Pt states abdominal pain that started yesterday. He reports vomiting, diarrhea yesterday. Pt appears pale in color and diaphoretic. AOX4.

## 2017-11-30 NOTE — ED Provider Notes (Signed)
[facility] EMERGENCY DEPARTMENT Provider Note   CSN: [id number] Arrival date & time: 11/30/17  1142     History   Chief Complaint Chief Complaint  Patient presents with  . Abdominal Pain    HPI Rockie Schnoor is a 52 y.o. male w PMHx CAD, CKD, T2DM, HTN, presenting to ED with acute onset of epigastric abdominal pain that began yesterday. Pt states pain is constant, no medications tried. Associated nausea, vomiting, and diarrhea. States this feels similar to his gastroparesis that he has been recently diagnosed with. Denies blood in stool, melena, F/C, CP, urinary sx, or any other complaints. No hx of abdominal surgeries. No hx gallstones.  States he has not taken his BP medications 2/t vomiting.  The history is provided by the patient.    Past Medical History:  Diagnosis Date  . CAD (coronary artery disease) 2016   a. NSTEMI 5/16 s/p DES to pLAD and dRCA // b. NSTEMI 4/18 >> 95% LM, oLAD stent ok, oLCx 80, mRCA stent ok, EF < 25 >> IABP // c. s/p CABG with Dr. Zenaida Niece Trig (L-LAD, S-OM, S-PDA)  . CHF (congestive heart failure) (HCC)   . Diabetes mellitus   . GERD (gastroesophageal reflux disease)   . HLD (hyperlipidemia)   . Hypertension   . Ischemic cardiomyopathy    a. 2D ECHO 05/02/15 with EF 25-30%, mild LV dilation. Mild LVH, mod HKof the mid-apicalanteroseptal myocardium, G1DD // b. Mild LVH, EF 25, diffuse HK, grade 1 diastolic dysfunction, trivial MR // Echo 7/18: Mild concentric LVH, EF 25-30, inferior and inferolateral AK, grade 2 diastolic dysfunction, trivial MR/PI   . Tobacco abuse     Patient Active Problem List   Diagnosis Date Noted  . NSVT (nonsustained ventricular tachycardia) (HCC) 05/17/2017  . 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 Left main coronary artery thrombosis (HCC)   . CKD (chronic kidney disease) stage 3, GFR 30-59 ml/min (HCC) 04/23/2017  . Cardiomyopathy, ischemic 09/09/2016  . Non compliance w medication regimen 09/09/2016  . NSTEMI  (non-ST elevated myocardial infarction) (HCC) 05/05/2015  . GERD (gastroesophageal reflux disease)   . HLD (hyperlipidemia)   . CAD (coronary artery disease)   . Chronic combined systolic and diastolic CHF (congestive heart failure) (HCC) 05/03/2015  . Essential hypertension 05/03/2015  . TOBACCO ABUSE 01/19/2009  . Type 2 diabetes mellitus with circulatory disorder (HCC) 10/31/2008    Past Surgical History:  Procedure Laterality Date  . CARDIAC CATHETERIZATION N/A 05/03/2015   Procedure: Right/Left Heart Cath and Coronary Angiography;  Surgeon: Peter M [place], MD;  Location: [facility] INVASIVE CV LAB CUPID;  Service: Cardiovascular;  Laterality: N/A;  . CARDIAC CATHETERIZATION N/A 05/03/2015   Procedure: Intravascular Ultrasound/IVUS;  Surgeon: Peter M [place], MD;  Location: [facility] INVASIVE CV LAB [facility]UP[facility];  Service: Cardiovascular;  Laterality: N/A;  . CARDIAC CATHETERIZATION N/A 05/03/2015   Procedure: Coronary Stent Intervention;  Surgeon: Peter M [place]az[place], MD;  Location: [facility] INVASIVE CV LAB [facility]UP[facility];  Service: Cardiovascular;  Laterality: N/A;  rca  . CORONARY ARTERY BYPASS GRAFT N/A 04/24/2017   Procedure: CORONARY ARTERY BYPASS GRAFTING (CABG) times three using left intenal mammary artery and right saphenous vein;  Surgeon: Kerin Perna, MD;  Location: [facility] OR;  Service: Open Heart Surgery;  Laterality: N/A;  . IABP INSERTION N/A 04/24/2017   Procedure: IABP Insertion;  Surgeon: Marykay Lex, MD;  Location: [facility] INVASIVE CV LAB;  Service: Cardiovascular;  Laterality: N/A;  . LEFT HEART CATH AND CORONARY ANGIOGRAPHY N/A  04/24/2017   Procedure: Left Heart Cath and Coronary Angiography;  Surgeon: Marykay Lexavid W Harding, MD;  [facility];  Service: Cardiovascular;  Laterality: N/A;  . PERCUTANEOUS CORONARY STENT INTERVENTION (PCI-S)  05/03/2015   Procedure: Percutaneous Coronary Stent Intervention (Pci-S);  Surgeon: Peter M SwazilandJordan, MD;  [facility];  Service: Cardiovascular;;  Prox  LAD  . TEE WITHOUT CARDIOVERSION N/A 04/24/2017   Procedure: TRANSESOPHAGEAL ECHOCARDIOGRAM (TEE);  Surgeon: Kerin PernaPeter Van Trigt, MD;  [facility];  Service: Open Heart Surgery;  Laterality: N/A;  . WISDOM TOOTH EXTRACTION     bottom LT, top RT       Home Medications    Prior to Admission medications   Medication Sig Start Date End Date Taking? Authorizing Provider  amiodarone (PACERONE) 200 MG tablet Take 1 tablet (200 mg total) by mouth daily. 10/22/17  Yes Tereso NewcomerWeaver, Scott T, PA-C  aspirin EC 81 MG tablet Take 81 mg by mouth daily.   Yes [provider]  atorvastatin (LIPITOR) 80 MG tablet Take 1 tablet (80 mg total) by mouth daily. 05/17/17 11/30/17 Yes Weaver, Scott T, PA-C  clopidogrel (PLAVIX) 75 MG tablet Take 75 mg by mouth daily.   Yes [provider]  furosemide (LASIX) 40 MG tablet Take 1 tablet (40 mg total) by mouth daily. 10/22/17 01/20/18 Yes Weaver, Scott T, PA-C  metFORMIN (GLUCOPHAGE) 1000 MG tablet Take 1,000 mg by mouth two times a day 07/30/17  Yes [provider]  sacubitril-valsartan (ENTRESTO) 24-26 MG Take 1 tablet by mouth 2 (two) times daily. 10/22/17  Yes Weaver, Scott T, PA-C  carvedilol (COREG) 12.5 MG tablet TAKE 1 TABLET BY MOUTH TWICE DAILY WITH A MEAL Patient not taking: Reported on 11/30/2017 07/22/17   Janetta Horahompson, Kathryn R, PA-C  cyclobenzaprine (FLEXERIL) 5 MG tablet Take 1 tablet (5 mg total) by mouth 3 (three) times daily as needed for muscle spasms. Patient not taking: Reported on 11/30/2017 10/16/17   Ward, Layla MawKristen N, DO    Family History Family History  Problem Relation Age of Onset  . Breast cancer Mother   . Heart disease Father   . Diabetes Sister   . Hypertension Sister   . Hypertension Brother   . Diabetes Brother     Social History Social History   Tobacco Use  . Smoking status: Current Some Day Smoker    Packs/day: 0.10    Types: Cigarettes    Last attempt to quit: 05/03/2015    Years since quitting: 2.5  .  Smokeless tobacco: Never Used  Substance Use Topics  . Alcohol use: No    Alcohol/week: 0.0 oz    Comment: very occasionally  . Drug use: No    Comment: denies 06/28/14     Allergies   Other   Review of Systems Review of Systems  Constitutional: Negative for chills and fever.  Cardiovascular: Negative for chest pain.  Gastrointestinal: Positive for abdominal pain, diarrhea, nausea and vomiting. Negative for blood in stool.  Genitourinary: Negative for dysuria and frequency.  All other systems reviewed and are negative.    Physical Exam Updated Vital Signs BP (!) 173/84 (BP Location: Right Arm)   Pulse 77   Temp 98.6 F (37 C) (Oral)   Resp 16   SpO2 96%   Physical Exam  Constitutional: He appears well-developed and well-nourished.  HENT:  Head: Normocephalic and atraumatic.  Eyes: Conjunctivae are normal.  Pulmonary/Chest: Effort normal.  Abdominal: Soft. Normal appearance and bowel  sounds are normal. He exhibits no distension and no mass. There is generalized tenderness. There is no rigidity and no rebound.  Neurological: He is alert.  Skin: Skin is warm.  Psychiatric: He has a normal mood and affect. His behavior is normal.  Nursing note and vitals reviewed.    ED Treatments / Results  Labs (all labs ordered are listed, but only abnormal results are displayed) Labs Reviewed  COMPREHENSIVE METABOLIC PANEL - Abnormal; Notable for the following components:      Result Value   Glucose, Bld 274 (*)    Creatinine, Ser 1.52 (*)    ALT 15 (*)    GFR calc non Af Amer 51 (*)    GFR calc Af Amer 59 (*)    All other components within normal limits  URINALYSIS, ROUTINE W REFLEX MICROSCOPIC - Abnormal; Notable for the following components:   Specific Gravity, Urine 1.032 (*)    Glucose, UA >=500 (*)    Hgb urine dipstick MODERATE (*)    Ketones, ur 80 (*)    Protein, ur >=300 (*)    All other components within normal limits  LIPASE, BLOOD  CBC    EKG  EKG  Interpretation None       Radiology Ct Abdomen Pelvis W Contrast  Result Date: 11/30/2017 CLINICAL DATA:  Generalized abdominal pain since yesterday. EXAM: CT ABDOMEN AND PELVIS WITH CONTRAST TECHNIQUE: Multidetector CT imaging of the abdomen and pelvis was performed using the standard protocol following bolus administration of intravenous contrast. CONTRAST:  ISOVUE-300 IOPAMIDOL (ISOVUE-300) INJECTION 61% COMPARISON:  01/12/2017 FINDINGS: Lower chest: Left base bullous/cystic change is similar to prior study with an area of probable atelectasis posterior left lung base on the current study. Patchy and in some areas confluent/ nodular opacity in the lung bases is new in the interval (9 mm left lower lobe nodule seen on image 11 series 4). Hepatobiliary: Small area of low attenuation in the anterior liver, adjacent to the falciform ligament, is in a characteristic location for focal fatty deposition. There is no evidence for gallstones, gallbladder wall thickening, or pericholecystic fluid. No intrahepatic or extrahepatic biliary dilation. Pancreas: No focal mass lesion. No dilatation of the main duct. No intraparenchymal cyst. No peripancreatic edema. Spleen: No splenomegaly. No focal mass lesion. Adrenals/Urinary Tract: No adrenal nodule or mass. 2 mm nonobstructing stone identified upper pole left kidney. Right kidney unremarkable. No evidence for hydroureter. The urinary bladder appears normal for the degree of distention. Stomach/Bowel: Stomach is nondistended. No gastric wall thickening. No evidence of outlet obstruction. Duodenum is normally positioned as is the ligament of Treitz. No small bowel wall thickening. No small bowel dilatation. The terminal ileum is normal. The appendix is normal. No gross colonic mass. No colonic wall thickening. No substantial diverticular change. Vascular/Lymphatic: There is abdominal aortic atherosclerosis without aneurysm. There is no gastrohepatic or  hepatoduodenal ligament lymphadenopathy. No intraperitoneal or retroperitoneal lymphadenopathy. No pelvic sidewall lymphadenopathy. Reproductive: The prostate gland and seminal vesicles have normal imaging features. Other: No intraperitoneal free fluid. Musculoskeletal: Bone windows reveal no worrisome lytic or sclerotic osseous lesions. IMPRESSION: 1. No acute findings in the abdomen or pelvis. 2. Areas of confluent airspace consolidation and nodularity at the left base. This may be infectious/inflammatory in etiology although associated atelectasis suspected. Dedicated CT chest to more definitively characterize may prove helpful. Given the 9 mm nodular density at the left base follow-up imaging is recommended. Non-contrast chest CT at 3-6 months is recommended. If the nodules are  stable at time of repeat CT, then future CT at 18-24 months (from today's scan) is considered optional for low-risk patients, but is recommended for high-risk patients. This recommendation follows the consensus statement: Guidelines for Management of Incidental Pulmonary Nodules Detected on CT Images: From the Fleischner Society 2017; Radiology 2017; 284:228-243. 3. Nonobstructing 2 mm left upper pole renal stone. Electronically Signed   By: Kennith Center M.D.   On: 11/30/2017 17:21    Procedures Procedures (including critical care time)  Medications Ordered in ED Medications  iopamidol (ISOVUE-300) 61 % injection (not administered)  ondansetron (ZOFRAN) injection 4 mg (4 mg Intravenous Incomplete 11/30/17 1729)  morphine 4 MG/ML injection 4 mg (4 mg Intravenous Incomplete 11/30/17 1729)  metoCLOPramide (REGLAN) injection 10 mg (10 mg Intravenous Given 11/30/17 1606)  sodium chloride 0.9 % bolus 1,000 mL (1,000 mLs Intravenous New Bag/Given 11/30/17 1604)  fentaNYL (SUBLIMAZE) injection 50 mcg (50 mcg Intravenous Given 11/30/17 1606)  iopamidol (ISOVUE-300) 61 % injection (100 mLs  Contrast Given 11/30/17 1632)     Initial  Impression / Assessment and Plan / ED Course  I have reviewed the triage vital signs and the nursing notes.  Pertinent labs & imaging results that were available during my care of the patient were reviewed by me and considered in my medical decision making (see chart for details).  Clinical Course as of Dec 01 1919  Sat Nov 30, 2017  1752 Discussed results with Dr. Ranae Palms. Plan to re-evaluate and PO challenge. CT Abdomen Pelvis W Contrast [JR]    Clinical Course User Index [JR] Ayriel Texidor, [place] N, PA-C   Pt w generalized abdominal pain, N/V/D, likely 2/t gastroparesis. CT abd neg for acute intraabdominal pathology, however with incidental pulmonary findings. Pt questioned about pulmonary sx and reports vague cough and SOB, however not significantly changed from baseline. Patient is nontoxic, nonseptic appearing, in no apparent distress. Patient's pain and other symptoms adequately managed in emergency department. Fluid bolus given. Labs, imaging and vitals reviewed. Patient does not meet the SIRS or Sepsis criteria. On repeat exam patient does not have a surgical abdomen and there are no peritoneal signs. Pt tolerating PO fluids >6oz. Patient discharged home with symptomatic treatment for gastroparesis, and Levaquin to cover possible PNA. Pt's CT findings discussed with recommendation to follow up with PCP for recheck of lung nodule. Pt safe for discharge.  Patient discussed with and seen by Dr. Ranae Palms.  Discussed results, findings, treatment and follow up. Patient advised of return precautions. Patient verbalized understanding and agreed with plan.  Final Clinical Impressions(s) / ED Diagnoses   Final diagnoses:  Nausea vomiting and diarrhea  Generalized abdominal pain  Left lower lobe pulmonary nodule    ED Discharge Orders    None       Francheska Villeda, [place] N, PA-C 11/30/17 1925    Loren Racer, MD 12/10/17 1223

## 2017-11-30 NOTE — Discharge Instructions (Addendum)
Please read instructions below. Begin taking the antibiotic, Levaquin, once daily until it is gone. You can take the reglan/metoclopramide, every 6 hours as needed for abdominal pain and nausea. Follow up with your primary care on Monday regarding your visit today. It is also important to discuss the CT findings of the nodule in your left lung. Return to the ER for fever, or new or concerning symptoms.

## 2017-12-13 ENCOUNTER — Telehealth: Payer: Self-pay

## 2017-12-13 NOTE — Telephone Encounter (Signed)
Prior auth initiated for [employer] 24-26 through [employer]. Needs to go to pharmacy review. I will call back on Monday to get approval. PA # [id number].

## 2017-12-16 ENCOUNTER — Telehealth: Payer: Self-pay

## 2017-12-16 NOTE — Telephone Encounter (Signed)
Entresto approved per [employer]. PA # [id number]. And is good through 12/08/2018. Pharmacy notified.

## 2018-01-23 ENCOUNTER — Ambulatory Visit: Payer: Medicaid Other | Admitting: Internal Medicine
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
## 2018-10-24 ENCOUNTER — Other Ambulatory Visit: Payer: Self-pay | Admitting: Physician Assistant

## 2018-12-17 ENCOUNTER — Other Ambulatory Visit: Payer: Self-pay | Admitting: Physician Assistant

## 2018-12-25 ENCOUNTER — Telehealth: Payer: Self-pay

## 2018-12-25 NOTE — Telephone Encounter (Signed)
Received notification that Pt needs PA done for [facility]. Pt was last seen by Wende Mott 09/2017 and was supposed to f/u with Dr Tenny Craw in 12/2017 but did not f/u with that appt.   I spoke with management and they advised me to contact the pt and schedule an appt before I can continue with this PA.   I called Pt and left a VM stating that he needs an appt before he can receive refills or any medication assistance.

## 2019-01-12 ENCOUNTER — Other Ambulatory Visit: Payer: Self-pay | Admitting: Internal Medicine

## 2019-01-12 MED ORDER — SACUBITRIL-VALSARTAN 24-26 MG PO TABS: 1.0000 | ORAL_TABLET | Freq: Two times a day (BID) | ORAL | 0 refills | Status: DC

## 2019-01-12 NOTE — Telephone Encounter (Signed)
Pt's medication was sent to pt's pharmacy as requested. Confirmation received.  °

## 2019-01-27 ENCOUNTER — Encounter: Payer: Self-pay | Admitting: Physician Assistant

## 2019-01-28 ENCOUNTER — Ambulatory Visit: Payer: Medicaid Other | Admitting: Physician Assistant

## 2019-02-11 ENCOUNTER — Ambulatory Visit: Payer: Medicaid Other | Admitting: Physician Assistant

## 2019-02-11 NOTE — Progress Notes (Deleted)
Cardiology Office Note:    Date:  02/11/2019   ID:  Alan Diaz, DOB 1965/03/30, MRN [id number]  PCP:  Patient, No Pcp Per  Cardiologist:  No primary care provider on file. *** Electrophysiologist:  None   Referring MD: No ref. provider found   No chief complaint on file. ***  History of Present Illness:    Alan Diaz is a 54 y.o. male with coronary artery disease (s/p DES to pLAD and dRCA in 05/2015), ischemic cardiomyopathy, chronic combined systolic and diastolic CHF (EF previously 25-30%, improved to 35-40% by echo in 08/2016), diabetes, hypertension, hyperlipidemia, CKD and tobacco use.  He was admitted 4/18 with a non-STEMI and acute on chronic combined systolic and diastolic heart failure.  EF was 25% on echocardiogram.  He had severe left main stenosis on cardiac catheterization and underwent coronary artery bypass grafting with LIMA-LAD, SVG-OM, SVG-PDA.  He did have postoperative nonsustained ventricular tachycardia.  He was placed on amiodarone.  Follow-up echocardiogram in 7/18 continued to demonstrate severe LV dysfunction.  He was seen by Dr. Johney Diaz for EP consultation in 8/18.  ICD implantation was recommended.  The patient was not ready to proceed at that time.  He was last seen in October 2018 after admission for decompensated heart failure.  I placed him on Entresto at that time.  He has not been seen since.  Alan Diaz ***  Prior CV studies:   The following studies were reviewed today:  *** Echocardiogram 09/13/17 Mild LVH, EF 25-30, diffuse HK, grade 2 diastolic dysfunction, trivial MR, mild TR, PASP 20  Echocardiogram 07/25/17 Mild concentric LVH, EF 25-30, severe diffuse HK, inferior and inferolateral akinesis, grade 2 diastolic dysfunction, trivial MR  Intraoperative TEE 04/24/17 1+ MR, 1+ TR, severe LV dysfunction  LHC 04/24/17 LM 95 LAD ostial stent patent, mid 40 LCx ostial 80 RCA proximal 40, mid stent patent EF <25 IABP placed  Echo  04/24/17 Mild LVH, EF 25, diffuse HK, grade 1 diastolic dysfunction, trivial MR  Echo 09/10/16 EF 35-40, diffuse HK, grade 2 diastolic dysfunction  Past Medical History:  Diagnosis Date  . CAD (coronary artery disease) 2016   a. NSTEMI 5/16 s/p DES to pLAD and dRCA // b. NSTEMI 4/18 >> 95% LM, oLAD stent ok, oLCx 80, mRCA stent ok, EF < 25 >> IABP // c. s/p CABG with Dr. Zenaida Niece Trig (L-LAD, S-OM, S-PDA)  . CHF (congestive heart failure) (HCC)   . Diabetes mellitus   . GERD (gastroesophageal reflux disease)   . HLD (hyperlipidemia)   . Hypertension   . Ischemic cardiomyopathy    a. 2D ECHO 05/02/15 with EF 25-30%, mild LV dilation. Mild LVH, mod HKof the mid-apicalanteroseptal myocardium, G1DD // b. Mild LVH, EF 25, diffuse HK, grade 1 diastolic dysfunction, trivial MR // Echo 7/18: Mild concentric LVH, EF 25-30, inferior and inferolateral AK, grade 2 diastolic dysfunction, trivial MR/PI   . Tobacco abuse    Surgical Hx: The patient  has a past surgical history that includes Wisdom tooth extraction; Cardiac catheterization (N/A, 05/03/2015); percutaneous coronary stent intervention (pci-s) (05/03/2015); Cardiac catheterization (N/A, 05/03/2015); Cardiac catheterization (N/A, 05/03/2015); LEFT HEART CATH AND CORONARY ANGIOGRAPHY (N/A, 04/24/2017); IABP Insertion (N/A, 04/24/2017); Coronary artery bypass graft (N/A, 04/24/2017); and TEE without cardioversion (N/A, 04/24/2017).   Current Medications: No outpatient medications have been marked as taking for the 02/11/19 encounter (Appointment) with Alan Diaz T, PA-C.     Allergies:   Other   Social History   Tobacco Use  .  Smoking status: Current Some Day Smoker    Packs/day: 0.10    Types: Cigarettes    Last attempt to quit: 05/03/2015    Years since quitting: 3.7  . Smokeless tobacco: Never Used  Substance Use Topics  . Alcohol use: No    Alcohol/week: 0.0 standard drinks    Comment: very occasionally  . Drug use: No    Types: Marijuana     Comment: denies 06/28/14     Family Hx: The patient's family history includes Breast cancer in his mother; Diabetes in his brother and sister; Heart disease in his father; Hypertension in his brother and sister.  ROS:   Please see the history of present illness.    ROS All other systems reviewed and are negative.   EKGs/Labs/Other Test Reviewed:    EKG:  EKG is *** ordered today.  The ekg ordered today demonstrates ***  Recent Labs: No results found for requested labs within last 8760 hours.   Recent Lipid Panel Lab Results  Component Value Date/Time   CHOL 138 06/10/2017 12:19 PM   TRIG 98 06/10/2017 12:19 PM   HDL 47 06/10/2017 12:19 PM   CHOLHDL 2.9 06/10/2017 12:19 PM   CHOLHDL 5.4 (H) 12/17/2016 11:20 AM   LDLCALC 71 06/10/2017 12:19 PM    Physical Exam:    VS:  There were no vitals taken for this visit.    Wt Readings from Last 3 Encounters:  10/22/17 188 lb 12.8 oz (85.6 kg)  10/15/17 190 lb (86.2 kg)  09/14/17 186 lb 4.8 oz (84.5 kg)     ***Physical Exam  ASSESSMENT & PLAN:    No diagnosis found.*** 1. Chronic combined systolic and diastolic CHF (congestive heart failure) (HCC) EF 25.  Recent admission for decompensated heart failure.  He is NYHA class 2.  Volume appears stable.  I think he would benefit from the addition of Entresto.             -  Continue current dose of beta-blocker.             -  Discontinue lisinopril             -  Start Entresto 24/26 mg twice daily             -  Decrease Lasix to 40 mg QD.             -  BMET today, repeat 1 week             -  Consider resuming spironolactone depending upon renal function and potassium on follow-up labs  2. Coronary artery disease involving native coronary artery of native heart without angina pectoris History of stenting to the LAD and RCA in 2016 and subsequent CABG after non-ST elevation myocardial infarction in April 2018.  He is doing well without angina.  Continue aspirin, statin,  beta-blocker.  If I have recommended he stop smoking.  3. NSVT (nonsustained ventricular tachycardia) (HCC) He has been evaluated by Dr. Johney Diaz for consideration of ICD implantation.  He is not currently interested in pursuing ICD implantation.  Continue amiodarone for now.  I will discuss this further with Dr. Tenny Craw.  4. CKD (chronic kidney disease) stage 3, GFR 30-59 ml/min (HCC) Repeat basic metabolic panel today and again in 1 week with adjustments of medications.   Dispo:  No follow-ups on file.   Medication Adjustments/Labs and Tests Ordered: Current medicines are reviewed at length with the patient today.  Concerns regarding  medicines are outlined above.  Tests Ordered: No orders of the defined types were placed in this encounter.  Medication Changes: No orders of the defined types were placed in this encounter.   Signed, Alan Newcomer, PA-C  02/11/2019 10:17 AM    [facility] [address] [place], [place], [place]  [address] Phone: [phone]; Fax: [phone]

## 2019-02-13 ENCOUNTER — Encounter: Payer: Self-pay | Admitting: Physician Assistant

## 2019-02-21 ENCOUNTER — Other Ambulatory Visit: Payer: Self-pay | Admitting: Physician Assistant

## 2019-03-16 ENCOUNTER — Ambulatory Visit (INDEPENDENT_AMBULATORY_CARE_PROVIDER_SITE_OTHER): Payer: Medicaid Other | Admitting: Physician Assistant

## 2019-03-16 ENCOUNTER — Other Ambulatory Visit: Payer: Self-pay

## 2019-03-16 ENCOUNTER — Encounter: Payer: Self-pay | Admitting: Physician Assistant

## 2019-03-16 ENCOUNTER — Telehealth: Payer: Self-pay

## 2019-03-16 ENCOUNTER — Ambulatory Visit: Payer: Medicaid Other | Admitting: Physician Assistant

## 2019-03-16 VITALS — BP 130/100 | HR 88 | Ht 72.0 in | Wt 191.1 lb

## 2019-03-16 DIAGNOSIS — I5043 Acute on chronic combined systolic (congestive) and diastolic (congestive) heart failure: Secondary | ICD-10-CM | POA: Diagnosis not present

## 2019-03-16 DIAGNOSIS — I2510 Atherosclerotic heart disease of native coronary artery without angina pectoris: Secondary | ICD-10-CM

## 2019-03-16 DIAGNOSIS — I472 Ventricular tachycardia: Secondary | ICD-10-CM

## 2019-03-16 DIAGNOSIS — N183 Chronic kidney disease, stage 3 unspecified: Secondary | ICD-10-CM

## 2019-03-16 DIAGNOSIS — I110 Hypertensive heart disease with heart failure: Secondary | ICD-10-CM | POA: Diagnosis not present

## 2019-03-16 DIAGNOSIS — I4729 Other ventricular tachycardia: Secondary | ICD-10-CM

## 2019-03-16 MED ORDER — SACUBITRIL-VALSARTAN 24-26 MG PO TABS: 1.0000 | ORAL_TABLET | Freq: Two times a day (BID) | ORAL | 11 refills | Status: AC

## 2019-03-16 MED ORDER — CARVEDILOL 12.5 MG PO TABS: ORAL_TABLET | ORAL | 3 refills | Status: DC

## 2019-03-16 MED ORDER — FUROSEMIDE 40 MG PO TABS: 40.0000 mg | ORAL_TABLET | Freq: Every day | ORAL | 11 refills | Status: DC

## 2019-03-16 NOTE — Progress Notes (Addendum)
Cardiology Office Note:    Date:  03/16/2019   ID:  Alan Diaz, DOB 1965-05-09, MRN [id number]  PCP:  Patient, No Pcp Per  Cardiologist:  Dietrich Pates, MD   Electrophysiologist:  Alan Range, MD   Referring MD: No ref. provider found   Chief Complaint  Patient presents with  . Follow-up    CHF     History of Present Illness:    Alan Diaz is a 54 y.o. male with coronary artery disease, ischemic cardiomyopathy, chronic combined systolic and diastolic CHF, diabetes, hypertension, hyperlipidemia and tobacco use.  He has a hx of DES to pLAD and dRCA in 05/2015.  He then suffered a NSTEMI in 4/18 followed by coronary artery bypass grafting with LIMA-LAD, SVG-OM, SVG-PDA.  He did have postoperative nonsustained ventricular tachycardia and was treated with amiodarone.  Follow-up echocardiogram continued to demonstrate severe LV dysfunction (EF 25-30) and he was seen by Dr. Johney Diaz for EP consultation.  ICD implantation was recommended but the patient declined.  He was last seen in 09/2017 after a recent admission for decompensated HF.  I placed him on Entresto at his last visit.    Alan Diaz returns for follow up.  He ran out of Lasix and [name] a couple of weeks ago.  He was feeling very good on Entresto.  Since he ran out of both medications, he has felt more short of breath and notes orthopnea.  He has not had chest pain, leg swelling or syncope.  He is pretty sure he is still taking Coreg, Atorvastatin, ASA, Amiodarone.    Prior CV studies:   The following studies were reviewed today:  Echocardiogram 09/13/17 Mild LVH, EF 25-30, diffuse HK, grade 2 diastolic dysfunction, trivial MR, mild TR, PASP 20  Echocardiogram 07/25/17 Mild concentric LVH, EF 25-30, severe diffuse HK, inferior and inferolateral akinesis, grade 2 diastolic dysfunction, trivial MR  Intraoperative TEE 04/24/17 1+ MR, 1+ TR, severe LV dysfunction  LHC 04/24/17 LM 95 LAD ostial stent patent, mid 40 LCx  ostial 80 RCA proximal 40, mid stent patent EF <25 IABP placed  Echo 04/24/17 Mild LVH, EF 25, diffuse HK, grade 1 diastolic dysfunction, trivial MR  Echo 09/10/16 EF 35-40, diffuse HK, grade 2 diastolic dysfunction  Past Medical History:  Diagnosis Date  . CAD (coronary artery disease) 2016   a. NSTEMI 5/16 s/p DES to pLAD and dRCA // b. NSTEMI 4/18 >> 95% LM, oLAD stent ok, oLCx 80, mRCA stent ok, EF < 25 >> IABP // c. s/p CABG with Dr. Zenaida Niece Trig (L-LAD, S-OM, S-PDA)  . CHF (congestive heart failure) (HCC)   . Diabetes mellitus   . GERD (gastroesophageal reflux disease)   . HLD (hyperlipidemia)   . Hypertension   . Ischemic cardiomyopathy    a. 2D ECHO 05/02/15 with EF 25-30%, mild LV dilation. Mild LVH, mod HKof the mid-apicalanteroseptal myocardium, G1DD // b. Mild LVH, EF 25, diffuse HK, grade 1 diastolic dysfunction, trivial MR // Echo 7/18: Mild concentric LVH, EF 25-30, inferior and inferolateral AK, grade 2 diastolic dysfunction, trivial MR/PI   . Tobacco abuse    Surgical Hx: The patient  has a past surgical history that includes Wisdom tooth extraction; Cardiac catheterization (N/A, 05/03/2015); percutaneous coronary stent intervention (pci-s) (05/03/2015); Cardiac catheterization (N/A, 05/03/2015); Cardiac catheterization (N/A, 05/03/2015); LEFT HEART CATH AND CORONARY ANGIOGRAPHY (N/A, 04/24/2017); IABP Insertion (N/A, 04/24/2017); Coronary artery bypass graft (N/A, 04/24/2017); and TEE without cardioversion (N/A, 04/24/2017).   Current Medications: Current Meds  Medication Sig  .  amiodarone (PACERONE) 200 MG tablet Take 1 tablet (200 mg total) by mouth daily.  Marland Kitchen aspirin EC 81 MG tablet Take 81 mg by mouth daily.  Marland Kitchen atorvastatin (LIPITOR) 80 MG tablet Take 1 tablet (80 mg total) by mouth daily.  . carvedilol (COREG) 12.5 MG tablet Take 1 tablet by mouth twice daily with a meal. Patient overdue for an appointment and must call and schedule for further refills1st attempt  . clopidogrel  (PLAVIX) 75 MG tablet Take 75 mg by mouth daily.  . metFORMIN (GLUCOPHAGE) 1000 MG tablet Take 1,000 mg by mouth two times a day  . metoCLOPramide (REGLAN) 10 MG tablet Take 1 tablet (10 mg total) by mouth every 6 (six) hours.  . [DISCONTINUED] furosemide (LASIX) 40 MG tablet Take 1 tablet (40 mg total) by mouth daily.  . [DISCONTINUED] sacubitril-valsartan (ENTRESTO) 24-26 MG Take 1 tablet by mouth 2 (two) times daily. Please keep upcoming appt in January for future refills. Thank you     Allergies:   Other   Social History   Tobacco Use  . Smoking status: Current Some Day Smoker    Packs/day: 0.10    Types: Cigarettes    Last attempt to quit: 05/03/2015    Years since quitting: 3.8  . Smokeless tobacco: Never Used  Substance Use Topics  . Alcohol use: No    Alcohol/week: 0.0 standard drinks    Comment: very occasionally  . Drug use: No    Types: Marijuana    Comment: denies 06/28/14     Family Hx: The patient's family history includes Breast cancer in his mother; Diabetes in his brother and sister; Heart disease in his father; Hypertension in his brother and sister.  ROS:   Please see the history of present illness.    ROS All other systems reviewed and are negative.   EKGs/Labs/Other Test Reviewed:    EKG:  EKG is   ordered today.  The ekg ordered today demonstrates normal sinus rhythm, HR 88, normal axis, LVH with inf-lat ST depression, QTc 503, similar to old ECGs  Recent Labs: No results found for requested labs within last 8760 hours.   Recent Lipid Panel Lab Results  Component Value Date/Time   CHOL 138 06/10/2017 12:19 PM   TRIG 98 06/10/2017 12:19 PM   HDL 47 06/10/2017 12:19 PM   CHOLHDL 2.9 06/10/2017 12:19 PM   CHOLHDL 5.4 (H) 12/17/2016 11:20 AM   LDLCALC 71 06/10/2017 12:19 PM    Physical Exam:    VS:  BP (!) 130/100   Pulse 88   Ht 6' (1.829 m)   Wt 191 lb 1.9 oz (86.7 kg)   SpO2 96%   BMI 25.92 kg/m     Wt Readings from Last 3 Encounters:   03/16/19 191 lb 1.9 oz (86.7 kg)  10/22/17 188 lb 12.8 oz (85.6 kg)  10/15/17 190 lb (86.2 kg)     Physical Exam  Constitutional: He is oriented to person, place, and time. He appears well-developed and well-nourished. No distress.  HENT:  Head: Normocephalic and atraumatic.  Eyes: No scleral icterus.  Neck: Neck supple. No JVD present. No thyromegaly present.  Cardiovascular: Normal rate, regular rhythm, S1 normal and S2 normal.  No murmur heard. Pulmonary/Chest: Breath sounds normal. He has no rales.  Abdominal: Soft. There is no hepatomegaly.  Musculoskeletal:        General: No edema.  Lymphadenopathy:    He has no cervical adenopathy.  Neurological: He is alert and oriented  to person, place, and time.  Skin: Skin is warm and dry.  Psychiatric: He has a normal mood and affect.    ASSESSMENT & PLAN:    Acute on chronic combined systolic and diastolic CHF (congestive heart failure) (HCC) -  EF 25.  NYHA 2-3a.  Exam is stable without overt evidence of volume overload. But, his symptoms suggest he is decompensated.  He thinks that he is still taking Coreg.  He felt much better while he was taking the [facility] with less shortness of breath.  I will restart his Lasix and Entresto.  Check CMET, BNP today and recheck BMET in 1 week.  He will come back in close FU with Dr. Tenny Craw or me in the next 2 weeks.   Hypertensive heart disease with acute on chronic combined systolic and diastolic congestive heart failure (HCC) BP uncontrolled.  Restart Entresto, Lasix as noted.  Continue Coreg.  Will see what his BP is when he returns for follow up.  CKD (chronic kidney disease) stage 3, GFR 30-59 ml/min (HCC) Will keep a close eye on his renal function with a repeat BMET in 1 week.  NSVT (nonsustained ventricular tachycardia) (HCC) Continue Amiodarone.  He has refused ICD in the past.  Obtain CMET, TSH today.  Coronary artery disease involving native coronary artery of native heart without  angina pectoris   History of stenting to the LAD and RCA in 2016 and subsequent CABG after non-ST elevation myocardial infarction in April 2018.  He is not having any angina.  Continue ASA, beta-blocker, statin.   Dispo:  Return in about 2 weeks (around 03/30/2019) for Close Follow Up, w/ Dr. Tenny Craw, or Tereso Newcomer, PA-C.   Medication Adjustments/Labs and Tests Ordered: Current medicines are reviewed at length with the patient today.  Concerns regarding medicines are outlined above.  Tests Ordered: Orders Placed This Encounter  Procedures  . Comprehensive metabolic panel  . Pro b natriuretic peptide (BNP)  . TSH  . Basic metabolic panel  . EKG 12-Lead   Medication Changes: Meds ordered this encounter  Medications  . sacubitril-valsartan (ENTRESTO) 24-26 MG    Sig: Take 1 tablet by mouth 2 (two) times daily.    Dispense:  60 tablet    Refill:  11    Order Specific Question:   Supervising Provider    Answer:   Vesta Mixer [[id number]]  . furosemide (LASIX) 40 MG tablet    Sig: Take 1 tablet (40 mg total) by mouth daily.    Dispense:  30 tablet    Refill:  11    Order Specific Question:   Supervising Provider    Answer:   Vesta Mixer [[id number]]    Signed, Tereso Newcomer, PA-C  03/16/2019 12:47 PM    [facility] [address] [place], [place], [place]  [address] Phone: [phone]; Fax: [phone]

## 2019-03-16 NOTE — Telephone Encounter (Signed)
**Note De-Identified Ginnifer Creelman Obfuscation** I have completed an [employer] PA request form, Tereso Newcomer, PA-c has signed it, and I have faxed it to [employer].

## 2019-03-16 NOTE — Patient Instructions (Addendum)
Medication Instructions:   START TAKING ENTRESTO 24/26 MG TWICE A DAY   START TAKING LASIX 40 MG ONCE DAY   If you need a refill on your cardiac medications before your next appointment, please call your pharmacy.   Lab work: CMET  TSH AND BMP TODAY   RETURN IN ONE WEEK FOR BMET   If you have labs (blood work) drawn today and your tests are completely normal, you will receive your results only by: Marland Kitchen MyChart Message (if you have MyChart) OR . A paper copy in the mail If you have any lab test that is abnormal or we need to change your treatment, we will call you to review the results.  Testing/Procedures: NONE ORDERED  TODAY '  Follow-Up: IN ONE WEEK WITH WEAVER    Any Other Special Instructions Will Be Listed Below (If Applicable).  PLEASE CALL us WHEN YOU GET HOME AND TELL us EXACTLY WHAT MEDICATIONS YOU ARE CURRENTLY TAKING.

## 2019-03-16 NOTE — Progress Notes (Deleted)
Cardiology Office Note:    Date:  03/16/2019   ID:  Alan Diaz, DOB Jun 13, 1965, MRN [id number]  PCP:  Patient, No Pcp Per  Cardiologist:  Dietrich Pates, MD *** Electrophysiologist:  Hillis Range, MD   Referring MD: No ref. provider found   No chief complaint on file. ***  History of Present Illness:    Alan Diaz is a 54 y.o. male with coronary artery disease, ischemic cardiomyopathy, chronic combined systolic and diastolic CHF, diabetes, hypertension, hyperlipidemia and tobacco use.  He has a hx of DES to pLAD and dRCA in 05/2015.  He then suffered a NSTEMI in 4/18 followed by coronary artery bypass grafting with LIMA-LAD, SVG-OM, SVG-PDA.  He did have postoperative nonsustained ventricular tachycardia and was treated with amiodarone.  Follow-up echocardiogram continued to demonstrate severe LV dysfunction (EF 25-30) and he was seen by Dr. Johney Frame for EP consultation.  ICD implantation was recommended but the patient declined.  He was last seen in 09/2017 after a recent admission for decompensated HF.  I placed him on Entresto at his last visit.  ***  Alan Diaz ***  Prior CV studies:   The following studies were reviewed today:  *** Echocardiogram 09/13/17 Mild LVH, EF 25-30, diffuse HK, grade 2 diastolic dysfunction, trivial MR, mild TR, PASP 20  Echocardiogram 07/25/17 Mild concentric LVH, EF 25-30, severe diffuse HK, inferior and inferolateral akinesis, grade 2 diastolic dysfunction, trivial MR  Intraoperative TEE 04/24/17 1+ MR, 1+ TR, severe LV dysfunction  LHC 04/24/17 LM 95 LAD ostial stent patent, mid 40 LCx ostial 80 RCA proximal 40, mid stent patent EF <25 IABP placed  Echo 04/24/17 Mild LVH, EF 25, diffuse HK, grade 1 diastolic dysfunction, trivial MR  Echo 09/10/16 EF 35-40, diffuse HK, grade 2 diastolic dysfunction  Past Medical History:  Diagnosis Date  . CAD (coronary artery disease) 2016   a. NSTEMI 5/16 s/p DES to pLAD and dRCA // b. NSTEMI  4/18 >> 95% LM, oLAD stent ok, oLCx 80, mRCA stent ok, EF < 25 >> IABP // c. s/p CABG with Dr. Zenaida Niece Trig (L-LAD, S-OM, S-PDA)  . CHF (congestive heart failure) (HCC)   . Diabetes mellitus   . GERD (gastroesophageal reflux disease)   . HLD (hyperlipidemia)   . Hypertension   . Ischemic cardiomyopathy    a. 2D ECHO 05/02/15 with EF 25-30%, mild LV dilation. Mild LVH, mod HKof the mid-apicalanteroseptal myocardium, G1DD // b. Mild LVH, EF 25, diffuse HK, grade 1 diastolic dysfunction, trivial MR // Echo 7/18: Mild concentric LVH, EF 25-30, inferior and inferolateral AK, grade 2 diastolic dysfunction, trivial MR/PI   . Tobacco abuse    Surgical Hx: The patient  has a past surgical history that includes Wisdom tooth extraction; Cardiac catheterization (N/A, 05/03/2015); percutaneous coronary stent intervention (pci-s) (05/03/2015); Cardiac catheterization (N/A, 05/03/2015); Cardiac catheterization (N/A, 05/03/2015); LEFT HEART CATH AND CORONARY ANGIOGRAPHY (N/A, 04/24/2017); IABP Insertion (N/A, 04/24/2017); Coronary artery bypass graft (N/A, 04/24/2017); and TEE without cardioversion (N/A, 04/24/2017).   Current Medications: No outpatient medications have been marked as taking for the 03/16/19 encounter (Appointment) with Tereso Newcomer T, PA-C.     Allergies:   Other   Social History   Tobacco Use  . Smoking status: Current Some Day Smoker    Packs/day: 0.10    Types: Cigarettes    Last attempt to quit: 05/03/2015    Years since quitting: 3.8  . Smokeless tobacco: Never Used  Substance Use Topics  . Alcohol use: No  Alcohol/week: 0.0 standard drinks    Comment: very occasionally  . Drug use: No    Types: Marijuana    Comment: denies 06/28/14     Family Hx: The patient's family history includes Breast cancer in his mother; Diabetes in his brother and sister; Heart disease in his father; Hypertension in his brother and sister.  ROS:   Please see the history of present illness.    ROS All other  systems reviewed and are negative.   EKGs/Labs/Other Test Reviewed:    EKG:  EKG is *** ordered today.  The ekg ordered today demonstrates ***  Recent Labs: No results found for requested labs within last 8760 hours.   Recent Lipid Panel Lab Results  Component Value Date/Time   CHOL 138 06/10/2017 12:19 PM   TRIG 98 06/10/2017 12:19 PM   HDL 47 06/10/2017 12:19 PM   CHOLHDL 2.9 06/10/2017 12:19 PM   CHOLHDL 5.4 (H) 12/17/2016 11:20 AM   LDLCALC 71 06/10/2017 12:19 PM    Physical Exam:    VS:  There were no vitals taken for this visit.    Wt Readings from Last 3 Encounters:  10/22/17 188 lb 12.8 oz (85.6 kg)  10/15/17 190 lb (86.2 kg)  09/14/17 186 lb 4.8 oz (84.5 kg)     ***Physical Exam  ASSESSMENT & PLAN:    No diagnosis found.*** 1. Chronic combined systolic and diastolic CHF (congestive heart failure) (HCC) EF 25.  Recent admission for decompensated heart failure.  He is NYHA class 2.  Volume appears stable.  I think he would benefit from the addition of Entresto.             -  Continue current dose of beta-blocker.             -  Discontinue lisinopril             -  Start Entresto 24/26 mg twice daily             -  Decrease Lasix to 40 mg QD.             -  BMET today, repeat 1 week             -  Consider resuming spironolactone depending upon renal function and potassium on follow-up labs  2. Coronary artery disease involving native coronary artery of native heart without angina pectoris History of stenting to the LAD and RCA in 2016 and subsequent CABG after non-ST elevation myocardial infarction in April 2018.  He is doing well without angina.  Continue aspirin, statin, beta-blocker.  If I have recommended he stop smoking.  3. NSVT (nonsustained ventricular tachycardia) (HCC) He has been evaluated by Dr. Johney Frame for consideration of ICD implantation.  He is not currently interested in pursuing ICD implantation.  Continue amiodarone for now.  I will  discuss this further with Dr. Tenny Craw.  4. CKD (chronic kidney disease) stage 3, GFR 30-59 ml/min (HCC) Repeat basic metabolic panel today and again in 1 week with adjustments of medications.  Dispo:  No follow-ups on file.   Medication Adjustments/Labs and Tests Ordered: Current medicines are reviewed at length with the patient today.  Concerns regarding medicines are outlined above.  Tests Ordered: No orders of the defined types were placed in this encounter.  Medication Changes: No orders of the defined types were placed in this encounter.   Signed, Tereso Newcomer, PA-C  03/16/2019 8:06 AM    [facility]  [address], [place], [place]  [address] Phone: [phone]; Fax: [phone]    

## 2019-03-18 LAB — COMPREHENSIVE METABOLIC PANEL
ALT: 34 IU/L (ref 0–44)
AST: 17 IU/L (ref 0–40)
Albumin/Globulin Ratio: 1.4 (ref 1.2–2.2)
Albumin: 3.9 g/dL (ref 3.8–4.9)
Alkaline Phosphatase: 106 IU/L (ref 39–117)
BUN/Creatinine Ratio: 8 — ABNORMAL LOW (ref 9–20)
BUN: 12 mg/dL (ref 6–24)
Bilirubin Total: 0.5 mg/dL (ref 0.0–1.2)
CHLORIDE: 99 mmol/L (ref 96–106)
CO2: 23 mmol/L (ref 20–29)
Calcium: 9 mg/dL (ref 8.7–10.2)
Creatinine, Ser: 1.52 mg/dL — ABNORMAL HIGH (ref 0.76–1.27)
GFR calc non Af Amer: 51 mL/min/{1.73_m2} — ABNORMAL LOW (ref >59)
GFR, EST AFRICAN AMERICAN: 59 mL/min/{1.73_m2} — AB (ref >59)
Globulin, Total: 2.7 g/dL (ref 1.5–4.5)
Glucose: 339 mg/dL — ABNORMAL HIGH (ref 65–99)
Potassium: 4.5 mmol/L (ref 3.5–5.2)
Sodium: 138 mmol/L (ref 134–144)
Total Protein: 6.6 g/dL (ref 6.0–8.5)

## 2019-03-18 LAB — TSH: TSH: 0.779 u[IU]/mL (ref 0.450–4.500)

## 2019-03-18 LAB — PRO B NATRIURETIC PEPTIDE: NT-Pro BNP: 4478 pg/mL — ABNORMAL HIGH (ref 0–121)

## 2019-03-18 NOTE — Telephone Encounter (Signed)
I called [employer] and was advised by Lupita Leash that this [facility] PA has been approved. PA#: [id number] Approval good until 03/10/2020.  I have notified the pts pharmacy of this approval.

## 2019-03-23 ENCOUNTER — Ambulatory Visit: Payer: Medicaid Other | Admitting: Physician Assistant

## 2019-04-05 ENCOUNTER — Other Ambulatory Visit: Payer: Self-pay | Admitting: Physician Assistant

## 2019-09-17 ENCOUNTER — Other Ambulatory Visit: Payer: Self-pay | Admitting: Physician Assistant

## 2019-09-17 MED ORDER — ATORVASTATIN CALCIUM 80 MG PO TABS: 80.0000 mg | ORAL_TABLET | Freq: Every day | ORAL | 1 refills | Status: DC

## 2019-09-17 NOTE — Telephone Encounter (Signed)
Pt's medication was sent to pt's pharmacy as requested. Confirmation received.  °

## 2019-09-22 ENCOUNTER — Other Ambulatory Visit: Payer: Self-pay | Admitting: Physician Assistant

## 2019-09-22 MED ORDER — CLOPIDOGREL BISULFATE 75 MG PO TABS: 75.0000 mg | ORAL_TABLET | Freq: Every day | ORAL | 1 refills | Status: DC

## 2019-09-22 NOTE — Telephone Encounter (Signed)
Pt's medication was sent to pt's pharmacy as requested. Confirmation received.  °

## 2020-05-11 ENCOUNTER — Telehealth: Payer: Self-pay

## 2020-05-11 NOTE — Telephone Encounter (Signed)
[facility] pharmacy on [address] is requesting a refill on Entresto 24-26 mg tablet. This medication was D/C off of pt's medication list. I do not see where the provider D/C this medication. Please address

## 2020-05-13 MED ORDER — ENTRESTO 24-26 MG PO TABS: 1.0000 | ORAL_TABLET | Freq: Two times a day (BID) | ORAL | 6 refills | Status: DC

## 2020-05-13 NOTE — Telephone Encounter (Signed)
Per last OV note patient should be on Entresto 24-26 mg, 1 tablet by mouth twice a day. PA has also been approved for [employer]. Prescription sent to pharmacy.

## 2020-05-17 ENCOUNTER — Telehealth: Payer: Self-pay

## 2020-05-17 NOTE — Telephone Encounter (Signed)
I have completed a [employer] PA form, scanned and emailed it to Dr [facility] nurse (along with OV notes from 03/16/19-showing improvement since starting [facility]) so she can obtain Dr Charlott Rakes signature, date it and fax to number written on cover letter.

## 2020-05-18 NOTE — Telephone Encounter (Signed)
Printed document and placed on Dr. Charlott Rakes desk for signature.

## 2020-05-19 NOTE — Telephone Encounter (Signed)
Faxed signed paper work

## 2020-05-23 NOTE — Progress Notes (Signed)
Cardiology Office Note   Date:  05/24/2020   ID:  Alan Diaz, DOB 09-Mar-1965, MRN [id number]  PCP:  Alan Diaz, No Pcp Per  Cardiologist:  Dr. Tenny Craw    Chief Complaint  Alan Diaz presents with  . Cardiomyopathy      History of Present Illness: Alan Diaz is a 55 y.o. male who presents for cardiomyopathy   He has hx of  coronary artery disease, ischemic cardiomyopathy, chronic combined systolic and diastolic CHF,diabetes,hypertension,hyperlipidemiaand tobacco use.He has a hx of DES to pLAD and dRCA in 05/2015.  He then suffered a NSTEMI in 4/18 followed by coronary artery bypass graftingwithLIMA-LAD, SVG-OM, SVG-PDA. He did have postoperative nonsustained ventricular tachycardia and was treated with amiodarone. Follow-up echocardiogram continued to demonstrate severe LV dysfunction (EF 25-30) and he was seen by Dr. Johney Frame for EP consultation. ICD implantation was recommended but the Alan Diaz declined.  He was last seen in 09/2017 after a recent admission for decompensated HF.  I placed him on Entresto at his last visit.    Last visit 03/16/19 he returned for follow up.  He ran out of Lasix and [name] a couple of weeks ago.  He was feeling very good on Entresto.  Since he ran out of both medications, he has felt more short of breath and notes orthopnea.  He has not had chest pain, leg swelling or syncope.  He is pretty sure he is still taking Coreg, Atorvastatin, ASA, Amiodarone.    Last visit he was restarted on entresto but never followed up.    Today he notes it has been a hard year, a sister in [place] died of COVID and he knows he has not done what he needs to take care of himself.  Currently he has gained--  Wt though on last visit was 191 lbs.  He is SOB and he complains of increased dyspnea.  No chest pain.  No palpitations - he never had COVID and is asking about haw to obtain vaccine.   He has coughed more and some blood streaked in sputum.   Past Medical History:    Diagnosis Date  . CAD (coronary artery disease) 2016   a. NSTEMI 5/16 s/p DES to pLAD and dRCA // b. NSTEMI 4/18 >> 95% LM, oLAD stent ok, oLCx 80, mRCA stent ok, EF < 25 >> IABP // c. s/p CABG with Dr. Zenaida Niece Trig (L-LAD, S-OM, S-PDA)  . CHF (congestive heart failure) (HCC)   . Chronic combined systolic and diastolic CHF (congestive heart failure) (HCC) 05/03/2015  . CKD (chronic kidney disease) stage 3, GFR 30-59 ml/min 04/23/2017  . Diabetes mellitus   . GERD (gastroesophageal reflux disease)   . HLD (hyperlipidemia)   . Hypertension   . Ischemic cardiomyopathy    a. 2D ECHO 05/02/15 with EF 25-30%, mild LV dilation. Mild LVH, mod HKof the mid-apicalanteroseptal myocardium, G1DD // b. Mild LVH, EF 25, diffuse HK, grade 1 diastolic dysfunction, trivial MR // Echo 7/18: Mild concentric LVH, EF 25-30, inferior and inferolateral AK, grade 2 diastolic dysfunction, trivial MR/PI   . Left main coronary artery thrombosis (HCC)   . NSTEMI (non-ST elevated myocardial infarction) (HCC) 05/05/2015  . Tobacco abuse   . Type 2 diabetes mellitus with circulatory disorder (HCC) 10/31/2008   Qualifier: Diagnosis of  By: Daphine Deutscher FNP, Zena Amos      Past Surgical History:  Procedure Laterality Date  . CARDIAC CATHETERIZATION N/A 05/03/2015   Procedure: Right/Left Heart Cath and Coronary Angiography;  Surgeon: Peter M Swaziland, MD;  [facility];  Service: Cardiovascular;  Laterality: N/A;  . CARDIAC CATHETERIZATION N/A 05/03/2015   Procedure: Intravascular Ultrasound/IVUS;  Surgeon: Peter M Swaziland, MD;  [facility]: [facility];  Service: Cardiovascular;  Laterality: N/A;  . CARDIAC CATHETERIZATION N/A 05/03/2015   Procedure: Coronary Stent Intervention;  Surgeon: Peter M Swaziland, MD;  [facility]: [facility];  Service: Cardiovascular;  Laterality: N/A;  rca  . CORONARY ARTERY BYPASS GRAFT N/A 04/24/2017   Procedure: CORONARY ARTERY BYPASS GRAFTING (CABG) times three using left  intenal mammary artery and right saphenous vein;  Surgeon: Kerin Perna, MD;  Location: [facility] OR;  Service: Open Heart Surgery;  Laterality: N/A;  . IABP INSERTION N/A 04/24/2017   Procedure: IABP Insertion;  Surgeon: Marykay Lex, MD;  Location: [facility] INVASIVE CV LAB;  Service: Cardiovascular;  Laterality: N/A;  . LEFT HEART CATH AND CORONARY ANGIOGRAPHY N/A 04/24/2017   Procedure: Left Heart Cath and Coronary Angiography;  Surgeon: Marykay Lex, MD;  Location: [facility] INVASIVE CV LAB;  Service: Cardiovascular;  Laterality: N/A;  . PERCUTANEOUS CORONARY STENT INTERVENTION (PCI-S)  05/03/2015   Procedure: Percutaneous Coronary Stent Intervention (Pci-S);  Surgeon: Peter M Swaziland, MD;  Location: [facility] INVASIVE CV LAB CUPID;  Service: Cardiovascular;;  Prox LAD  . TEE WITHOUT CARDIOVERSION N/A 04/24/2017   Procedure: TRANSESOPHAGEAL ECHOCARDIOGRAM (TEE);  Surgeon: Kerin Perna, MD;  Location: [facility] OR;  Service: Open Heart Surgery;  Laterality: N/A;  . WISDOM TOOTH EXTRACTION     bottom LT, top RT     Current Outpatient Medications  Medication Sig Dispense Refill  . amiodarone (PACERONE) 200 MG tablet TAKE 1 TABLET(200 MG) BY MOUTH DAILY 30 tablet 11  . aspirin EC 81 MG tablet Take 81 mg by mouth daily.    Marland Kitchen atorvastatin (LIPITOR) 80 MG tablet Take 1 tablet (80 mg total) by mouth daily. 90 tablet 1  . carvedilol (COREG) 12.5 MG tablet Take 1 tablet by mouth twice daily with a meal. Alan Diaz overdue for an appointment and must call and schedule for further refills1st attempt 180 tablet 3  . clopidogrel (PLAVIX) 75 MG tablet Take 1 tablet (75 mg total) by mouth daily. 90 tablet 1  . furosemide (LASIX) 40 MG tablet Take 1 tablet (40 mg total) by mouth daily. 30 tablet 11  . metFORMIN (GLUCOPHAGE) 1000 MG tablet Take 1,000 mg by mouth two times a day  1  . sacubitril-valsartan (ENTRESTO) 24-26 MG Take 1 tablet by mouth 2 (two) times daily. 180 tablet 3   No current facility-administered medications for this  visit.    Allergies:   Other    Social History:  The Alan Diaz  reports that he has been smoking cigarettes. He has been smoking about 0.10 packs per day. He has never used smokeless tobacco. He reports that he does not drink alcohol or use drugs.   Family History:  The Alan Diaz's family history includes Breast cancer in his mother; Diabetes in his brother and sister; Heart disease in his father; Hypertension in his brother and sister.    ROS:  General:no colds or fevers, no weight changes Skin:no rashes or ulcers HEENT:no blurred vision, no congestion CV:see HPI PUL:see HPI GI:no diarrhea constipation or melena, no indigestion GU:no hematuria, no dysuria MS:no joint pain, no claudication Neuro:no syncope, no lightheadedness Endo:+ diabetes, no thyroid disease  Wt Readings from Last 3 Encounters:  05/24/20 193 lb (87.5 kg)  03/16/19 191 lb 1.9 oz (86.7 kg)  10/22/17 188 lb 12.8 oz (85.6 kg)     PHYSICAL EXAM: VS:  BP (!) 142/90   Pulse 74   Ht 6' (1.829 m)   Wt 193 lb (87.5 kg)   SpO2 96%   BMI 26.18 kg/m  , BMI Body mass index is 26.18 kg/m. General:Pleasant affect, NAD Skin:Warm and dry, brisk capillary refill HEENT:normocephalic, sclera clear, mucus membranes moist Neck:supple, no JVD, no bruits  Heart:S1S2 RRR without murmur, gallup, rub or click Lungs: rales in bases, + rhonchi, and occ wheeze ZOX:WRUE, non tender, + BS, do not palpate liver spleen or masses Ext:no lower ext edema, 2+ pedal pulses, 2+ radial pulses Neuro:alert and oriented X 3, MAE, follows commands, + facial symmetry    EKG:  EKG is ordered today. The ekg ordered today demonstrates SR at 74 LVH previous Qtc prolongation improved. Stable EKG    Recent Labs: No results found for requested labs within last 8760 hours.    Lipid Panel    Component Value Date/Time   CHOL 138 06/10/2017 1219   TRIG 98 06/10/2017 1219   HDL 47 06/10/2017 1219   CHOLHDL 2.9 06/10/2017 1219   CHOLHDL 5.4 (H)  12/17/2016 1120   VLDL 26 12/17/2016 1120   LDLCALC 71 06/10/2017 1219       Other studies Reviewed: Additional studies/ records that were reviewed today include: . Echocardiogram 09/13/17 Mild LVH, EF 25-30, diffuse HK, grade 2 diastolic dysfunction, trivial MR, mild TR, PASP 20  Echocardiogram 07/25/17 Mild concentric LVH, EF 25-30, severe diffuse HK, inferior and inferolateral akinesis, grade 2 diastolic dysfunction, trivial MR  Intraoperative TEE 04/24/17 1+ MR, 1+ TR, severe LV dysfunction  LHC 04/24/17 LM 95 LAD ostial stent patent, mid 40 LCx ostial 80 RCA proximal 40, mid stent patent EF <25 IABP placed  Echo 04/24/17 Mild LVH, EF 25, diffuse HK, grade 1 diastolic dysfunction, trivial MR  Echo 09/10/16 EF 35-40, diffuse HK, grade 2 diastolic dysfunction  04/25/17 CABG Emergency coronary artery bypass grafting x3 (left internal mammary     artery to left anterior descending, saphenous vein graft to     circumflex   ASSESSMENT AND PLAN:  1.  Acute on chronic and diastolic HF. He has not been taking his lasix daily - will give 80 mg today and tomorrow the 40 mg daily.  Will check CMP Lipids today  NYHA class 2  Follow up in 2 weeks.  EKG stable.   2.  Hypertensive heart disease with HF  On coreg continue and begin entresto 24-26 mg bid - we have contacted company for medication, today we gave samples.  Labs today  Follow up in 2 weeks may need dose adjustment    3.  NSVT on amiodarone.  Has refused ICD in past.  Continue amio will need TSH on next labs in 2 weeks  4.  ICM with Ef 25% on BB and entresto. Consider echo once  Back on meds  5.  CAD with CABG 2018 with 95% LM disease no angina   6.  CKD 3a- will recheck labs today   7.  Tobacco use on occ a rare cigarette  8.  Hemoptysis check CXR.   9. Dyspnea see above.   10.  hld on statin continue   Current medicines are reviewed with the Alan Diaz today.  The Alan Diaz Has no concerns regarding  medicines.  The following changes have been made:  See above Labs/ tests ordered today include:see above  Disposition:   FU:  see above  Signed, Cecilie Kicks, NP  05/24/2020 12:15 PM    [address], [place], [place] Phone: [phone]; Fax: [phone]

## 2020-05-24 ENCOUNTER — Encounter: Payer: Self-pay | Admitting: Cardiology

## 2020-05-24 ENCOUNTER — Other Ambulatory Visit: Payer: Self-pay

## 2020-05-24 ENCOUNTER — Ambulatory Visit
Admission: RE | Admit: 2020-05-24 | Discharge: 2020-05-24 | Disposition: A | Discharge status: Home / Self Care | Payer: Medicaid Other | Source: Ambulatory Visit | Attending: Cardiology | Admitting: Cardiology

## 2020-05-24 ENCOUNTER — Ambulatory Visit (INDEPENDENT_AMBULATORY_CARE_PROVIDER_SITE_OTHER): Payer: Medicaid Other | Admitting: Cardiology

## 2020-05-24 VITALS — BP 142/90 | HR 74 | Ht 72.0 in | Wt 193.0 lb

## 2020-05-24 DIAGNOSIS — I110 Hypertensive heart disease with heart failure: Secondary | ICD-10-CM

## 2020-05-24 DIAGNOSIS — I4729 Other ventricular tachycardia: Secondary | ICD-10-CM

## 2020-05-24 DIAGNOSIS — E7849 Other hyperlipidemia: Secondary | ICD-10-CM | POA: Diagnosis not present

## 2020-05-24 DIAGNOSIS — I255 Ischemic cardiomyopathy: Secondary | ICD-10-CM

## 2020-05-24 DIAGNOSIS — F17200 Nicotine dependence, unspecified, uncomplicated: Secondary | ICD-10-CM

## 2020-05-24 DIAGNOSIS — I5043 Acute on chronic combined systolic (congestive) and diastolic (congestive) heart failure: Secondary | ICD-10-CM | POA: Diagnosis not present

## 2020-05-24 DIAGNOSIS — R0602 Shortness of breath: Secondary | ICD-10-CM

## 2020-05-24 DIAGNOSIS — N1831 Chronic kidney disease, stage 3a: Secondary | ICD-10-CM

## 2020-05-24 DIAGNOSIS — I2510 Atherosclerotic heart disease of native coronary artery without angina pectoris: Secondary | ICD-10-CM

## 2020-05-24 DIAGNOSIS — I472 Ventricular tachycardia: Secondary | ICD-10-CM

## 2020-05-24 DIAGNOSIS — R042 Hemoptysis: Secondary | ICD-10-CM

## 2020-05-24 LAB — HEPATIC FUNCTION PANEL
ALT: 8 IU/L (ref 0–44)
AST: 11 IU/L (ref 0–40)
Albumin: 3.7 g/dL — ABNORMAL LOW (ref 3.8–4.9)
Alkaline Phosphatase: 119 IU/L (ref 48–121)
Bilirubin Total: 0.6 mg/dL (ref 0.0–1.2)
Bilirubin, Direct: 0.17 mg/dL (ref 0.00–0.40)
Total Protein: 6.7 g/dL (ref 6.0–8.5)

## 2020-05-24 LAB — BASIC METABOLIC PANEL
BUN/Creatinine Ratio: 6 — ABNORMAL LOW (ref 9–20)
BUN: 9 mg/dL (ref 6–24)
CO2: 24 mmol/L (ref 20–29)
Calcium: 8.7 mg/dL (ref 8.7–10.2)
Chloride: 98 mmol/L (ref 96–106)
Creatinine, Ser: 1.41 mg/dL — ABNORMAL HIGH (ref 0.76–1.27)
GFR calc Af Amer: 64 mL/min/{1.73_m2} (ref >59)
GFR calc non Af Amer: 56 mL/min/{1.73_m2} — ABNORMAL LOW (ref >59)
Glucose: 351 mg/dL — ABNORMAL HIGH (ref 65–99)
Potassium: 3.9 mmol/L (ref 3.5–5.2)
Sodium: 137 mmol/L (ref 134–144)

## 2020-05-24 LAB — LIPID PANEL
Chol/HDL Ratio: 4.4 ratio (ref 0.0–5.0)
Cholesterol, Total: 174 mg/dL (ref 100–199)
HDL: 40 mg/dL (ref >39)
LDL Chol Calc (NIH): 108 mg/dL — ABNORMAL HIGH (ref 0–99)
Triglycerides: 145 mg/dL (ref 0–149)
VLDL Cholesterol Cal: 26 mg/dL (ref 5–40)

## 2020-05-24 MED ORDER — ENTRESTO 24-26 MG PO TABS: 1.0000 | ORAL_TABLET | Freq: Two times a day (BID) | ORAL | 3 refills | Status: AC

## 2020-05-24 MED ORDER — CARVEDILOL 12.5 MG PO TABS: ORAL_TABLET | ORAL | 3 refills | Status: AC

## 2020-05-24 NOTE — Patient Instructions (Signed)
Medication Instructions:  1) Take Lasix 80mg  once daily for the next two days and then decrease back to 40mg  daily   *If you need a refill on your cardiac medications before your next appointment, please call your pharmacy*   Lab Work: BMET, Lipid and Liver today  If you have labs (blood work) drawn today and your tests are completely normal, you will receive your results only by: MyChart Message (if you have MyChart) OR . A paper copy in the mail If you have any lab test that is abnormal or we need to change your treatment, we will call you to review the results.   Testing/Procedures: A chest x-ray takes a picture of the organs and structures inside the chest, including the heart, lungs, and blood vessels. This test can show several things, including, whether the heart is enlarges; whether fluid is building up in the lungs; and whether pacemaker / defibrillator leads are still in place.  [facility] [address]   Follow-Up: At [facility], you and your health needs are our priority.  As part of our continuing mission to provide you with exceptional heart care, we have created designated Provider Care Teams.  These Care Teams include your primary Cardiologist (physician) and Advanced Practice Providers (APPs -  Physician Assistants and Nurse Practitioners) who all work together to provide you with the care you need, when you need it.  We recommend signing up for the patient portal called "MyChart".  Sign up information is provided on this After Visit Summary.  MyChart is used to connect with patients for Virtual Visits (Telemedicine).  Patients are able to view lab/test results, encounter notes, upcoming appointments, etc.  Non-urgent messages can be sent to your provider as well.   To learn more about what you can do with MyChart, go to 01-11-2005.    Your next appointment:   2 week(s)  The format for your next appointment:    In Person  Provider:   You may see [facility], MD or one of the following Advanced Practice Providers on your designated Care Team:    [email or web address], PA-C  Dietrich Pates, Tereso Newcomer    Other Instructions

## 2020-05-25 ENCOUNTER — Telehealth: Payer: Self-pay | Admitting: Internal Medicine

## 2020-05-25 DIAGNOSIS — E7849 Other hyperlipidemia: Secondary | ICD-10-CM

## 2020-05-25 NOTE — Telephone Encounter (Signed)
Sheryle Hail is returning Jennifer's call to discuss the patient's lab results. Please call.

## 2020-05-25 NOTE — Telephone Encounter (Signed)
Returned call.  Left a message to call back.  

## 2020-05-26 MED ORDER — FUROSEMIDE 40 MG PO TABS: 40.0000 mg | ORAL_TABLET | Freq: Every day | ORAL | 1 refills | Status: DC

## 2020-05-26 MED ORDER — ATORVASTATIN CALCIUM 80 MG PO TABS: 80.0000 mg | ORAL_TABLET | Freq: Every day | ORAL | 1 refills | Status: DC

## 2020-05-26 NOTE — Telephone Encounter (Signed)
Follow up ° ° °Patient is returning your call. Please call. ° ° ° °

## 2020-06-01 ENCOUNTER — Other Ambulatory Visit: Payer: Self-pay

## 2020-06-01 MED ORDER — ATORVASTATIN CALCIUM 80 MG PO TABS: 80.0000 mg | ORAL_TABLET | Freq: Every day | ORAL | 3 refills | Status: AC

## 2020-06-01 MED ORDER — AMIODARONE HCL 200 MG PO TABS: ORAL_TABLET | ORAL | 11 refills | Status: AC

## 2020-06-01 MED ORDER — CLOPIDOGREL BISULFATE 75 MG PO TABS: 75.0000 mg | ORAL_TABLET | Freq: Every day | ORAL | 3 refills | Status: AC

## 2020-06-01 MED ORDER — FUROSEMIDE 40 MG PO TABS: 40.0000 mg | ORAL_TABLET | Freq: Every day | ORAL | 3 refills | Status: AC

## 2020-06-05 NOTE — Progress Notes (Deleted)
Cardiology Office Note   Date:  06/05/2020   ID:  Alan Diaz, DOB 29-Sep-1965, MRN [id number]  PCP:  Patient, No Pcp Per  Cardiologist:  Georgie Chard, NP   No chief complaint on file.     History of Present Illness: Alan Diaz is a 55 y.o. male who presents for 2 week f/u, seen for Dr. Tenny Craw.   Alan Diaz has a history of CAD, ischemic cardiomyopathy, chronic combined systolic and diastolic CHF, DM 2, hypertension, hyperlipidemia and tobacco use.  He underwent DES/PCI to pLAD and dRCA and 05/2015.  NSTEMI 03/2017 followed by coronary artery bypass revascularization with LIMA-LAD, SVG-OM, SVG-PDA.  He had postoperative NSVT treated with amiodarone.  Follow-up echocardiogram showed severe LV dysfunction With an EF of 25 to 30%.  He was then referred to EP, Dr. Johney Frame and ICD implantation was recommended however the patient deferred.  09/2017 he had a hospital admission for decompensated heart failure and was placed on Entresto.  There was a lapse in cardiology follow-up until 03/16/2019 after running out of Lasix and Entresto.  At that point, he was doing well from a CV standpoint.  [name] was restarted and he was again lost to follow-up.  He was then seen by Nada Boozer NP 05/24/2020.  He reported a difficult year as his sister passed away of COVID-19 and he was not doing what he needs to do to take care of himself.  Largest complaint was shortness of breath and DOE felt likely to be secondary to medication noncompliance.  He was started on 80 mg x 2 days then resume 40 mg daily with close follow-up.   Regarding his cardiomyopathy, he was continued on carvedilol and once again started on Entresto 24/26>> given samples with plans for close follow-up for up titration.   Once again has deferred ICD placement therefore amiodarone was continued.   Did have complaints of hemoptysis therefore CXR was performed which showed here airspace opacities in the left lung, most evident  in the left lower lobe, consistent with pneumonia in the proper clinical setting.???   1.  Acute on chronic systolic and diastolic CHF: -At last OV had complaints of increased shortness of breath and DOE likely secondary to medication noncompliance therefore he was started back on Lasix 80 mg x 2 days with resumption of 40 mg daily -  2.  Ischemic cardiomyopathy: -Last echocardiogram with LVEF at 25 to 30%>> initially referred to EP for possible ICD implantation however patient deferred -Has been on and off of Entresto several times per chart review>> mostly secondary to poor follow-up -Entresto 24/26 resumed at last OV with plans to uptitrate -Creatinine, -Continue carvedilol -Once on GDMT will need repeat echocardiogram   3.  History of NSVT on amiodarone: -Known severe LV dysfunction with refusal of ICD placement in the past -Continue amiodarone -TSH,??? -LFTs, WNL  4.  CKD stage III: -Creatinine, 1.41 on 05/24/2020 -Potassium, 3.9  5.  Tobacco use: -Rare tobacco use  6.  Hemoptysis: -CXR performed at last OV -There are airspace opacities in the left lung, most evident in the left lower lobe, consistent with pneumonia in the proper clinical setting.  7.  HLD: -LDL, 108 on 05/24/2020 -LDL goal <70    Past Medical History:  Diagnosis Date  . CAD (coronary artery disease) 2016   a. NSTEMI 5/16 s/p DES to pLAD and dRCA // b. NSTEMI 4/18 >> 95% LM, oLAD stent ok, oLCx 80, mRCA stent ok, EF <  25 >> IABP // c. s/p CABG with Dr. Zenaida Niece Trig (L-LAD, S-OM, S-PDA)  . CHF (congestive heart failure) (HCC)   . Chronic combined systolic and diastolic CHF (congestive heart failure) (HCC) 05/03/2015  . CKD (chronic kidney disease) stage 3, GFR 30-59 ml/min 04/23/2017  . Diabetes mellitus   . GERD (gastroesophageal reflux disease)   . HLD (hyperlipidemia)   . Hypertension   . Ischemic cardiomyopathy    a. 2D ECHO 05/02/15 with EF 25-30%, mild LV dilation. Mild LVH, mod HKof the  mid-apicalanteroseptal myocardium, G1DD // b. Mild LVH, EF 25, diffuse HK, grade 1 diastolic dysfunction, trivial MR // Echo 7/18: Mild concentric LVH, EF 25-30, inferior and inferolateral AK, grade 2 diastolic dysfunction, trivial MR/PI   . Left main coronary artery thrombosis (HCC)   . NSTEMI (non-ST elevated myocardial infarction) (HCC) 05/05/2015  . Tobacco abuse   . Type 2 diabetes mellitus with circulatory disorder (HCC) 10/31/2008   Qualifier: Diagnosis of  By: Daphine Deutscher FNP, Zena Amos      Past Surgical History:  Procedure Laterality Date  . CARDIAC CATHETERIZATION N/A 05/03/2015   Procedure: Right/Left Heart Cath and Coronary Angiography;  Surgeon: Peter M Swaziland, MD;  Location: [facility];  Service: Cardiovascular;  Laterality: N/A;  . CARDIAC CATHETERIZATION N/A 05/03/2015   Procedure: Intravascular Ultrasound/IVUS;  Surgeon: Peter M Swaziland, MD;  Location: [facility];  Service: Cardiovascular;  Laterality: N/A;  . CARDIAC CATHETERIZATION N/A 05/03/2015   Procedure: Coronary Stent Intervention;  Surgeon: Peter M Swaziland, MD;  Location: [facility];  Service: Cardiovascular;  Laterality: N/A;  rca  . CORONARY ARTERY BYPASS GRAFT N/A 04/24/2017   Procedure: CORONARY ARTERY BYPASS GRAFTING (CABG) times three using left intenal mammary artery and right saphenous vein;  Surgeon: Kerin Perna, MD;  Location: [facility] OR;  Service: Open Heart Surgery;  Laterality: N/A;  . IABP INSERTION N/A 04/24/2017   Procedure: IABP Insertion;  Surgeon: Marykay Lex, MD;  Location: [facility] INVASIVE CV LAB;  Service: Cardiovascular;  Laterality: N/A;  . LEFT HEART CATH AND CORONARY ANGIOGRAPHY N/A 04/24/2017   Procedure: Left Heart Cath and Coronary Angiography;  Surgeon: Marykay Lex, MD;  Location: [facility] INVASIVE CV LAB;  Service: Cardiovascular;  Laterality: N/A;  . PERCUTANEOUS CORONARY STENT INTERVENTION (PCI-S)  05/03/2015   Procedure: Percutaneous Coronary Stent Intervention  (Pci-S);  Surgeon: Peter M Swaziland, MD;  Location: [facility] INVASIVE CV LAB CUPID;  Service: Cardiovascular;;  Prox LAD  . TEE WITHOUT CARDIOVERSION N/A 04/24/2017   Procedure: TRANSESOPHAGEAL ECHOCARDIOGRAM (TEE);  Surgeon: Kerin Perna, MD;  Location: [facility] OR;  Service: Open Heart Surgery;  Laterality: N/A;  . WISDOM TOOTH EXTRACTION     bottom LT, top RT     Current Outpatient Medications  Medication Sig Dispense Refill  . amiodarone (PACERONE) 200 MG tablet TAKE 1 TABLET(200 MG) BY MOUTH DAILY 30 tablet 11  . aspirin EC 81 MG tablet Take 81 mg by mouth daily.    Marland Kitchen atorvastatin (LIPITOR) 80 MG tablet Take 1 tablet (80 mg total) by mouth daily. 90 tablet 3  . carvedilol (COREG) 12.5 MG tablet Take 1 tablet by mouth twice daily with a meal. Patient overdue for an appointment and must call and schedule for further refills1st attempt 180 tablet 3  . clopidogrel (PLAVIX) 75 MG tablet Take 1 tablet (75 mg total) by mouth daily. 90 tablet 3  . furosemide (LASIX) 40 MG tablet Take 1 tablet (40 mg  total) by mouth daily. 90 tablet 3  . metFORMIN (GLUCOPHAGE) 1000 MG tablet Take 1,000 mg by mouth two times a day  1  . sacubitril-valsartan (ENTRESTO) 24-26 MG Take 1 tablet by mouth 2 (two) times daily. 180 tablet 3   No current facility-administered medications for this visit.    Allergies:   Other    Social History:  The patient  reports that he has been smoking cigarettes. He has been smoking about 0.10 packs per day. He has never used smokeless tobacco. He reports that he does not drink alcohol or use drugs.   Family History:  The patient's ***family history includes Breast cancer in his mother; Diabetes in his brother and sister; Heart disease in his father; Hypertension in his brother and sister.    ROS:  Please see the history of present illness.   Otherwise, review of systems are positive for {NONE DEFAULTED:18576::"none"}.   All other systems are reviewed and negative.    PHYSICAL EXAM: VS:   There were no vitals taken for this visit. , BMI There is no height or weight on file to calculate BMI. GEN: Well nourished, well developed, in no acute distress HEENT: normal Neck: no JVD, carotid bruits, or masses Cardiac: ***RRR; no murmurs, rubs, or gallops,no edema  Respiratory:  clear to auscultation bilaterally, normal work of breathing GI: soft, nontender, nondistended, + BS MS: no deformity or atrophy Skin: warm and dry, no rash Neuro:  Strength and sensation are intact Psych: euthymic mood, full affect   EKG:  EKG {ACTION; IS/IS ZDG:[id number]} ordered today. The ekg ordered today demonstrates ***   Recent Labs: 05/24/2020: ALT 8; BUN 9; Creatinine, Ser 1.41; Potassium 3.9; Sodium 137    Lipid Panel    Component Value Date/Time   CHOL 174 05/24/2020 1106   TRIG 145 05/24/2020 1106   HDL 40 05/24/2020 1106   CHOLHDL 4.4 05/24/2020 1106   CHOLHDL 5.4 (H) 12/17/2016 1120   VLDL 26 12/17/2016 1120   LDLCALC 108 (H) 05/24/2020 1106      Wt Readings from Last 3 Encounters:  05/24/20 193 lb (87.5 kg)  03/16/19 191 lb 1.9 oz (86.7 kg)  10/22/17 188 lb 12.8 oz (85.6 kg)      Other studies Reviewed: Additional studies/ records that were reviewed today include: ***. Review of the above records demonstrates: ***  Echocardiogram 09/13/17 Mild LVH, EF 25-30, diffuse HK, grade 2 diastolic dysfunction, trivial MR, mild TR, PASP 20  Echocardiogram 07/25/17 Mild concentric LVH, EF 25-30, severe diffuse HK, inferior and inferolateral akinesis, grade 2 diastolic dysfunction, trivial MR  Intraoperative TEE 04/24/17 1+ MR, 1+ TR, severe LV dysfunction  LHC 04/24/17 LM 95 LAD ostial stent patent, mid 40 LCx ostial 80 RCA proximal 40, mid stent patent EF <25 IABP placed  Echo 04/24/17 Mild LVH, EF 25, diffuse HK, grade 1 diastolic dysfunction, trivial MR  Echo 09/10/16 EF 35-40, diffuse HK, grade 2 diastolic dysfunction  2/59/56 CABG Emergency coronary artery  bypass grafting x3 (left internal mammary artery to left anterior descending, saphenous vein graft to circumflex    ASSESSMENT AND PLAN:  1.  ***   Current medicines are reviewed at length with the patient today.  The patient {ACTIONS; HAS/DOES NOT HAVE:19233} concerns regarding medicines.  The following changes have been made:  {PLAN; NO CHANGE:13088:s}  Labs/ tests ordered today include: *** No orders of the defined types were placed in this encounter.    Disposition:   FU with *** in {gen  number 7-10:[id number]} {Days to years:10300}  Signed, Georgie Chard, NP  06/05/2020 7:17 AM    [facility] [address] [place], [place], [place]  [address] Phone: [phone]; Fax: [phone]

## 2020-06-06 ENCOUNTER — Other Ambulatory Visit: Payer: Self-pay

## 2020-06-06 ENCOUNTER — Emergency Department (HOSPITAL_COMMUNITY): Payer: Medicaid Other

## 2020-06-06 ENCOUNTER — Emergency Department (HOSPITAL_COMMUNITY)
Admission: EM | Admit: 2020-06-06 | Discharge: 2020-06-06 | Disposition: A | Discharge status: Home / Self Care | Payer: Medicaid Other | Attending: Emergency Medicine | Admitting: Emergency Medicine

## 2020-06-06 ENCOUNTER — Encounter (HOSPITAL_COMMUNITY): Payer: Self-pay | Admitting: Emergency Medicine

## 2020-06-06 DIAGNOSIS — N183 Chronic kidney disease, stage 3 unspecified: Secondary | ICD-10-CM | POA: Diagnosis not present

## 2020-06-06 DIAGNOSIS — Z7982 Long term (current) use of aspirin: Secondary | ICD-10-CM | POA: Diagnosis not present

## 2020-06-06 DIAGNOSIS — Z7901 Long term (current) use of anticoagulants: Secondary | ICD-10-CM | POA: Insufficient documentation

## 2020-06-06 DIAGNOSIS — I5042 Chronic combined systolic (congestive) and diastolic (congestive) heart failure: Secondary | ICD-10-CM | POA: Diagnosis not present

## 2020-06-06 DIAGNOSIS — E1122 Type 2 diabetes mellitus with diabetic chronic kidney disease: Secondary | ICD-10-CM | POA: Diagnosis not present

## 2020-06-06 DIAGNOSIS — W19XXXA Unspecified fall, initial encounter: Secondary | ICD-10-CM

## 2020-06-06 DIAGNOSIS — M25511 Pain in right shoulder: Secondary | ICD-10-CM | POA: Diagnosis present

## 2020-06-06 DIAGNOSIS — Z7984 Long term (current) use of oral hypoglycemic drugs: Secondary | ICD-10-CM | POA: Insufficient documentation

## 2020-06-06 DIAGNOSIS — I2510 Atherosclerotic heart disease of native coronary artery without angina pectoris: Secondary | ICD-10-CM | POA: Diagnosis not present

## 2020-06-06 DIAGNOSIS — G8921 Chronic pain due to trauma: Secondary | ICD-10-CM | POA: Insufficient documentation

## 2020-06-06 DIAGNOSIS — W06XXXA Fall from bed, initial encounter: Secondary | ICD-10-CM | POA: Diagnosis not present

## 2020-06-06 DIAGNOSIS — I130 Hypertensive heart and chronic kidney disease with heart failure and stage 1 through stage 4 chronic kidney disease, or unspecified chronic kidney disease: Secondary | ICD-10-CM | POA: Insufficient documentation

## 2020-06-06 MED ORDER — DICLOFENAC SODIUM 1 % EX GEL: 4.0000 g | Freq: Four times a day (QID) | CUTANEOUS | 0 refills | Status: DC

## 2020-06-06 NOTE — ED Triage Notes (Signed)
Pt reports he fell a month ago, his right shoulder pain has continued to get worse.  OTC medication has not relieved the pain anymore.

## 2020-06-06 NOTE — ED Provider Notes (Signed)
[facility] EMERGENCY DEPARTMENT Provider Note   CSN: [id number] Arrival date & time: 06/06/20  8676   History Chief Complaint  Patient presents with  . Shoulder Pain    Alan Diaz is a 55 y.o. male with hx of CAD, CHF, DM, GERD who presents with R shoulder pain. He states he fell out of bed a month ago directly on to his right shoulder. He didn't have significant pain immediately after the injury however over the week following the fall he started to have pain over the posterior aspect of the R shoulder. He notes he is R handed. He has tried topical creams, Ibuprofen, Tylenol, and icing the area. This causes temporary relief of pain but the pain comes back quickly. He is not working but does yard work. Pain seems to improve when he warms his shoulder up. He has tried to rest for 4 weeks now and the pain is not improving so he came to the ED to see what's wrong. He denies any chest pain. He denies neck pain, numbness or tingling. He has an appt with his heart doctor tomorrow.  HPI   Past Medical History:  Diagnosis Date  . CAD (coronary artery disease) 2016   a. NSTEMI 5/16 s/p DES to pLAD and dRCA // b. NSTEMI 4/18 >> 95% LM, oLAD stent ok, oLCx 80, mRCA stent ok, EF < 25 >> IABP // c. s/p CABG with Dr. Zenaida Niece Trig (L-LAD, S-OM, S-PDA)  . CHF (congestive heart failure) (HCC)   . Chronic combined systolic and diastolic CHF (congestive heart failure) (HCC) 05/03/2015  . CKD (chronic kidney disease) stage 3, GFR 30-59 ml/min 04/23/2017  . Diabetes mellitus   . GERD (gastroesophageal reflux disease)   . HLD (hyperlipidemia)   . Hypertension   . Ischemic cardiomyopathy    a. 2D ECHO 05/02/15 with EF 25-30%, mild LV dilation. Mild LVH, mod HKof the mid-apicalanteroseptal myocardium, G1DD // b. Mild LVH, EF 25, diffuse HK, grade 1 diastolic dysfunction, trivial MR // Echo 7/18: Mild concentric LVH, EF 25-30, inferior and inferolateral AK, grade 2 diastolic dysfunction, trivial  MR/PI   . Left main coronary artery thrombosis (HCC)   . NSTEMI (non-ST elevated myocardial infarction) (HCC) 05/05/2015  . Tobacco abuse   . Type 2 diabetes mellitus with circulatory disorder (HCC) 10/31/2008   Qualifier: Diagnosis of  By: Daphine Deutscher FNP, Zena Amos      Patient Active Problem List   Diagnosis Date Noted  . NSVT (nonsustained ventricular tachycardia) (HCC) 05/17/2017  . 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 Left main coronary artery thrombosis (HCC)   . CKD (chronic kidney disease) stage 3, GFR 30-59 ml/min 04/23/2017  . Cardiomyopathy, ischemic 09/09/2016  . Non compliance w medication regimen 09/09/2016  . NSTEMI (non-ST elevated myocardial infarction) (HCC) 05/05/2015  . GERD (gastroesophageal reflux disease)   . HLD (hyperlipidemia)   . CAD (coronary artery disease)   . Chronic combined systolic and diastolic CHF (congestive heart failure) (HCC) 05/03/2015  . Essential hypertension 05/03/2015  . TOBACCO ABUSE 01/19/2009  . Type 2 diabetes mellitus with circulatory disorder (HCC) 10/31/2008    Past Surgical History:  Procedure Laterality Date  . CARDIAC CATHETERIZATION N/A 05/03/2015   Procedure: Right/Left Heart Cath and Coronary Angiography;  Surgeon: Peter M Swaziland, MD;  Location: [facility] INVASIVE CV LAB CUPID;  Service: Cardiovascular;  Laterality: N/A;  . CARDIAC CATHETERIZATION N/A 05/03/2015   Procedure: Intravascular Ultrasound/IVUS;  Surgeon: Peter M Swaziland, MD;  Location: [facility] INVASIVE CV  LAB CUPID;  Service: Cardiovascular;  Laterality: N/A;  . CARDIAC CATHETERIZATION N/A 05/03/2015   Procedure: Coronary Stent Intervention;  Surgeon: Peter M Martinique, MD;  Location: [place] INVASIVE CV LAB CUPID;  Service: Cardiovascular;  Laterality: N/A;  rca  . CORONARY ARTERY BYPASS GRAFT N/A 04/24/2017   Procedure: CORONARY ARTERY BYPASS GRAFTING (CABG) times three using left intenal mammary artery and right saphenous vein;  Surgeon: Ivin Poot, MD;  Location: [place];  Service: Open Heart Surgery;   Laterality: N/A;  . IABP INSERTION N/A 04/24/2017   Procedure: IABP Insertion;  Surgeon: Leonie Man, MD;  Location: [place] CV LAB;  Service: Cardiovascular;  Laterality: N/A;  . LEFT HEART CATH AND CORONARY ANGIOGRAPHY N/A 04/24/2017   Procedure: Left Heart Cath and Coronary Angiography;  Surgeon: Leonie Man, MD;  Location: [place] CV LAB;  Service: Cardiovascular;  Laterality: N/A;  . PERCUTANEOUS CORONARY STENT INTERVENTION (PCI-S)  05/03/2015   Procedure: Percutaneous Coronary Stent Intervention (Pci-S);  Surgeon: Peter M Martinique, MD;  Location: [facility] INVASIVE CV LAB CUPID;  Service: Cardiovascular;;  Prox LAD  . TEE WITHOUT CARDIOVERSION N/A 04/24/2017   Procedure: TRANSESOPHAGEAL ECHOCARDIOGRAM (TEE);  Surgeon: Ivin Poot, MD;  Location: [place];  Service: Open Heart Surgery;  Laterality: N/A;  . WISDOM TOOTH EXTRACTION     bottom LT, top RT       Family History  Problem Relation Age of Onset  . Breast cancer Mother   . Heart disease Father   . Diabetes Sister   . Hypertension Sister   . Hypertension Brother   . Diabetes Brother     Social History   Tobacco Use  . Smoking status: Current Some Day Smoker    Packs/day: 0.10    Types: Cigarettes    Last attempt to quit: 05/03/2015    Years since quitting: 5.0  . Smokeless tobacco: Never Used  Substance Use Topics  . Alcohol use: No    Alcohol/week: 0.0 standard drinks    Comment: very occasionally  . Drug use: No    Types: Marijuana    Comment: denies 06/28/14    Home Medications Prior to Admission medications   Medication Sig Start Date End Date Taking? Authorizing Provider  amiodarone (PACERONE) 200 MG tablet TAKE 1 TABLET(200 MG) BY MOUTH DAILY 06/01/20   Fay Records, MD  aspirin EC 81 MG tablet Take 81 mg by mouth daily.    [provider]  atorvastatin (LIPITOR) 80 MG tablet Take 1 tablet (80 mg total) by mouth daily. 06/01/20   Fay Records, MD  carvedilol (COREG) 12.5 MG tablet Take 1 tablet  by mouth twice daily with a meal. Patient overdue for an appointment and must call and schedule for further refills1st attempt 05/24/20   Fay Records, MD  clopidogrel (PLAVIX) 75 MG tablet Take 1 tablet (75 mg total) by mouth daily. 06/01/20   Fay Records, MD  furosemide (LASIX) 40 MG tablet Take 1 tablet (40 mg total) by mouth daily. 06/01/20   Fay Records, MD  metFORMIN (GLUCOPHAGE) 1000 MG tablet Take 1,000 mg by mouth two times a day 07/30/17   [provider]  sacubitril-valsartan (ENTRESTO) 24-26 MG Take 1 tablet by mouth 2 (two) times daily. 05/24/20   Isaiah Serge, NP    Allergies    Other  Review of Systems   Review of Systems  Respiratory: Negative for shortness of breath.   Cardiovascular: Negative for chest pain.  Musculoskeletal: Positive for arthralgias and myalgias.  Neurological: Negative for weakness and numbness.    Physical Exam Updated Vital Signs BP (!) 156/117 (BP Location: Right Arm)   Pulse 79   Temp 98.2 F (36.8 C) (Oral)   Resp 16   Ht 6' (1.829 m)   Wt 86.2 kg   SpO2 96%   BMI 25.77 kg/m   Physical Exam Vitals and nursing note reviewed.  Constitutional:      General: He is not in acute distress.    Appearance: Normal appearance. He is well-developed. He is not ill-appearing.  HENT:     Head: Normocephalic and atraumatic.  Eyes:     General: No scleral icterus.       Right eye: No discharge.        Left eye: No discharge.     Conjunctiva/sclera: Conjunctivae normal.     Pupils: Pupils are equal, round, and reactive to light.  Cardiovascular:     Rate and Rhythm: Normal rate and regular rhythm.  Pulmonary:     Effort: Pulmonary effort is normal. No respiratory distress.     Breath sounds: Normal breath sounds.  Abdominal:     General: There is no distension.  Musculoskeletal:     Cervical back: Normal range of motion.     Comments: Right shoulder: No obvious swelling, deformity, or warmth. Mild tenderness to palpation over the  posterior shoulder. FROM. 5/5 strength. Cap refill <2. N/V intact.   Skin:    General: Skin is warm and dry.  Neurological:     Mental Status: He is alert and oriented to person, place, and time.  Psychiatric:        Behavior: Behavior normal.     ED Results / Procedures / Treatments   Labs (all labs ordered are listed, but only abnormal results are displayed) Labs Reviewed - No data to display  EKG None  Radiology DG Shoulder Right  Result Date: 06/06/2020 CLINICAL DATA:  Post fall from bed with persistent right shoulder pain. EXAM: RIGHT SHOULDER - 2+ VIEW COMPARISON:  None. FINDINGS: No fracture or dislocation. Glenohumeral joint spaces appear preserved. Mild degenerative change the right AC joint with joint space loss and inferiorly directed osteophytosis. No evidence of calcific tendinitis. Limited visualization adjacent thorax demonstrates sequela of prior median sternotomy and CABG. Regional soft tissues appear normal. IMPRESSION: 1. Minimal degenerative change the right AC joint. 2. Otherwise, no explanation for patient's persistent right shoulder pain. Electronically Signed   By: Simonne Come M.D.   On: 06/06/2020 07:48    Procedures Procedures (including critical care time)  Medications Ordered in ED Medications - No data to display  ED Course  I have reviewed the triage vital signs and the nursing notes.  Pertinent labs & imaging results that were available during my care of the patient were reviewed by me and considered in my medical decision making (see chart for details).  55 year old RHD male presents with persistent R shoulder pain after an injury a month ago. On exam he has mild tenderness over the posterior shoulder with normal ROM. Xray obtained shows mild AC joint arthritis. He has tried conservative management without relief. Will place in a sling and try Voltaren gel. He was advised to f/u with orthopedics if he continues to have shoulder pain.  MDM  Rules/Calculators/A&P                       Final Clinical Impression(s) /  ED Diagnoses Final diagnoses:  Fall, initial encounter  Acute pain of right shoulder    Rx / DC Orders ED Discharge Orders    None       Bethel Born, PA-C 06/06/20 [id number]    Derwood Kaplan, MD 06/06/20 [phone]

## 2020-06-06 NOTE — Discharge Instructions (Signed)
Wear sling at all times while out of bed. You can remove for showering and sleeping.  Do gentle range of motion exercises daily to prevent stiffness Apply heat to the shoulder several times a day to warm up the muscles Apply Voltaren gel to more painful area on the shoulder up to 4 times daily for pain and inflammation Please follow up with Orthopedics if you continue to have pain

## 2020-06-07 ENCOUNTER — Ambulatory Visit: Payer: Medicaid Other | Admitting: Cardiology

## 2020-06-30 NOTE — Progress Notes (Signed)
Cardiology Office Note   Date:  07/11/2020   ID:  Alan Diaz, DOB 1965-02-15, MRN [id number]  PCP:  Patient, No Pcp Per  Cardiologist: Dr. Tenny Craw, MD   Chief Complaint  Patient presents with  . Follow-up    History of Present Illness: Alan Diaz is a 55 y.o. male who presents for 2 week f/u, seen for Dr. Tenny Craw.   Alan Diaz has a history of CAD, ischemic cardiomyopathy, chronic combined systolic and diastolic CHF, DM 2, hypertension, hyperlipidemia and tobacco use.  He underwent DES/PCI to pLAD and dRCA and 05/2015. NSTEMI 03/2017 followed by coronary artery bypass revascularization with LIMA-LAD, SVG-OM, SVG-PDA.  He had postoperative NSVT treated with amiodarone.  Follow-up echocardiogram showed severe LV dysfunction with an EF of 25 to 30%.  He was then referred to EP, Dr. Johney Frame and ICD implantation was recommended however the patient deferred.  09/2017 he had a hospital admission for decompensated heart failure and was placed on Entresto.  There was a lapse in cardiology follow-up until 03/16/2019 after running out of Lasix and Entresto.  At that point, he was doing well from a CV standpoint.  [name] was restarted and he was again lost to follow-up.  He was then seen by Nada Boozer NP 05/24/2020.  He reported a difficult year as his sister passed away of COVID-19 and he was not doing what he needs to do to take care of himself.  Largest complaint was shortness of breath and DOE felt likely to be secondary to medication noncompliance.  He was started on 80 mg x 2 days then resume 40 mg daily with close follow-up.   Regarding his cardiomyopathy, he was continued on carvedilol and once again started on Entresto 24/26>> given samples with plans for close follow-up for up titration.   Once again has deferred ICD placement therefore amiodarone was continued.   Did have complaints of hemoptysis therefore CXR was performed which showed here airspace opacities in the left  lung, most evident in the left lower lobe, consistent with pneumonia in the proper clinical setting.  Alan Diaz reports that his symptoms have improved since last office visit.  He denies shortness of breath, PND, LE edema or anginal symptoms.  He has had no further hemoptysis since last being seen.  BP side today at 100/60.  He does not check this at home.  Denies dizziness or syncope.  Discussed drawing lab work today and if creatinine stable we will plan to reduce carvedilol and Entresto however if creatinine worse, will plan to keep medications the same.  Patient is in agreement.     Past Medical History:  Diagnosis Date  . CAD (coronary artery disease) 2016   a. NSTEMI 5/16 s/p DES to pLAD and dRCA // b. NSTEMI 4/18 >> 95% LM, oLAD stent ok, oLCx 80, mRCA stent ok, EF < 25 >> IABP // c. s/p CABG with Dr. Zenaida Niece Trig (L-LAD, S-OM, S-PDA)  . CHF (congestive heart failure) (HCC)   . Chronic combined systolic and diastolic CHF (congestive heart failure) (HCC) 05/03/2015  . CKD (chronic kidney disease) stage 3, GFR 30-59 ml/min 04/23/2017  . Diabetes mellitus   . GERD (gastroesophageal reflux disease)   . HLD (hyperlipidemia)   . Hypertension   . Ischemic cardiomyopathy    a. 2D ECHO 05/02/15 with EF 25-30%, mild LV dilation. Mild LVH, mod HKof the mid-apicalanteroseptal myocardium, G1DD // b. Mild LVH, EF 25, diffuse HK, grade 1 diastolic dysfunction, trivial MR //  Echo 7/18: Mild concentric LVH, EF 25-30, inferior and inferolateral AK, grade 2 diastolic dysfunction, trivial MR/PI   . Left main coronary artery thrombosis (HCC)   . NSTEMI (non-ST elevated myocardial infarction) (HCC) 05/05/2015  . Tobacco abuse   . Type 2 diabetes mellitus with circulatory disorder (HCC) 10/31/2008   Qualifier: Diagnosis of  By: Daphine Deutscher FNP, Zena Amos      Past Surgical History:  Procedure Laterality Date  . CARDIAC CATHETERIZATION N/A 05/03/2015   Procedure: Right/Left Heart Cath and Coronary Angiography;   Surgeon: Peter M Swaziland, MD;  Location: [facility] INVASIVE CV LAB CUPID;  Service: Cardiovascular;  Laterality: N/A;  . CARDIAC CATHETERIZATION N/A 05/03/2015   Procedure: Intravascular Ultrasound/IVUS;  Surgeon: Peter M Swaziland, MD;  Location: [facility] INVASIVE CV LAB CUPID;  Service: Cardiovascular;  Laterality: N/A;  . CARDIAC CATHETERIZATION N/A 05/03/2015   Procedure: Coronary Stent Intervention;  Surgeon: Peter M Swaziland, MD;  Location: [facility] INVASIVE CV LAB CUPID;  Service: Cardiovascular;  Laterality: N/A;  rca  . CORONARY ARTERY BYPASS GRAFT N/A 04/24/2017   Procedure: CORONARY ARTERY BYPASS GRAFTING (CABG) times three using left intenal mammary artery and right saphenous vein;  Surgeon: Kerin Perna, MD;  Location: [facility] OR;  Service: Open Heart Surgery;  Laterality: N/A;  . IABP INSERTION N/A 04/24/2017   Procedure: IABP Insertion;  Surgeon: Marykay Lex, MD;  Location: [facility] INVASIVE CV LAB;  Service: Cardiovascular;  Laterality: N/A;  . LEFT HEART CATH AND CORONARY ANGIOGRAPHY N/A 04/24/2017   Procedure: Left Heart Cath and Coronary Angiography;  Surgeon: Marykay Lex, MD;  Location: [facility] INVASIVE CV LAB;  Service: Cardiovascular;  Laterality: N/A;  . PERCUTANEOUS CORONARY STENT INTERVENTION (PCI-S)  05/03/2015   Procedure: Percutaneous Coronary Stent Intervention (Pci-S);  Surgeon: Peter M Swaziland, MD;  Location: [facility] INVASIVE CV LAB CUPID;  Service: Cardiovascular;;  Prox LAD  . TEE WITHOUT CARDIOVERSION N/A 04/24/2017   Procedure: TRANSESOPHAGEAL ECHOCARDIOGRAM (TEE);  Surgeon: Kerin Perna, MD;  Location: [facility] OR;  Service: Open Heart Surgery;  Laterality: N/A;  . WISDOM TOOTH EXTRACTION     bottom LT, top RT     Current Outpatient Medications  Medication Sig Dispense Refill  . amiodarone (PACERONE) 200 MG tablet TAKE 1 TABLET(200 MG) BY MOUTH DAILY 30 tablet 11  . aspirin EC 81 MG tablet Take 81 mg by mouth daily.    Marland Kitchen atorvastatin (LIPITOR) 80 MG tablet Take 1 tablet (80 mg total) by mouth daily. 90  tablet 3  . carvedilol (COREG) 12.5 MG tablet Take 1 tablet by mouth twice daily with a meal. Patient overdue for an appointment and must call and schedule for further refills1st attempt 180 tablet 3  . clopidogrel (PLAVIX) 75 MG tablet Take 1 tablet (75 mg total) by mouth daily. 90 tablet 3  . diclofenac Sodium (VOLTAREN) 1 % GEL Apply 4 g topically in the morning and at bedtime.    . furosemide (LASIX) 40 MG tablet Take 1 tablet (40 mg total) by mouth daily. 90 tablet 3  . metFORMIN (GLUCOPHAGE) 1000 MG tablet Take 1,000 mg by mouth two times a day  1  . sacubitril-valsartan (ENTRESTO) 24-26 MG Take 1 tablet by mouth 2 (two) times daily. 180 tablet 3   No current facility-administered medications for this visit.    Allergies:   Other    Social History:  The patient  reports that he has been smoking cigarettes. He has been smoking about 0.10 packs per day. He has never used  smokeless tobacco. He reports that he does not drink alcohol and does not use drugs.   Family History:  The patient's family history includes Breast cancer in his mother; Diabetes in his brother and sister; Heart disease in his father; Hypertension in his brother and sister.    ROS:  Please see the history of present illness.   Otherwise, review of systems are positive for none. All other systems are reviewed and negative.    PHYSICAL EXAM: VS:  BP 100/62   Pulse 80   Ht 6' (1.829 m)   Wt 187 lb 12.8 oz (85.2 kg)   SpO2 97%   BMI 25.47 kg/m  , BMI Body mass index is 25.47 kg/m.   General: Well developed, well nourished, NAD Neck: Negative for carotid bruits. No JVD Lungs:Clear to ausculation bilaterally. No wheezes, rales, or rhonchi. Breathing is unlabored. Cardiovascular: RRR with S1 S2. No murmurs Extremities: No edema. No clubbing or cyanosis. Radial pulses 2+ bilaterally Neuro: Alert and oriented. No focal deficits. No facial asymmetry. MAE spontaneously. Psych: Responds to questions appropriately with  normal affect.    EKG:  EKG is not ordered today.  Recent Labs: 05/24/2020: ALT 8; BUN 9; Creatinine, Ser 1.41; Potassium 3.9; Sodium 137   Lipid Panel    Component Value Date/Time   CHOL 174 05/24/2020 1106   TRIG 145 05/24/2020 1106   HDL 40 05/24/2020 1106   CHOLHDL 4.4 05/24/2020 1106   CHOLHDL 5.4 (H) 12/17/2016 1120   VLDL 26 12/17/2016 1120   LDLCALC 108 (H) 05/24/2020 1106    Wt Readings from Last 3 Encounters:  07/11/20 187 lb 12.8 oz (85.2 kg)  06/06/20 190 lb (86.2 kg)  05/24/20 193 lb (87.5 kg)     Other studies Reviewed: Additional studies/ records that were reviewed today include:  Review of the above records demonstrates:    Echocardiogram 09/13/17 Mild LVH, EF 25-30, diffuse HK, grade 2 diastolic dysfunction, trivial MR, mild TR, PASP 20  Echocardiogram 07/25/17 Mild concentric LVH, EF 25-30, severe diffuse HK, inferior and inferolateral akinesis, grade 2 diastolic dysfunction, trivial MR  Intraoperative TEE 04/24/17 1+ MR, 1+ TR, severe LV dysfunction  LHC 04/24/17 LM 95 LAD ostial stent patent, mid 40 LCx ostial 80 RCA proximal 40, mid stent patent EF <25 IABP placed  Echo 04/24/17 Mild LVH, EF 25, diffuse HK, grade 1 diastolic dysfunction, trivial MR  Echo 09/10/16 EF 35-40, diffuse HK, grade 2 diastolic dysfunction  04/25/17 CABG Emergency coronary artery bypass grafting x3 (left internal mammary artery to left anterior descending, saphenous vein graft to circumflex  ASSESSMENT AND PLAN:  1.  Acute on chronic systolic and diastolic CHF: -At last OV had complaints of increased shortness of breath and DOE likely secondary to medication noncompliance therefore he was started back on Lasix 80 mg x 2 days with resumption of 40 mg daily -Reports symptoms have subsided since last OV -We will keep medications the same for now and redraw lab work today.  If creatinine stable we will plan to reduce carvedilol and increase Entresto with a  5-month follow-up however if creatinine worse we will keep medications the same. -BP soft today at 100/60 with no symptoms -Appears euvolemic on exam  2.  Ischemic cardiomyopathy: -Last echocardiogram with LVEF at 25 to 30%>> initially referred to EP for possible ICD implantation however patient deferred -Has been on and off of Entresto several times per chart review>> mostly secondary to poor follow-up -Entresto 24/26 resumed at last OV with  plans to uptitrate if creatinine and BP stable -Creatinine, 1.41>> redraw today with plan as above -Continue carvedilol -Once on GDMT will need repeat echocardiogram  3.  History of NSVT on amiodarone: -Known severe LV dysfunction with refusal of ICD placement in the past -Continue amiodarone -LFTs, WNL -Tolerating well  4.  CKD stage III: -Creatinine, 1.41 on 05/24/2020 -Potassium, 3.9  5.  Tobacco use: -Rare tobacco use  6.  Hemoptysis: -CXR performed at last OV -There are airspace opacities in the left lung, most evident in the left lower lobe, consistent with pneumonia in the proper clinical setting.  7.  HLD: -LDL, 108 on 05/24/2020 -LDL goal <70   Current medicines are reviewed at length with the patient today.  The patient does not have concerns regarding medicines.  The following changes have been made:  no change  Labs/ tests ordered today include: BMET No orders of the defined types were placed in this encounter.   Disposition:   FU with Dr. Tenny Craw or APP in 2 months  Signed, Georgie Chard, NP  07/11/2020 8:42 AM    [facility] [address] [place], [place], [place]  [address] Phone: [phone]; Fax: [phone]

## 2020-07-11 ENCOUNTER — Encounter: Payer: Self-pay | Admitting: Cardiology

## 2020-07-11 ENCOUNTER — Ambulatory Visit (INDEPENDENT_AMBULATORY_CARE_PROVIDER_SITE_OTHER): Payer: Medicaid Other | Admitting: Cardiology

## 2020-07-11 ENCOUNTER — Other Ambulatory Visit: Payer: Self-pay

## 2020-07-11 VITALS — BP 100/62 | HR 80 | Ht 72.0 in | Wt 187.8 lb

## 2020-07-11 DIAGNOSIS — I255 Ischemic cardiomyopathy: Secondary | ICD-10-CM

## 2020-07-11 DIAGNOSIS — I5043 Acute on chronic combined systolic (congestive) and diastolic (congestive) heart failure: Secondary | ICD-10-CM | POA: Diagnosis not present

## 2020-07-11 DIAGNOSIS — R0602 Shortness of breath: Secondary | ICD-10-CM | POA: Diagnosis not present

## 2020-07-11 DIAGNOSIS — I10 Essential (primary) hypertension: Secondary | ICD-10-CM

## 2020-07-11 DIAGNOSIS — E7800 Pure hypercholesterolemia, unspecified: Secondary | ICD-10-CM

## 2020-07-11 DIAGNOSIS — N1831 Chronic kidney disease, stage 3a: Secondary | ICD-10-CM | POA: Diagnosis not present

## 2020-07-11 LAB — BASIC METABOLIC PANEL
BUN/Creatinine Ratio: 9 (ref 9–20)
BUN: 13 mg/dL (ref 6–24)
CO2: 25 mmol/L (ref 20–29)
Calcium: 8.9 mg/dL (ref 8.7–10.2)
Chloride: 96 mmol/L (ref 96–106)
Creatinine, Ser: 1.51 mg/dL — ABNORMAL HIGH (ref 0.76–1.27)
GFR calc Af Amer: 59 mL/min/{1.73_m2} — ABNORMAL LOW (ref >59)
GFR calc non Af Amer: 51 mL/min/{1.73_m2} — ABNORMAL LOW (ref >59)
Glucose: 379 mg/dL — ABNORMAL HIGH (ref 65–99)
Potassium: 3.6 mmol/L (ref 3.5–5.2)
Sodium: 136 mmol/L (ref 134–144)

## 2020-07-11 NOTE — Addendum Note (Signed)
Addended by: Michaelle Copas on: 07/11/2020 08:48 AM   Modules accepted: Orders

## 2020-07-11 NOTE — Patient Instructions (Signed)
Medication Instructions:  Your physician recommends that you continue on your current medications as directed. Please refer to the Current Medication list given to you today.  *If you need a refill on your cardiac medications before your next appointment, please call your pharmacy*   Lab Work: TODAY: BMET If you have labs (blood work) drawn today and your tests are completely normal, you will receive your results only by: Marland Kitchen MyChart Message (if you have MyChart) OR . A paper copy in the mail If you have any lab test that is abnormal or we need to change your treatment, we will call you to review the results.   Testing/Procedures: NONE   Follow-Up: At [facility], you and your health needs are our priority.  As part of our continuing mission to provide you with exceptional heart care, we have created designated Provider Care Teams.  These Care Teams include your primary Cardiologist (physician) and Advanced Practice Providers (APPs -  Physician Assistants and Nurse Practitioners) who all work together to provide you with the care you need, when you need it.  We recommend signing up for the patient portal called "MyChart".  Sign up information is provided on this After Visit Summary.  MyChart is used to connect with patients for Virtual Visits (Telemedicine).  Patients are able to view lab/test results, encounter notes, upcoming appointments, etc.  Non-urgent messages can be sent to your provider as well.   To learn more about what you can do with MyChart, go to [email or web address].    Your next appointment:   2 month(s)  The format for your next appointment:   In Person  Provider:   You may see Dietrich Pates, MD or one of the following Advanced Practice Providers on your designated Care Team:    Tereso Newcomer, PA-C  [place], [place]

## 2020-07-18 ENCOUNTER — Telehealth: Payer: Self-pay | Admitting: Internal Medicine

## 2020-07-18 NOTE — Telephone Encounter (Signed)
Patient's wife calling for lab results. 

## 2020-07-18 NOTE — Telephone Encounter (Addendum)
Alan Schilder, NP  07/14/2020 3:04 PM EDT     Please let the patient know that his labs are stable    The patients wife Joyce Gross (on [place]) has been notified of the result and verbalized understanding.  All questions (if any) were answered. Loa Socks, LPN [phone]/[id number] 4:92 PM

## 2020-09-11 NOTE — Progress Notes (Deleted)
Cardiology Office Note   Date:  09/11/2020   ID:  Alan Diaz, DOB [birth year], MRN [id number]  PCP:  Patient, No Pcp Per  Cardiologist:   Dietrich Pates, MD       History of Present Illness: Alan Diaz is a 55 y.o. male with a history ofof CAD, ischemic cardiomyopathy, chronic combined systolic and diastolic CHF, DM 2, hypertension, hyperlipidemia and tobacco use.  He underwent DES/PCI topLADanddRCAand 05/2015. NSTEMI 03/2017 followed by coronary artery bypass revascularization withLIMA-LAD, SVG-OM, SVG-PDA.He had postoperative NSVT treated with amiodarone. Follow-up echocardiogram showed severe LV dysfunction with an EF of 25 to 30%. He was then referred to EP, Dr. Johney Frame and ICD implantation was recommended however the patient deferred.  09/2017 he had a hospital admission for decompensated heart failure and was placed on Entresto. There was a lapse in cardiology follow-up until 03/16/2019 after running out of Lasix and Entresto. At that point, he was doing well from a CV standpoint. [name] was restarted and he was again lost to follow-up.  He was then seen by Vernona Rieger IngoldNP5/25/2021. He reported a difficult year as his sister passed away of COVID-19 and he was not doing what he needs to do to take care of himself. Largest complaint was shortness of breath and DOE felt likely to be secondary to medication noncompliance. He was started on 80 mg x2 days then resume 40 mg daily with close follow-up.  Regarding his cardiomyopathy, he was continued on carvedilol and once again started on Entresto 24/26>>given samples with plans for close follow-up for up titration.  Once again has deferred ICD placement therefore amiodarone was continued.  Did have complaints of hemoptysis therefore CXR was performed which showedhere airspace opacities in the left lung, most evident in the left lower lobe, consistent with pneumonia in the proper clinical setting.  Alan Diaz reports that his symptoms have improved since last office visit.  He denies shortness of breath, PND, LE edema or anginal symptoms.  He has had no further hemoptysis since last being seen.  BP side today at 100/60.  He does not check this at home.  Denies dizziness or syncope.  Discussed drawing lab work today and if creatinine stable we will plan to reduce carvedilol and Entresto however if creatinine worse, will plan to keep medications the same.  Patient is in agreement.   The pt was seen by Arelia Longest in the spring 2021      No outpatient medications have been marked as taking for the 09/12/20 encounter (Appointment) with Pricilla Riffle, MD.     Allergies:   Other   Past Medical History:  Diagnosis Date  . CAD (coronary artery disease) 2016   a. NSTEMI 5/16 s/p DES to pLAD and dRCA // b. NSTEMI 4/18 >> 95% LM, oLAD stent ok, oLCx 80, mRCA stent ok, EF < 25 >> IABP // c. s/p CABG with Dr. Zenaida Niece Trig (L-LAD, S-OM, S-PDA)  . CHF (congestive heart failure) (HCC)   . Chronic combined systolic and diastolic CHF (congestive heart failure) (HCC) 05/03/2015  . CKD (chronic kidney disease) stage 3, GFR 30-59 ml/min 04/23/2017  . Diabetes mellitus   . GERD (gastroesophageal reflux disease)   . HLD (hyperlipidemia)   . Hypertension   . Ischemic cardiomyopathy    a. 2D ECHO 05/02/15 with EF 25-30%, mild LV dilation. Mild LVH, mod HKof the mid-apicalanteroseptal myocardium, G1DD // b. Mild LVH, EF 25, diffuse HK, grade 1 diastolic dysfunction, trivial MR // Echo 7/18: Mild  concentric LVH, EF 25-30, inferior and inferolateral AK, grade 2 diastolic dysfunction, trivial MR/PI   . Left main coronary artery thrombosis (HCC)   . NSTEMI (non-ST elevated myocardial infarction) (HCC) 05/05/2015  . Tobacco abuse   . Type 2 diabetes mellitus with circulatory disorder (HCC) 10/31/2008   Qualifier: Diagnosis of  By: Daphine Deutscher FNP, Zena Amos      Past Surgical History:  Procedure Laterality Date  . CARDIAC  CATHETERIZATION N/A 05/03/2015   Procedure: Right/Left Heart Cath and Coronary Angiography;  Surgeon: Peter M Swaziland, MD;  [facility];  Service: Cardiovascular;  Laterality: N/A;  . CARDIAC CATHETERIZATION N/A 05/03/2015   Procedure: Intravascular Ultrasound/IVUS;  Surgeon: Peter M Swaziland, MD;  [facility];  Service: Cardiovascular;  Laterality: N/A;  . CARDIAC CATHETERIZATION N/A 05/03/2015   Procedure: Coronary Stent Intervention;  Surgeon: Peter M Swaziland, MD;  [facility]: [facility];  Service: Cardiovascular;  Laterality: N/A;  rca  . CORONARY ARTERY BYPASS GRAFT N/A 04/24/2017   Procedure: CORONARY ARTERY BYPASS GRAFTING (CABG) times three using left intenal mammary artery and right saphenous vein;  Surgeon: Kerin Perna, MD;  Location: [facility] OR;  Service: Open Heart Surgery;  Laterality: N/A;  . IABP INSERTION N/A 04/24/2017   Procedure: IABP Insertion;  Surgeon: Marykay Lex, MD;  Location: [facility] INVASIVE CV LAB;  Service: Cardiovascular;  Laterality: N/A;  . LEFT HEART CATH AND CORONARY ANGIOGRAPHY N/A 04/24/2017   Procedure: Left Heart Cath and Coronary Angiography;  Surgeon: Marykay Lex, MD;  Location[facility] INVASIVE CV LAB;  Service: Cardiovascular;  Laterality: N/A;  . PERCUTANEOUS CORONARY STENT INTERVENTION (PCI-S)  05/03/2015   Procedure: Percutaneous Coronary Stent Intervention (Pci-S);  Surgeon: Peter M Swaziland, MD;  Location: [facility] INVASIVE CV LAB CUPID;  Service: Cardiovascular;;  Prox LAD  . TEE WITHOUT CARDIOVERSION N/A 04/24/2017   Procedure: TRANSESOPHAGEAL ECHOCARDIOGRAM (TEE);  Surgeon: Kerin Perna, MD;  Location: [facility] OR;  Service: Open Heart Surgery;  Laterality: N/A;  . WISDOM TOOTH EXTRACTION     bottom LT, top RT     Social History:  The patient  reports that he has been smoking cigarettes. He has been smoking about 0.10 packs per day. He has never used smokeless tobacco. He reports that he does not drink alcohol and does  not use drugs.   Family History:  The patient's family history includes Breast cancer in his mother; Diabetes in his brother and sister; Heart disease in his father; Hypertension in his brother and sister.    ROS:  Please see the history of present illness. All other systems are reviewed and  Negative to the above problem except as noted.    PHYSICAL EXAM: VS:  There were no vitals taken for this visit.  GEN: Well nourished, well developed, in no acute distress  HEENT: normal  Neck: no JVD, carotid bruits, or masses Cardiac: RRR; no murmurs, rubs, or gallops,no edema  Respiratory:  clear to auscultation bilaterally, normal work of breathing GI: soft, nontender, nondistended, + BS  No hepatomegaly  MS: no deformity Moving all extremities   Skin: warm and dry, no rash Neuro:  Strength and sensation are intact Psych: euthymic mood, full affect   EKG:  EKG is ordered today.   Lipid Panel    Component Value Date/Time   CHOL 174 05/24/2020 1106   TRIG 145 05/24/2020 1106   HDL 40 05/24/2020 1106   CHOLHDL 4.4 05/24/2020 1106  CHOLHDL 5.4 (H) 12/17/2016 1120   VLDL 26 12/17/2016 1120   LDLCALC 108 (H) 05/24/2020 1106      Wt Readings from Last 3 Encounters:  07/11/20 187 lb 12.8 oz (85.2 kg)  06/06/20 190 lb (86.2 kg)  05/24/20 193 lb (87.5 kg)      ASSESSMENT AND PLAN:     Current medicines are reviewed at length with the patient today.  The patient does not have concerns regarding medicines.  Signed, Dietrich Pates, MD  09/11/2020 9:08 PM    [facility] [address] [place], [place], [place]  [address] Phone: [phone]; Fax: [phone]

## 2020-09-12 ENCOUNTER — Ambulatory Visit: Payer: Medicaid Other | Admitting: Internal Medicine

## 2020-09-15 ENCOUNTER — Telehealth: Payer: Self-pay | Admitting: *Deleted

## 2020-09-15 DIAGNOSIS — R0602 Shortness of breath: Secondary | ICD-10-CM

## 2020-09-15 NOTE — Telephone Encounter (Signed)
-----   Message from Leone Brand, NP sent at 09/15/2020  9:15 AM EDT ----- Could we repeat his 2 V CXR this week or next to see if cleared?  Thanks.

## 2020-09-15 NOTE — Telephone Encounter (Signed)
Left a message for pt to call back

## 2020-10-07 ENCOUNTER — Telehealth: Payer: Self-pay | Admitting: Internal Medicine

## 2020-10-07 ENCOUNTER — Ambulatory Visit
Admission: RE | Admit: 2020-10-07 | Discharge: 2020-10-07 | Disposition: A | Discharge status: Home / Self Care | Payer: Medicaid Other | Source: Ambulatory Visit | Attending: Cardiology | Admitting: Cardiology

## 2020-10-07 DIAGNOSIS — R0602 Shortness of breath: Secondary | ICD-10-CM

## 2020-10-07 NOTE — Telephone Encounter (Signed)
I do not see any CT results. I am thinking pt may be calling about CXR that was done today.

## 2020-10-07 NOTE — Telephone Encounter (Signed)
Returned call.  Left a message to call back.

## 2020-10-07 NOTE — Telephone Encounter (Signed)
Patient's fiance calling for CT results.

## 2020-10-10 NOTE — Telephone Encounter (Signed)
Returned the call to [employer], pt's spouse, DPR on file.  She was looking for results to the Chest X-Ray pt had on Friday.  She was advised that as soon as it was resulted, someone will call back with them.  She was grateful for the call back and verbalized understanding.

## 2020-10-10 NOTE — Telephone Encounter (Signed)
Follow Up:       Alan Diaz is retuning Alan Diaz's call from Friday, concerning her results.

## 2020-10-11 NOTE — Telephone Encounter (Addendum)
Received a message from CMA in chart prep that patient called and is waiting for a call about CXR results.  The CXR is in ordering provider basket for review.  Will forward to provider and covering CMA for follow up.

## 2020-10-14 ENCOUNTER — Telehealth: Payer: Self-pay | Admitting: Internal Medicine

## 2020-10-14 DIAGNOSIS — R9389 Abnormal findings on diagnostic imaging of other specified body structures: Secondary | ICD-10-CM

## 2020-10-14 NOTE — Telephone Encounter (Signed)
-----   Message from Leone Brand, NP sent at 10/11/2020  5:06 PM EDT ----- Non contrast chest CT to eval loculated pleural fluid.

## 2020-10-14 NOTE — Telephone Encounter (Signed)
Patient calling to receive xray resulfs

## 2020-10-18 ENCOUNTER — Encounter: Payer: Self-pay | Admitting: Internal Medicine

## 2020-10-27 ENCOUNTER — Inpatient Hospital Stay (HOSPITAL_COMMUNITY): Payer: Medicaid Other

## 2020-10-27 ENCOUNTER — Emergency Department (HOSPITAL_COMMUNITY): Payer: Medicaid Other

## 2020-10-27 ENCOUNTER — Other Ambulatory Visit: Payer: Self-pay

## 2020-10-27 ENCOUNTER — Encounter (HOSPITAL_COMMUNITY): Payer: Self-pay | Admitting: Emergency Medicine

## 2020-10-27 ENCOUNTER — Inpatient Hospital Stay (HOSPITAL_COMMUNITY)
Admission: EM | Admit: 2020-10-27 | Discharge: 2020-10-28 | DRG: 064 | Disposition: A | Discharge status: Home / Self Care | Payer: Medicaid Other | Attending: Internal Medicine | Admitting: Internal Medicine

## 2020-10-27 DIAGNOSIS — Z9119 Patient's noncompliance with other medical treatment and regimen: Secondary | ICD-10-CM

## 2020-10-27 DIAGNOSIS — R569 Unspecified convulsions: Secondary | ICD-10-CM | POA: Diagnosis present

## 2020-10-27 DIAGNOSIS — G928 Other toxic encephalopathy: Secondary | ICD-10-CM | POA: Diagnosis present

## 2020-10-27 DIAGNOSIS — N179 Acute kidney failure, unspecified: Secondary | ICD-10-CM | POA: Diagnosis present

## 2020-10-27 DIAGNOSIS — I5042 Chronic combined systolic (congestive) and diastolic (congestive) heart failure: Secondary | ICD-10-CM | POA: Diagnosis present

## 2020-10-27 DIAGNOSIS — I2510 Atherosclerotic heart disease of native coronary artery without angina pectoris: Secondary | ICD-10-CM | POA: Diagnosis present

## 2020-10-27 DIAGNOSIS — I428 Other cardiomyopathies: Secondary | ICD-10-CM | POA: Diagnosis present

## 2020-10-27 DIAGNOSIS — E876 Hypokalemia: Secondary | ICD-10-CM | POA: Diagnosis present

## 2020-10-27 DIAGNOSIS — N289 Disorder of kidney and ureter, unspecified: Secondary | ICD-10-CM

## 2020-10-27 DIAGNOSIS — I611 Nontraumatic intracerebral hemorrhage in hemisphere, cortical: Secondary | ICD-10-CM | POA: Diagnosis present

## 2020-10-27 DIAGNOSIS — W19XXXA Unspecified fall, initial encounter: Secondary | ICD-10-CM | POA: Diagnosis not present

## 2020-10-27 DIAGNOSIS — R400 Somnolence: Secondary | ICD-10-CM | POA: Diagnosis not present

## 2020-10-27 DIAGNOSIS — I252 Old myocardial infarction: Secondary | ICD-10-CM | POA: Diagnosis not present

## 2020-10-27 DIAGNOSIS — E1100 Type 2 diabetes mellitus with hyperosmolarity without nonketotic hyperglycemic-hyperosmolar coma (NKHHC): Secondary | ICD-10-CM | POA: Diagnosis present

## 2020-10-27 DIAGNOSIS — Z7902 Long term (current) use of antithrombotics/antiplatelets: Secondary | ICD-10-CM

## 2020-10-27 DIAGNOSIS — E1165 Type 2 diabetes mellitus with hyperglycemia: Secondary | ICD-10-CM | POA: Diagnosis present

## 2020-10-27 DIAGNOSIS — S0232XA Fracture of orbital floor, left side, initial encounter for closed fracture: Secondary | ICD-10-CM | POA: Diagnosis present

## 2020-10-27 DIAGNOSIS — Z833 Family history of diabetes mellitus: Secondary | ICD-10-CM

## 2020-10-27 DIAGNOSIS — H5340 Unspecified visual field defects: Secondary | ICD-10-CM | POA: Diagnosis present

## 2020-10-27 DIAGNOSIS — N1831 Chronic kidney disease, stage 3a: Secondary | ICD-10-CM | POA: Diagnosis present

## 2020-10-27 DIAGNOSIS — I6389 Other cerebral infarction: Secondary | ICD-10-CM | POA: Diagnosis not present

## 2020-10-27 DIAGNOSIS — Z79899 Other long term (current) drug therapy: Secondary | ICD-10-CM

## 2020-10-27 DIAGNOSIS — I7774 Dissection of vertebral artery: Secondary | ICD-10-CM | POA: Diagnosis present

## 2020-10-27 DIAGNOSIS — Z955 Presence of coronary angioplasty implant and graft: Secondary | ICD-10-CM

## 2020-10-27 DIAGNOSIS — E1110 Type 2 diabetes mellitus with ketoacidosis without coma: Secondary | ICD-10-CM | POA: Diagnosis not present

## 2020-10-27 DIAGNOSIS — R9431 Abnormal electrocardiogram [ECG] [EKG]: Secondary | ICD-10-CM

## 2020-10-27 DIAGNOSIS — I639 Cerebral infarction, unspecified: Principal | ICD-10-CM | POA: Diagnosis present

## 2020-10-27 DIAGNOSIS — R29716 NIHSS score 16: Secondary | ICD-10-CM | POA: Diagnosis present

## 2020-10-27 DIAGNOSIS — I130 Hypertensive heart and chronic kidney disease with heart failure and stage 1 through stage 4 chronic kidney disease, or unspecified chronic kidney disease: Secondary | ICD-10-CM | POA: Diagnosis present

## 2020-10-27 DIAGNOSIS — W01198A Fall on same level from slipping, tripping and stumbling with subsequent striking against other object, initial encounter: Secondary | ICD-10-CM | POA: Diagnosis present

## 2020-10-27 DIAGNOSIS — I255 Ischemic cardiomyopathy: Secondary | ICD-10-CM | POA: Diagnosis present

## 2020-10-27 DIAGNOSIS — Z7984 Long term (current) use of oral hypoglycemic drugs: Secondary | ICD-10-CM

## 2020-10-27 DIAGNOSIS — Z8249 Family history of ischemic heart disease and other diseases of the circulatory system: Secondary | ICD-10-CM

## 2020-10-27 DIAGNOSIS — R4781 Slurred speech: Secondary | ICD-10-CM | POA: Diagnosis present

## 2020-10-27 DIAGNOSIS — G8194 Hemiplegia, unspecified affecting left nondominant side: Secondary | ICD-10-CM | POA: Diagnosis present

## 2020-10-27 DIAGNOSIS — G40901 Epilepsy, unspecified, not intractable, with status epilepticus: Secondary | ICD-10-CM | POA: Diagnosis present

## 2020-10-27 DIAGNOSIS — Z9114 Patient's other noncompliance with medication regimen: Secondary | ICD-10-CM

## 2020-10-27 DIAGNOSIS — R4182 Altered mental status, unspecified: Secondary | ICD-10-CM | POA: Diagnosis not present

## 2020-10-27 DIAGNOSIS — Z716 Tobacco abuse counseling: Secondary | ICD-10-CM

## 2020-10-27 DIAGNOSIS — R2990 Unspecified symptoms and signs involving the nervous system: Secondary | ICD-10-CM | POA: Diagnosis not present

## 2020-10-27 DIAGNOSIS — E1122 Type 2 diabetes mellitus with diabetic chronic kidney disease: Secondary | ICD-10-CM | POA: Diagnosis present

## 2020-10-27 DIAGNOSIS — F17210 Nicotine dependence, cigarettes, uncomplicated: Secondary | ICD-10-CM | POA: Diagnosis present

## 2020-10-27 DIAGNOSIS — S0990XA Unspecified injury of head, initial encounter: Secondary | ICD-10-CM

## 2020-10-27 DIAGNOSIS — E785 Hyperlipidemia, unspecified: Secondary | ICD-10-CM | POA: Diagnosis present

## 2020-10-27 DIAGNOSIS — Z951 Presence of aortocoronary bypass graft: Secondary | ICD-10-CM

## 2020-10-27 DIAGNOSIS — Z20822 Contact with and (suspected) exposure to covid-19: Secondary | ICD-10-CM | POA: Diagnosis present

## 2020-10-27 DIAGNOSIS — Z9109 Other allergy status, other than to drugs and biological substances: Secondary | ICD-10-CM

## 2020-10-27 DIAGNOSIS — Z7982 Long term (current) use of aspirin: Secondary | ICD-10-CM

## 2020-10-27 DIAGNOSIS — K219 Gastro-esophageal reflux disease without esophagitis: Secondary | ICD-10-CM | POA: Diagnosis present

## 2020-10-27 DIAGNOSIS — R29810 Facial weakness: Secondary | ICD-10-CM | POA: Diagnosis present

## 2020-10-27 LAB — I-STAT CHEM 8, ED
BUN: 23 mg/dL — ABNORMAL HIGH (ref 6–20)
Calcium, Ion: 1.1 mmol/L — ABNORMAL LOW (ref 1.15–1.40)
Chloride: 101 mmol/L (ref 98–111)
Creatinine, Ser: 1.7 mg/dL — ABNORMAL HIGH (ref 0.61–1.24)
Glucose, Bld: >700 mg/dL (ref 70–99)
HCT: 51 % (ref 39.0–52.0)
Hemoglobin: 17.3 g/dL — ABNORMAL HIGH (ref 13.0–17.0)
Potassium: 4.1 mmol/L (ref 3.5–5.1)
Sodium: 134 mmol/L — ABNORMAL LOW (ref 135–145)
TCO2: 12 mmol/L — ABNORMAL LOW (ref 22–32)

## 2020-10-27 LAB — CBC
HCT: 51 % (ref 39.0–52.0)
HCT: 43.6 % (ref 39.0–52.0)
Hemoglobin: 15.8 g/dL (ref 13.0–17.0)
Hemoglobin: 14.7 g/dL (ref 13.0–17.0)
MCH: 31.9 pg (ref 26.0–34.0)
MCH: 32.3 pg (ref 26.0–34.0)
MCHC: 31 g/dL (ref 30.0–36.0)
MCHC: 33.7 g/dL (ref 30.0–36.0)
MCV: 102.8 fL — ABNORMAL HIGH (ref 80.0–100.0)
MCV: 95.8 fL (ref 80.0–100.0)
Platelets: 276 10*3/uL (ref 150–400)
Platelets: 222 10*3/uL (ref 150–400)
RBC: 4.96 MIL/uL (ref 4.22–5.81)
RBC: 4.55 MIL/uL (ref 4.22–5.81)
RDW: 13 % (ref 11.5–15.5)
RDW: 13.1 % (ref 11.5–15.5)
WBC: 9.5 10*3/uL (ref 4.0–10.5)
WBC: 9 10*3/uL (ref 4.0–10.5)
nRBC: 0 % (ref 0.0–0.2)
nRBC: 0 % (ref 0.0–0.2)

## 2020-10-27 LAB — DIFFERENTIAL
Abs Immature Granulocytes: 0.05 10*3/uL (ref 0.00–0.07)
Basophils Absolute: 0.1 10*3/uL (ref 0.0–0.1)
Basophils Relative: 1 %
Eosinophils Absolute: 0.1 10*3/uL (ref 0.0–0.5)
Eosinophils Relative: 1 %
Immature Granulocytes: 1 %
Lymphocytes Relative: 41 %
Lymphs Abs: 3.9 10*3/uL (ref 0.7–4.0)
Monocytes Absolute: 0.8 10*3/uL (ref 0.1–1.0)
Monocytes Relative: 9 %
Neutro Abs: 4.6 10*3/uL (ref 1.7–7.7)
Neutrophils Relative %: 47 %

## 2020-10-27 LAB — LIPID PANEL
Cholesterol: 226 mg/dL — ABNORMAL HIGH (ref 0–200)
HDL: 43 mg/dL (ref >40)
LDL Cholesterol: 152 mg/dL — ABNORMAL HIGH (ref 0–99)
Total CHOL/HDL Ratio: 5.3 RATIO
Triglycerides: 156 mg/dL — ABNORMAL HIGH (ref <150)
VLDL: 31 mg/dL (ref 0–40)

## 2020-10-27 LAB — URINALYSIS, ROUTINE W REFLEX MICROSCOPIC
Bacteria, UA: NONE SEEN
Bilirubin Urine: NEGATIVE
Glucose, UA: >=500 mg/dL — AB
Hgb urine dipstick: NEGATIVE
Ketones, ur: NEGATIVE mg/dL
Leukocytes,Ua: NEGATIVE
Nitrite: NEGATIVE
Protein, ur: 100 mg/dL — AB
Specific Gravity, Urine: 1.028 (ref 1.005–1.030)
pH: 5 (ref 5.0–8.0)

## 2020-10-27 LAB — COMPREHENSIVE METABOLIC PANEL
ALT: 17 U/L (ref 0–44)
ALT: 12 U/L (ref 0–44)
AST: 22 U/L (ref 15–41)
AST: 12 U/L — ABNORMAL LOW (ref 15–41)
Albumin: 3.5 g/dL (ref 3.5–5.0)
Albumin: 3 g/dL — ABNORMAL LOW (ref 3.5–5.0)
Alkaline Phosphatase: 140 U/L — ABNORMAL HIGH (ref 38–126)
Alkaline Phosphatase: 103 U/L (ref 38–126)
Anion gap: 27 — ABNORMAL HIGH (ref 5–15)
Anion gap: 11 (ref 5–15)
BUN: 23 mg/dL — ABNORMAL HIGH (ref 6–20)
BUN: 21 mg/dL — ABNORMAL HIGH (ref 6–20)
CO2: 11 mmol/L — ABNORMAL LOW (ref 22–32)
CO2: 23 mmol/L (ref 22–32)
Calcium: 9.3 mg/dL (ref 8.9–10.3)
Calcium: 9.1 mg/dL (ref 8.9–10.3)
Chloride: 96 mmol/L — ABNORMAL LOW (ref 98–111)
Chloride: 103 mmol/L (ref 98–111)
Creatinine, Ser: 2.2 mg/dL — ABNORMAL HIGH (ref 0.61–1.24)
Creatinine, Ser: 1.58 mg/dL — ABNORMAL HIGH (ref 0.61–1.24)
GFR, Estimated: 34 mL/min — ABNORMAL LOW (ref >60)
GFR, Estimated: 51 mL/min — ABNORMAL LOW (ref >60)
Glucose, Bld: 690 mg/dL (ref 70–99)
Glucose, Bld: 325 mg/dL — ABNORMAL HIGH (ref 70–99)
Potassium: 4.1 mmol/L (ref 3.5–5.1)
Potassium: 3.8 mmol/L (ref 3.5–5.1)
Sodium: 134 mmol/L — ABNORMAL LOW (ref 135–145)
Sodium: 137 mmol/L (ref 135–145)
Total Bilirubin: 0.5 mg/dL (ref 0.3–1.2)
Total Bilirubin: 0.7 mg/dL (ref 0.3–1.2)
Total Protein: 7.4 g/dL (ref 6.5–8.1)
Total Protein: 6.2 g/dL — ABNORMAL LOW (ref 6.5–8.1)

## 2020-10-27 LAB — RAPID URINE DRUG SCREEN, HOSP PERFORMED
Amphetamines: NOT DETECTED
Barbiturates: NOT DETECTED
Benzodiazepines: POSITIVE — AB
Cocaine: NOT DETECTED
Opiates: NOT DETECTED
Tetrahydrocannabinol: POSITIVE — AB

## 2020-10-27 LAB — RESPIRATORY PANEL BY RT PCR (FLU A&B, COVID)
Influenza A by PCR: NEGATIVE
Influenza B by PCR: NEGATIVE
SARS Coronavirus 2 by RT PCR: NEGATIVE

## 2020-10-27 LAB — GLUCOSE, CAPILLARY
Glucose-Capillary: 133 mg/dL — ABNORMAL HIGH (ref 70–99)
Glucose-Capillary: 146 mg/dL — ABNORMAL HIGH (ref 70–99)
Glucose-Capillary: 151 mg/dL — ABNORMAL HIGH (ref 70–99)
Glucose-Capillary: 155 mg/dL — ABNORMAL HIGH (ref 70–99)
Glucose-Capillary: 143 mg/dL — ABNORMAL HIGH (ref 70–99)
Glucose-Capillary: 264 mg/dL — ABNORMAL HIGH (ref 70–99)
Glucose-Capillary: 291 mg/dL — ABNORMAL HIGH (ref 70–99)
Glucose-Capillary: 180 mg/dL — ABNORMAL HIGH (ref 70–99)

## 2020-10-27 LAB — I-STAT ARTERIAL BLOOD GAS, ED
Acid-base deficit: 5 mmol/L — ABNORMAL HIGH (ref 0.0–2.0)
Bicarbonate: 20.4 mmol/L (ref 20.0–28.0)
Calcium, Ion: 1.22 mmol/L (ref 1.15–1.40)
HCT: 44 % (ref 39.0–52.0)
Hemoglobin: 15 g/dL (ref 13.0–17.0)
O2 Saturation: 93 %
Patient temperature: 98.6
Potassium: 4.8 mmol/L (ref 3.5–5.1)
Sodium: 133 mmol/L — ABNORMAL LOW (ref 135–145)
TCO2: 22 mmol/L (ref 22–32)
pCO2 arterial: 38.9 mmHg (ref 32.0–48.0)
pH, Arterial: 7.327 — ABNORMAL LOW (ref 7.350–7.450)
pO2, Arterial: 70 mmHg — ABNORMAL LOW (ref 83.0–108.0)

## 2020-10-27 LAB — CBG MONITORING, ED
Glucose-Capillary: >600 mg/dL (ref 70–99)
Glucose-Capillary: 561 mg/dL (ref 70–99)
Glucose-Capillary: 453 mg/dL — ABNORMAL HIGH (ref 70–99)
Glucose-Capillary: 535 mg/dL (ref 70–99)
Glucose-Capillary: 334 mg/dL — ABNORMAL HIGH (ref 70–99)
Glucose-Capillary: 261 mg/dL — ABNORMAL HIGH (ref 70–99)
Glucose-Capillary: 198 mg/dL — ABNORMAL HIGH (ref 70–99)

## 2020-10-27 LAB — LIPASE, BLOOD: Lipase: 36 U/L (ref 11–51)

## 2020-10-27 LAB — HIV ANTIBODY (ROUTINE TESTING W REFLEX): HIV Screen 4th Generation wRfx: NONREACTIVE

## 2020-10-27 LAB — ECHOCARDIOGRAM COMPLETE
Area-P 1/2: 3.85 cm2
Height: 72 in
S' Lateral: 5.73 cm
Weight: 3047.64 oz

## 2020-10-27 LAB — STREP PNEUMONIAE URINARY ANTIGEN: Strep Pneumo Urinary Antigen: NEGATIVE

## 2020-10-27 LAB — TROPONIN I (HIGH SENSITIVITY)
Troponin I (High Sensitivity): 21 ng/L — ABNORMAL HIGH (ref <18)
Troponin I (High Sensitivity): 28 ng/L — ABNORMAL HIGH (ref <18)

## 2020-10-27 LAB — APTT
aPTT: 25 seconds (ref 24–36)
aPTT: 24 seconds (ref 24–36)

## 2020-10-27 LAB — CORTISOL: Cortisol, Plasma: 17.1 ug/dL

## 2020-10-27 LAB — MRSA PCR SCREENING: MRSA by PCR: NEGATIVE

## 2020-10-27 LAB — PHOSPHORUS: Phosphorus: 2.6 mg/dL (ref 2.5–4.6)

## 2020-10-27 LAB — PROTIME-INR
INR: 1 (ref 0.8–1.2)
INR: 0.9 (ref 0.8–1.2)
Prothrombin Time: 12.3 seconds (ref 11.4–15.2)
Prothrombin Time: 12 seconds (ref 11.4–15.2)

## 2020-10-27 LAB — AMYLASE: Amylase: 78 U/L (ref 28–100)

## 2020-10-27 LAB — SALICYLATE LEVEL: Salicylate Lvl: <7 mg/dL — ABNORMAL LOW (ref 7.0–30.0)

## 2020-10-27 LAB — ACETAMINOPHEN LEVEL: Acetaminophen (Tylenol), Serum: <10 ug/mL — ABNORMAL LOW (ref 10–30)

## 2020-10-27 LAB — PROCALCITONIN: Procalcitonin: <0.1 ng/mL

## 2020-10-27 LAB — MAGNESIUM: Magnesium: 2.3 mg/dL (ref 1.7–2.4)

## 2020-10-27 LAB — BETA-HYDROXYBUTYRIC ACID: Beta-Hydroxybutyric Acid: 0.18 mmol/L (ref 0.05–0.27)

## 2020-10-27 MED ORDER — PANTOPRAZOLE SODIUM 40 MG IV SOLR
40.0000 mg | Freq: Every day | INTRAVENOUS | Status: DC
  Administered 2020-10-27: 40 mg via INTRAVENOUS
  Filled 2020-10-27: qty 40

## 2020-10-27 MED ORDER — INSULIN ASPART 100 UNIT/ML ~~LOC~~ SOLN
4.0000 [IU] | Freq: Three times a day (TID) | SUBCUTANEOUS | Status: DC
  Administered 2020-10-27 – 2020-10-28 (×3): 4 [IU] via SUBCUTANEOUS

## 2020-10-27 MED ORDER — INSULIN ASPART 100 UNIT/ML ~~LOC~~ SOLN: 0.0000 [IU] | Freq: Every day | SUBCUTANEOUS | Status: DC

## 2020-10-27 MED ORDER — ATORVASTATIN CALCIUM 80 MG PO TABS
80.0000 mg | ORAL_TABLET | Freq: Every day | ORAL | Status: DC
  Administered 2020-10-27 – 2020-10-28 (×2): 80 mg via ORAL
  Filled 2020-10-27 (×2): qty 1

## 2020-10-27 MED ORDER — GADOBUTROL 1 MMOL/ML IV SOLN
8.6000 mL | Freq: Once | INTRAVENOUS | Status: AC | PRN
  Administered 2020-10-27: 8.6 mL via INTRAVENOUS

## 2020-10-27 MED ORDER — LEVETIRACETAM ER 500 MG PO TB24
500.0000 mg | ORAL_TABLET | Freq: Every day | ORAL | Status: DC
  Administered 2020-10-27: 500 mg via ORAL
  Filled 2020-10-27 (×3): qty 1

## 2020-10-27 MED ORDER — POLYETHYLENE GLYCOL 3350 17 G PO PACK: 17.0000 g | PACK | Freq: Every day | ORAL | Status: DC | PRN

## 2020-10-27 MED ORDER — INSULIN DETEMIR 100 UNIT/ML ~~LOC~~ SOLN
0.3000 [IU]/kg | SUBCUTANEOUS | Status: DC
  Administered 2020-10-27: 26 [IU] via SUBCUTANEOUS
  Filled 2020-10-27 (×2): qty 0.26

## 2020-10-27 MED ORDER — DILTIAZEM HCL-DEXTROSE 125-5 MG/125ML-% IV SOLN (PREMIX)
5.0000 mg/h | INTRAVENOUS | Status: DC
  Filled 2020-10-27: qty 125

## 2020-10-27 MED ORDER — LEVETIRACETAM IN NACL 1500 MG/100ML IV SOLN
1500.0000 mg | Freq: Once | INTRAVENOUS | Status: AC
  Administered 2020-10-27: 1500 mg via INTRAVENOUS
  Filled 2020-10-27: qty 100

## 2020-10-27 MED ORDER — SODIUM CHLORIDE 0.9% FLUSH
3.0000 mL | Freq: Once | INTRAVENOUS | Status: AC
  Administered 2020-10-27: 3 mL via INTRAVENOUS

## 2020-10-27 MED ORDER — LACTATED RINGERS IV SOLN: INTRAVENOUS | Status: DC

## 2020-10-27 MED ORDER — DOCUSATE SODIUM 100 MG PO CAPS: 100.0000 mg | ORAL_CAPSULE | Freq: Two times a day (BID) | ORAL | Status: DC | PRN

## 2020-10-27 MED ORDER — DEXTROSE IN LACTATED RINGERS 5 % IV SOLN: INTRAVENOUS | Status: DC

## 2020-10-27 MED ORDER — ACETAMINOPHEN 325 MG PO TABS
650.0000 mg | ORAL_TABLET | ORAL | Status: DC | PRN
  Administered 2020-10-27 – 2020-10-28 (×2): 650 mg via ORAL
  Filled 2020-10-27 (×2): qty 2

## 2020-10-27 MED ORDER — DEXTROSE 50 % IV SOLN: 0.0000 mL | INTRAVENOUS | Status: DC | PRN

## 2020-10-27 MED ORDER — INSULIN ASPART 100 UNIT/ML ~~LOC~~ SOLN
0.0000 [IU] | Freq: Three times a day (TID) | SUBCUTANEOUS | Status: DC
  Administered 2020-10-27: 8 [IU] via SUBCUTANEOUS
  Administered 2020-10-28: 5 [IU] via SUBCUTANEOUS

## 2020-10-27 MED ORDER — INSULIN REGULAR(HUMAN) IN NACL 100-0.9 UT/100ML-% IV SOLN
INTRAVENOUS | Status: DC
  Administered 2020-10-27: 13 [IU]/h via INTRAVENOUS
  Administered 2020-10-27: 12 [IU]/h via INTRAVENOUS

## 2020-10-27 MED ORDER — CHLORHEXIDINE GLUCONATE CLOTH 2 % EX PADS
6.0000 | MEDICATED_PAD | Freq: Every day | CUTANEOUS | Status: DC
  Administered 2020-10-27: 6 via TOPICAL

## 2020-10-27 NOTE — Procedures (Signed)
Patient Name: Alan Diaz  MRN: [id number]  Epilepsy Attending: Charlsie Quest  Referring Physician/Provider: Dr. Delia Heady Date: 10/27/2020 Duration: 23.15 mins  Patient history: 55 year old male presented with left visual field cut and sided weakness with left gaze deviation in setting of fingerstick glucose more than 500.  EEG to evaluate for seizures.  Level of alertness: Awake  AEDs during EEG study: Keppra  Technical aspects: This EEG study was done with scalp electrodes positioned according to the 10-20 International system of electrode placement. Electrical activity was acquired at a sampling rate of 500Hz  and reviewed with a high frequency filter of 70Hz  and a low frequency filter of 1Hz . EEG data were recorded continuously and digitally stored.   Description: No clear posterior dominant rhythm was seen.  EEG showed generalized mixed frequencies with predominantly 9 to 13 Hz generalized alpha-beta activity admixed with 2 to 3 Hz generalized delta activity.  Hyperventilation and photic stimulation were not performed.     ABNORMALITY -Continuous slow, generalized  IMPRESSION: This study is suggestive of moderate diffuse encephalopathy, nonspecific etiology.  No seizures or epileptiform discharges were seen throughout the recording.   Alera Quevedo 

## 2020-10-27 NOTE — H&P (Addendum)
NAME:  Alan Diaz, MRN:  [id number], DOB:  Jan 30, 1965, LOS: 0 ADMISSION DATE:  10/27/2020, CONSULTATION DATE: 10/27/2020 REFERRING MD: Emergency department physician, CHIEF COMPLAINT: Altered mental status hyperglycemia seizure  Brief History   Coronary disease status post CABG times 03/2017 Congestive heart failure Visual field disturbances secondary to fall 2 weeks ago Diabetes mellitus with noncompliance with medication  History of present illness   55 year old male with extensive cardiac history which he is noncompliant with medications status post CABG 2018 requiring intermittent balloon pump and amiodarone for nonsustained ventricular tachycardia.  He has known congestive heart failure with a EF of 25%.  He is noncompliant with medications.  He continues to smoke.  He presents to [facility] 10/27/2020 after having witnessed seizures x2 with left visual field deficit and left gaze preference.  After being transferred to the hospital during attempted CT scan he was became combative.  CT did show any new injuries but showed old injury in the left temporal area.  He was evaluated by neurology is questionable code stroke but no further interventions were required.  He will require an MRI in the future.  Due to the fact he is has hyperglycemia coupled with noncompliance of all medications he be admitted to the intensive care unit for a minimum of 24 hours to stabilize and any be transferred out into the hospitalist service at that time. Past Medical History   Past Medical History:  Diagnosis Date  . CAD (coronary artery disease) 2016   a. NSTEMI 5/16 s/p DES to pLAD and dRCA // b. NSTEMI 4/18 >> 95% LM, oLAD stent ok, oLCx 80, mRCA stent ok, EF < 25 >> IABP // c. s/p CABG with Dr. Zenaida Niece Trig (L-LAD, S-OM, S-PDA)  . CHF (congestive heart failure) (HCC)   . Chronic combined systolic and diastolic CHF (congestive heart failure) (HCC) 05/03/2015  . CKD (chronic kidney disease) stage 3, GFR  30-59 ml/min (HCC) 04/23/2017  . Diabetes mellitus   . GERD (gastroesophageal reflux disease)   . HLD (hyperlipidemia)   . Hypertension   . Ischemic cardiomyopathy    a. 2D ECHO 05/02/15 with EF 25-30%, mild LV dilation. Mild LVH, mod HKof the mid-apicalanteroseptal myocardium, G1DD // b. Mild LVH, EF 25, diffuse HK, grade 1 diastolic dysfunction, trivial MR // Echo 7/18: Mild concentric LVH, EF 25-30, inferior and inferolateral AK, grade 2 diastolic dysfunction, trivial MR/PI   . Left main coronary artery thrombosis (HCC)   . NSTEMI (non-ST elevated myocardial infarction) (HCC) 05/05/2015  . Tobacco abuse   . Type 2 diabetes mellitus with circulatory disorder (HCC) 10/31/2008   Qualifier: Diagnosis of  By: Daphine Deutscher FNP, [facility] Events   10/27/2020 admitted secondary to seizures all mental status  Consults:  10/19/2020 neurology  Procedures:    Significant Diagnostic Tests:  10/19/2020 CT of the head with injury noted left temporal area  Micro Data:    Antimicrobials:    Interim history/subjective:  55 year old noncompliant male with 2 seizures altered mental status and hyperglycemia be admitted to the ICU unit  Objective   Blood pressure 113/72, pulse 80, temperature 98.6 F (37 C), temperature source Oral, resp. rate 18, height 6' (1.829 m), weight 86.4 kg, SpO2 100 %.        Intake/Output Summary (Last 24 hours) at 10/27/2020 0806 Last data filed at 10/27/2020 0547 Gross per 24 hour  Intake 100 ml  Output --  Net 100 ml   [employer]  10/27/20 0500  Weight: 86.4 kg    Examination: General: Well-nourished well-developed male no acute distress at rest HENT: No JVD or lymphadenopathy is appreciated Lungs: Coarse rhonchi bilaterally Cardiovascular: Heart sounds are regular regular rate rhythm with sinus tach of 94 Abdomen: Soft nontender positive bowel sounds Extremities: Warm dry 2+ dorsalis pedis pulses Neuro: Complains of left  visual field deficit, complains of vertigo room room spinning.  Otherwise appears intact GU: Amber urine  Resolved Hospital Problem list     Assessment & Plan:  New onset of seizure activity x2 in the setting of a recent head injury left temporal area.  Noted to have visual disturbances and vertigo for approximately 2 weeks poorly controlled diabetes mellitus poor compliance with all medications and treatment. CT the head is noted Admit to the intensive care unit for 24 hours Appreciate neurology's input Code stroke evaluation has been completed He will need an MRI in the future Check urine drug screen for completeness  Hyperglycemia with poor compliance with medications CBG (last 3)  Recent Labs    10/27/20 0717 10/27/20 0750 10/27/20 0852  GLUCAP 453* 334* 261*    Insulin drip Fluid resuscitation Hemoglobin A1c He does not have an acidosis Ketones are noted in urine Monitor in intensive care unit for 24 hours  Extensive cardiac history, status post coronary bypass graft 2018 requiring intermittent BiPAP along with nonsustained ventricular tachycardia with institution of amiodarone. Ischemic cardiomyopathy with a EF of 25% Noncompliance with medications Admit to the intensive care unit Reinstitute medications that he is not been taking May need a cardiology evaluation while in hospital  Chronic kidney disease Lab Results  Component Value Date   CREATININE 2.20 (H) 10/27/2020   CREATININE 1.70 (H) 10/27/2020   CREATININE 1.51 (H) 07/11/2020   CREATININE 1.21 12/17/2016   CREATININE 1.28 09/18/2016   Fluid resuscitation Monitor in intensive care unit  Tobacco abuse Cessation counseling  Best practice:  Diet: NPO Pain/Anxiety/Delirium protocol (if indicated): As needed VAP protocol (if indicated): None indicated DVT prophylaxis: Sequential hose GI prophylaxis: PPI Glucose control: Insulin drip Mobility: Bedrest Code Status: Full Family Communication:  10/27/2020 patient updated at bedside Disposition: Admit to the intensive care unit for a minimum of 24 hours  Labs   CBC: Recent Labs  Lab 10/27/20 0509 10/27/20 0513 10/27/20 0546  WBC  --  9.5  --   NEUTROABS  --  4.6  --   HGB 17.3* 15.8 15.0  HCT 51.0 51.0 44.0  MCV  --  102.8*  --   PLT  --  276  --     Basic Metabolic Panel: Recent Labs  Lab 10/27/20 0509 10/27/20 0513 10/27/20 0546  NA 134* 134* 133*  K 4.1 4.1 4.8  CL 101 96*  --   CO2  --  11*  --   GLUCOSE >700* 690*  --   BUN 23* 23*  --   CREATININE 1.70* 2.20*  --   CALCIUM  --  9.3  --    GFR: Estimated Creatinine Clearance: 41.2 mL/min (A) (by C-G formula based on SCr of 2.2 mg/dL (H)). Recent Labs  Lab 10/27/20 0513  WBC 9.5    Liver Function Tests: Recent Labs  Lab 10/27/20 0513  AST 22  ALT 17  ALKPHOS 140*  BILITOT 0.5  PROT 7.4  ALBUMIN 3.5   No results for input(s): LIPASE, AMYLASE in the last 168 hours. No results for input(s): AMMONIA in the last 168 hours.  ABG  Component Value Date/Time   PHART 7.327 (L) 10/27/2020 0546   PCO2ART 38.9 10/27/2020 0546   PO2ART 70 (L) 10/27/2020 0546   HCO3 20.4 10/27/2020 0546   TCO2 22 10/27/2020 0546   ACIDBASEDEF 5.0 (H) 10/27/2020 0546   O2SAT 93.0 10/27/2020 0546     Coagulation Profile: Recent Labs  Lab 10/27/20 0513  INR 1.0    Cardiac Enzymes: No results for input(s): CKTOTAL, CKMB, CKMBINDEX, TROPONINI in the last 168 hours.  HbA1C: Hgb A1c MFr Bld  Date/Time Value Ref Range Status  09/09/2016 10:03 AM 7.2 (H) 4.8 - 5.6 % Final    Comment:    (NOTE)         Pre-diabetes: 5.7 - 6.4         Diabetes: >6.4         Glycemic control for adults with diabetes: <7.0   05/02/2015 12:25 PM 7.0 (H) 4.8 - 5.6 % Final    Comment:    (NOTE)         Pre-diabetes: 5.7 - 6.4         Diabetes: >6.4         Glycemic control for adults with diabetes: <7.0     CBG: Recent Labs  Lab 10/27/20 0501 10/27/20 0605  10/27/20 0643 10/27/20 0717 10/27/20 0750  GLUCAP >600* 561* 535* 453* 334*    Review of Systems:   10 point review of system taken, please see HPI for positives and negatives.  Past Medical History  He,  has a past medical history of CAD (coronary artery disease) (2016), CHF (congestive heart failure) (HCC), Chronic combined systolic and diastolic CHF (congestive heart failure) (HCC) (05/03/2015), CKD (chronic kidney disease) stage 3, GFR 30-59 ml/min (HCC) (04/23/2017), Diabetes mellitus, GERD (gastroesophageal reflux disease), HLD (hyperlipidemia), Hypertension, Ischemic cardiomyopathy, Left main coronary artery thrombosis (HCC), NSTEMI (non-ST elevated myocardial infarction) (HCC) (05/05/2015), Tobacco abuse, and Type 2 diabetes mellitus with circulatory disorder (HCC) (10/31/2008).   Surgical History    Past Surgical History:  Procedure Laterality Date  . CARDIAC CATHETERIZATION N/A 05/03/2015   Procedure: Right/Left Heart Cath and Coronary Angiography;  Surgeon: Peter M Swaziland, MD;  Location: [facility]AS[facility];  Service: Cardiovascular;  Laterality: N/A;  . CARDIAC CATHETERIZATION N/A 05/03/2015   Procedure: Intravascular Ultrasound/IVUS;  Surgeon: Peter M Swaziland, MD;  Location: [facility]AS[facility];  Service: Cardiovascular;  Laterality: N/A;  . CARDIAC CATHETERIZATION N/A 05/03/2015   Procedure: Coronary Stent Intervention;  Surgeon: Peter M Swaziland, MD;  Location: [facility];  Service: Cardiovascular;  Laterality: N/A;  rca  . CORONARY ARTERY BYPASS GRAFT N/A 04/24/2017   Procedure: CORONARY ARTERY BYPASS GRAFTING (CABG) times three using left intenal mammary artery and right saphenous vein;  Surgeon: Kerin Perna, MD;  Location: [facility] OR;  Service: Open Heart Surgery;  Laterality: N/A;  . IABP INSERTION N/A 04/24/2017   Procedure: IABP Insertion;  Surgeon: Marykay Lex, MD;  Location: [facility] INVASIVE CV LAB;  Service: Cardiovascular;  Laterality: N/A;  . LEFT HEART CATH  AND CORONARY ANGIOGRAPHY N/A 04/24/2017   Procedure: Left Heart Cath and Coronary Angiography;  Surgeon: Marykay Lex, MD;  Location: [facility] INVASIVE CV LAB;  Service: Cardiovascular;  Laterality: N/A;  . PERCUTANEOUS CORONARY STENT INTERVENTION (PCI-S)  05/03/2015   Procedure: Percutaneous Coronary Stent Intervention (Pci-S);  Surgeon: Peter M Swaziland, MD;  Location: [facility] INVASIVE CV LAB CUPID;  Service: Cardiovascular;;  Prox LAD  . TEE WITHOUT CARDIOVERSION N/A  04/24/2017   Procedure: TRANSESOPHAGEAL ECHOCARDIOGRAM (TEE);  Surgeon: Kerin Perna, MD;  Location: [facility] OR;  Service: Open Heart Surgery;  Laterality: N/A;  . WISDOM TOOTH EXTRACTION     bottom LT, top RT     Social History   reports that he has been smoking cigarettes. He has been smoking about 0.10 packs per day. He has never used smokeless tobacco. He reports that he does not drink alcohol and does not use drugs.   Family History   His family history includes Breast cancer in his mother; Diabetes in his brother and sister; Heart disease in his father; Hypertension in his brother and sister.   Allergies Allergies  Allergen Reactions  . Other Nausea And Vomiting and Other (See Comments)    Grape products     Home Medications  Prior to Admission medications   Medication Sig Start Date End Date Taking? Authorizing Provider  amiodarone (PACERONE) 200 MG tablet TAKE 1 TABLET(200 MG) BY MOUTH DAILY 06/01/20  Yes Pricilla Riffle, MD  atorvastatin (LIPITOR) 80 MG tablet Take 1 tablet (80 mg total) by mouth daily. 06/01/20  Yes Pricilla Riffle, MD  carvedilol (COREG) 12.5 MG tablet Take 1 tablet by mouth twice daily with a meal. Patient overdue for an appointment and must call and schedule for further refills1st attempt 05/24/20  Yes Pricilla Riffle, MD  clopidogrel (PLAVIX) 75 MG tablet Take 1 tablet (75 mg total) by mouth daily. 06/01/20  Yes Pricilla Riffle, MD  furosemide (LASIX) 40 MG tablet Take 1 tablet (40 mg total) by mouth daily. 06/01/20  Yes  Pricilla Riffle, MD  methocarbamol (ROBAXIN) 500 MG tablet Take 500 mg by mouth every 8 (eight) hours as needed. 10/18/20  Yes [provider]  sacubitril-valsartan (ENTRESTO) 24-26 MG Take 1 tablet by mouth 2 (two) times daily. 05/24/20  Yes Leone Brand, NP  aspirin EC 81 MG tablet Take 81 mg by mouth daily.    [provider]  diclofenac Sodium (VOLTAREN) 1 % GEL Apply 4 g topically in the morning and at bedtime.    [provider]  metFORMIN (GLUCOPHAGE) 1000 MG tablet Take 1,000 mg by mouth two times a day 07/30/17   [provider]     Critical care time:45 min    Brett Canales Kierstyn Baranowski ACNP Acute Care Nurse Practitioner [facility]/Critical [facility] Please consult Amion 10/27/2020, 8:06 AM

## 2020-10-27 NOTE — Progress Notes (Signed)
EEG completed, results pending. 

## 2020-10-27 NOTE — H&P (Signed)
Neurology H&P  CC: left sided weakness  History is obtained from: EMS and through chart review.  HPI: Alan Diaz is a 55 y.o. male arrives as code stroke for left sided weakness. The patient fell about 2 weeks ago hitting his head on the left side subsequently developing a left visual field cut. Sometime yesterday he developed left sided weakness and slurred speech. EMS eventually called and assessment showed left visual field cut and left weakness. Finger stick was >>500. Patient had 2 witnessed seizures with left sided gaze deviation and loss of bladder control and he was given versed 2.5mg  IV which ended seizure. On arrival the patient was fairly sedated and exam was very difficult. Patient was stable and taken for scan.  There was a delay in scanning patient as he became combative and moving all his extremities attempting to get off the table exclaiming he needed to urinate and unfortunately urinated on the bed. He refused to have his head secured for the scan but was temporarily subdued long enough to have the NCT head. He continued to move and sat up in an attempt to get off scanner and further imaging was not possible.   LKW: not clear  tpa/IR Thrombectomy: last normal not clear at all likely outside window and patient did not show lateralizing deficits suggesting LVO in setting of toxic-metabolic encephalopathy. Modified Rankin Scale: baseline not clear at the time. NIHSS: 16 Requires repeated stimulation to arouse+2 0 questions right+2 Performs 0 tasks+2 Horizontal extraocular movements Normal 0 No visual loss0 Minor paralysis (flat nasolabial fold, smile asymmetry)+1 Left and right upper ext drift, but doesn't hit bed+1 Left and right lower ext Some effort against gravity+2 No ataxia Normal sensation Mute+3 No dysarthria No extinction     ROS: Unable to assess due to altered mental status.   Past Medical History:  Diagnosis Date  . CAD (coronary artery disease) 2016    a. NSTEMI 5/16 s/p DES to pLAD and dRCA // b. NSTEMI 4/18 >> 95% LM, oLAD stent ok, oLCx 80, mRCA stent ok, EF < 25 >> IABP // c. s/p CABG with Dr. Zenaida Niece Trig (L-LAD, S-OM, S-PDA)  . CHF (congestive heart failure) (HCC)   . Chronic combined systolic and diastolic CHF (congestive heart failure) (HCC) 05/03/2015  . CKD (chronic kidney disease) stage 3, GFR 30-59 ml/min (HCC) 04/23/2017  . Diabetes mellitus   . GERD (gastroesophageal reflux disease)   . HLD (hyperlipidemia)   . Hypertension   . Ischemic cardiomyopathy    a. 2D ECHO 05/02/15 with EF 25-30%, mild LV dilation. Mild LVH, mod HKof the mid-apicalanteroseptal myocardium, G1DD // b. Mild LVH, EF 25, diffuse HK, grade 1 diastolic dysfunction, trivial MR // Echo 7/18: Mild concentric LVH, EF 25-30, inferior and inferolateral AK, grade 2 diastolic dysfunction, trivial MR/PI   . Left main coronary artery thrombosis (HCC)   . NSTEMI (non-ST elevated myocardial infarction) (HCC) 05/05/2015  . Tobacco abuse   . Type 2 diabetes mellitus with circulatory disorder (HCC) 10/31/2008   Qualifier: Diagnosis of  By: Daphine Deutscher FNP, Zena Amos       Family History  Problem Relation Age of Onset  . Breast cancer Mother   . Heart disease Father   . Diabetes Sister   . Hypertension Sister   . Hypertension Brother   . Diabetes Brother     Social History:  reports that he has been smoking cigarettes. He has been smoking about 0.10 packs per day. He has never used smokeless tobacco. He  reports that he does not drink alcohol and does not use drugs.   Prior to Admission medications   Medication Sig Start Date End Date Taking? Authorizing Provider  amiodarone (PACERONE) 200 MG tablet TAKE 1 TABLET(200 MG) BY MOUTH DAILY 06/01/20  Yes Pricilla Riffle, MD  atorvastatin (LIPITOR) 80 MG tablet Take 1 tablet (80 mg total) by mouth daily. 06/01/20  Yes Pricilla Riffle, MD  carvedilol (COREG) 12.5 MG tablet Take 1 tablet by mouth twice daily with a meal. Patient overdue for an  appointment and must call and schedule for further refills1st attempt 05/24/20  Yes Pricilla Riffle, MD  clopidogrel (PLAVIX) 75 MG tablet Take 1 tablet (75 mg total) by mouth daily. 06/01/20  Yes Pricilla Riffle, MD  furosemide (LASIX) 40 MG tablet Take 1 tablet (40 mg total) by mouth daily. 06/01/20  Yes Pricilla Riffle, MD  methocarbamol (ROBAXIN) 500 MG tablet Take 500 mg by mouth every 8 (eight) hours as needed. 10/18/20  Yes [provider]  sacubitril-valsartan (ENTRESTO) 24-26 MG Take 1 tablet by mouth 2 (two) times daily. 05/24/20  Yes Leone Brand, NP  aspirin EC 81 MG tablet Take 81 mg by mouth daily.    [provider]  diclofenac Sodium (VOLTAREN) 1 % GEL Apply 4 g topically in the morning and at bedtime.    [provider]  metFORMIN (GLUCOPHAGE) 1000 MG tablet Take 1,000 mg by mouth two times a day 07/30/17   [provider]   Exam: Current vital signs: BP 105/68 (BP Location: Right Arm)   Pulse 99   Temp 98.6 F (37 C) (Oral)   Resp 20   Ht 6' (1.829 m)   Wt 86.4 kg   SpO2 94%   BMI 25.83 kg/m   Physical Exam  Constitutional: Appears well-developed and well-nourished.  Psych: Unable to assess due to altered mental status. Eyes: +scleral injection HENT: No OP obstrucion Head: Normocephalic.  Cardiovascular: Normal rate and regular rhythm.  Respiratory: Effort normal and breath sounds normal to anterior ascultation GI: Soft.  No distension. There is no tenderness.  Skin: WDI  Neuro: Mental Status: Unable to assess due to altered mental status. Verbal: Unable to assess due to altered mental status. Cranial Nerves: II: Visual Fields Unable to assess due to altered mental status. Pupils are equal, round, and reactive to light. III,IV, VI: EOMI without ptosis or diploplia.  V: Facial sensation is symmetric to temperature VII: Facial left lower weakness  VIII: hearing is intact to voice X/X/XII: Unable to assess due to altered mental  status. Motor: Tone is normal. Bulk is normal. Moves all extremities with good strength. Sensory: Sensation intact to noxious stimuli symmetric Deep Tendon Reflexes: 2+ and symmetric in the biceps and patellae.  Plantars: Toes are downgoing bilaterally.  Cerebellar: FNF and HKS are intact bilaterally.    I have reviewed labs in epic and the pertinent results are: Results for BATES, COLLINGTON (MRN [id number]) as of 10/27/2020 06:07  Ref. Range 10/27/2020 06:05  Glucose-Capillary Latest Ref Range: 70 - 99 mg/dL 182 (HH)   I have reviewed the images obtained: NCT head showed small age indeterminate stroke right parietal-occipital junction.  Assessment: Alan Diaz is a 55 y.o. male with left visual field cut and left weakness and seizure with left gaze deviation. Though constellation of symptoms suggest seizure CT scan showed early subacute stroke likely related to fall 2 weeks ago.There is a possiblity of nonketotic hyperglycemic-related seizures however not certain  at this time as lab results are still pending and no ETCO2 at the time. Finger stick was >>500 and hyperglycemia is known to decrease seizure threshold likely provoking seizure.   Impression: Early subacute stroke Recent fall with head trauma Acute toxic-metabolic encephalopathy Hyperglycemia Provoked seizure.    Plan: - MRI brain without contrast. - Recommend vascular imaging with MRA head and neck. - Recommend TTE. - Recommend labs: HbA1c, lipid panel, TSH. - Recommend Statin if LDL > 70 - Aspirin 81mg  daily. - SBP goal - Permissive hypertension first 24 h < 220/110. Hold home medications for now. - Telemetry monitoring for arrhythmia. - Recommend bedside Swallow screen. - Recommend Stroke education. - Recommend PT/OT/SLP consult. - rEEG to eval for any epileptogenic discharges.  This patient is critically ill and at significant risk of neurological worsening, death and care requires constant monitoring of  vital signs, hemodynamics,respiratory and cardiac monitoring, neurological assessment, discussion with family, other specialists and medical decision making of high complexity. I spent 73 minutes of neurocritical care time  in the care of  this patient. This was time spent independent of any time provided by nurse practitioner or PA.  Electronically signed by: Dr. Pager: [phone] 10/27/2020, 5:51 AM

## 2020-10-27 NOTE — Progress Notes (Signed)
Pt unavailable for EEG. Pt will be going to MRI then to 4N Will attempt later when schedule permits

## 2020-10-27 NOTE — Progress Notes (Addendum)
STROKE TEAM PROGRESS NOTE   INTERVAL HISTORY Mr. Candelas states he has fallen twice recently once where he hit the left side of his head on the wall 1 week ago. He has never had a seizure or stroke before. He noticed visual changes on the left peripheral vision, like there was spinning, which is still present with flashes of light. He is not sure if he is still having left sided weakness.   Vitals:   10/27/20 1200 10/27/20 1300 10/27/20 1400 10/27/20 1500  BP: 114/78 123/88 (!) 130/92 (!) 137/91  Pulse: 73 76 73 79  Resp: 14 13 14 15   Temp: 98.5 F (36.9 C)     TempSrc: Axillary     SpO2: 99% 98% 99% 99%  Weight:      Height:       CBC:  Recent Labs  Lab 10/27/20 0513 10/27/20 0513 10/27/20 0546 10/27/20 0825  WBC 9.5  --   --  9.0  NEUTROABS 4.6  --   --   --   HGB 15.8   < > 15.0 14.7  HCT 51.0   < > 44.0 43.6  MCV 102.8*  --   --  95.8  PLT 276  --   --  222   < > = values in this interval not displayed.   Basic Metabolic Panel:  Recent Labs  Lab 10/27/20 0513 10/27/20 0513 10/27/20 0546 10/27/20 0825  NA 134*   < > 133* 137  K 4.1   < > 4.8 3.8  CL 96*  --   --  103  CO2 11*  --   --  23  GLUCOSE 690*  --   --  325*  BUN 23*  --   --  21*  CREATININE 2.20*  --   --  1.58*  CALCIUM 9.3  --   --  9.1  MG  --   --   --  2.3  PHOS  --   --   --  2.6   < > = values in this interval not displayed.   Urine Drug Screen:  Recent Labs  Lab 10/27/20 0826  LABOPIA NONE DETECTED  COCAINSCRNUR NONE DETECTED  LABBENZ POSITIVE*  AMPHETMU NONE DETECTED  THCU POSITIVE*  LABBARB NONE DETECTED    Alcohol Level No results for input(s): ETH in the last 168 hours.  IMAGING past 24 hours DG Chest Portable 1 View  Result Date: 10/27/2020 CLINICAL DATA:  Stroke symptoms EXAM: PORTABLE CHEST 1 VIEW COMPARISON:  Is 10/07/2020 FINDINGS: Chronic cardiomegaly. CABG. Stable aortic and hilar contours. There is no edema, consolidation, effusion, or pneumothorax. Increased  density along the anterior left third rib correlating with scar-like density and pleural calcification based on prior imaging. The focal opacity in the posterior left chest on prior is not clearly seen on this frontal view. IMPRESSION: No acute finding. Cardiomegaly. Electronically Signed   By: 12/07/2020 M.D.   On: 10/27/2020 06:11   EEG adult  Result Date: 10/27/2020 10/29/2020, MD     10/27/2020  3:08 PM Patient Name: Ac Colan MRN: [name] Epilepsy Attending: [id number] Referring Physician/Provider: Dr. Charlsie Quest Date: 10/27/2020 Duration: 23.15 mins Patient history: 55 year old male presented with left visual field cut and sided weakness with left gaze deviation in setting of fingerstick glucose more than 500.  EEG to evaluate for seizures. Level of alertness: Awake AEDs during EEG study: Keppra Technical aspects: This EEG study was done with scalp electrodes  positioned according to the 10-20 International system of electrode placement. Electrical activity was acquired at a sampling rate of 500Hz  and reviewed with a high frequency filter of 70Hz  and a low frequency filter of 1Hz . EEG data were recorded continuously and digitally stored. Description: No clear posterior dominant rhythm was seen.  EEG showed generalized mixed frequencies with predominantly 9 to 13 Hz generalized alpha-beta activity admixed with 2 to 3 Hz generalized delta activity.  Hyperventilation and photic stimulation were not performed.   ABNORMALITY -Continuous slow, generalized IMPRESSION: This study is suggestive of moderate diffuse encephalopathy, nonspecific etiology. No seizures or epileptiform discharges were seen throughout the recording.   CT HEAD CODE STROKE WO CONTRAST  Result Date: 10/27/2020 CLINICAL DATA:  Code stroke.  Stroke follow-up EXAM: CT HEAD WITHOUT CONTRAST TECHNIQUE: Contiguous axial images were obtained from the base of the skull through the vertex without  intravenous contrast. COMPARISON:  10/30/2011 FINDINGS: Brain: Small area of apparent infarct along the high right occipital cortex, age indeterminate but possibly acute. No hemorrhage, hydrocephalus, or masslike finding. Vascular: No hyperdense vessel.  Atheromatous calcification. Skull: Normal. Negative for fracture or focal lesion.  This Sinuses/Orbits: Remote blowout fracture of the medial wall left orbit. Other: These results were communicated to Dr Charlsie Quest at 5:23 amon 10/28/2021by text page via the [facility] messaging system. ASPECTS [facility] Stroke Program Early CT Score) Not scored without localizing history. IMPRESSION: 1. Age-indeterminate right occipital cortex infarct which is small. 2. No acute hemorrhage. Electronically Signed   By: 10/29/2020 M.D.   On: 10/27/2020 05:25    PHYSICAL EXAM Constitution: drowsy but alert and oriented, supine in bed, healthy appearing  Eyes: no icterus or injection  Cardio: regular rate and rhythm Respiratory: non-labored breathing, on room air Neuro: Mental Status: Patient is awake but drowsy, alert, oriented to person, place, month, year, and situation. Speech-shows no dysarthria or aphasia.  Naming, repeating and comprehension intact.  Patient is able to give a clear history of events. Able to follow commands Cranial Nerves: II: decreased peripheral vision on left  Left homonymous hemianopsia III,IV, VI: EOMI without ptosis or diploplia. Pupils equal, round and reactive to light V: Facial sensation is symmetric to temperature VII: left sided facial droop VIII: hearing is intact to voice X: Palate elevates symmetrically XI: Shoulder shrug is symmetric. XII: tongue is midline without atrophy or fasciculations.  Motor: Strength is 5/5 upper extremities bilaterally with decreased fine motor strength on the left and right UE orbiting left  Lower extremity strength 5/5 with 4/5 with flexion but has bilateral slight drift with leg flexion   Sensory: Sensation is symmetric to light touch and temperature in the arms and legs.  Cerebellar: Normal finger to nose  ASSESSMENT/PLAN Mr. Cire Clute is a 55 y.o. male with history of CAD and MI s/p CABG 2018, TIIDM, and fall 2 weeks ago where he hit the left side of his head with subsequent L visual field cut, now presenting with L sided weakness and slurred speech and experiencing two witness seizures with left sided gaze deviation and loss of bladder control with hyperglycemia >700. Seizure resolved with 2.5 mg IV versed.   Subacute Right Occipital Stroke possibly from vertebral artery dissection related to a fall a week ago.  New onset seizures possibly symptomatic from right PCA infarct not visualized on CT  Code Stroke CT head with age indeterminate R occipital infarct. Possibly secondary to recent trauma with fall, need MRA to evaluate for vertebral dissection.  Stat MRI brain, MRA head and neck ordered   2D Echo pending. Last echo 08/2017 showed EF 25-30% with diffuse hypokinesis  LDL 108  HgbA1c pending  VTE prophylaxis - SCDs     Diet   Diet heart healthy/carb modified Room service appropriate? Yes; Fluid consistency: Thin    aspirin 81 mg daily and clopidogrel 75 mg daily prior to admission, now on No antithrombotic.   PT/OT   Therapy recommendations:  pending  Disposition:  Admitted to ICU with witnessed seizure and earlier post-ictal AMS  Provoked Seizure Possibly provoked by HHS with hyperglycemia >700, which can lower seizure threshold. Also cannot rule out vertebral artery dissection with recent fall and additional infarcts not seen on CT.   Loaded with keppra IV  Placed on 500 XR daily  EEG ordered   Hypertension  Home medications include pacerone 200 mg qd, coreg 12.5 mg bid, lasix 40 mg qd, and entresto 24-26 mg bid   Stable . Normotensive blood pressure control with risk of vertebral dissection  Hyperlipidemia  Home meds:  lipitor  80  resumed in hospital  LDL 108, goal < 70  Continue statin at discharge.   Diabetes type II Uncontrolled Hyperosmotic Hyperglycemia - resolved hyperglycemic to >700 at admission, now improved on insulin gtt. Had shoulder injection earlier this week with orthopedics and was told his glucose might increase.   HgbA1c pending, goal < 7.0  On insulin gtt, transitioning to SSI   Other Stroke Risk Factors  Cigarette smoker, advised to stop smoking  Coronary artery disease s/p MI, CABG 2018  Hx NSVT  Chronic combined systolic and diastolic Congestive heart failure w/ EF 25% on Enestro  Ischemic cardiomyopathy   Other Active Problems  Noncompliance w/ meds  AKI with CKD  Hospital day # 0  Stroke Attending Note: Patient states he had a fall a week ago injuring his shoulder as well as left temple and immediately started noticing left-sided peripheral vision difficulties.  His CT scan shows what looks like a subacute right occipital subcortical infarct but MRI has not yet been completed.  He had couple of seizures yesterday and was loaded with Keppra and has had no further breakthrough seizures.  Recommend check MRI scan to look for PCA infarct as well as MRI of the brain and neck to look for vertebral artery dissection.  Continue Keppra for seizure prophylaxis.  Start aspirin 81 mg and Plavix 75 mg daily for stroke prevention.  Aggressive risk factor modification.  Long discussion with the patient about his stroke and need for further evaluation and seizure prophylaxis and answered questions.  Greater than 50% time during this 35-minute visit was spent on counseling and coordination of care about his seizures and stroke and answering questions. Delia Heady, MD To contact Stroke Continuity provider, please refer to [email or web address]. After hours, contact General Neurology

## 2020-10-27 NOTE — ED Notes (Signed)
Date and time results received: 10/27/20 0555 (use smartphrase ".now" to insert current time)  Test: Glucose  Critical Value: 690  Name of Provider Notified: Palumbo   Orders Received? Or Actions Taken?: See orders and MAR

## 2020-10-27 NOTE — ED Triage Notes (Signed)
Pt EMS arrival from home, co CODE STROKE following witnessed seizure. Per EMS L sided weakness, facial droop, slurred speech, postictal with incontinence, A&Ox4 on scene. EMS administered 2.5mg  versed, 97% RA, non breather 10L 99%. EMS vitals 142/80, CBG 502, 18G L AC

## 2020-10-27 NOTE — Progress Notes (Signed)
  Echocardiogram 2D Echocardiogram has been performed.  Alan Diaz G Alan Diaz 10/27/2020, 4:06 PM

## 2020-10-27 NOTE — Progress Notes (Signed)
  Echocardiogram 2D Echocardiogram has been attempted. Patient receiving EEG. Will reattempt at later time.  Alan Diaz G Alan Diaz 10/27/2020, 2:00 PM

## 2020-10-27 NOTE — ED Provider Notes (Signed)
[facility] EMERGENCY DEPARTMENT Provider Note   CSN: [id number] Arrival date & time: 10/27/20  0459     History Chief Complaint  Patient presents with  . Code Stroke    Matthe Sloane is a 55 y.o. male.  The history is provided by the EMS personnel. The history is limited by the condition of the patient.  Cerebrovascular Accident This is a new problem. The current episode started less than 1 hour ago. The problem occurs constantly. The problem has not changed since onset.Pertinent negatives include no abdominal pain. Nothing aggravates the symptoms. Nothing relieves the symptoms. He has tried nothing for the symptoms. The treatment provided no relief.  Patient with a h/o CAD, DM and CHF presents as a code stroke.  Patient reportedly had a fall on Friday. Then this am was witnessed by a family member to have left sided weakness and Left facial droop and then had a witnessed seizure.  No return to baseline and then witnessed to seize for approximately 3 minutes for EMS, also reportedly had a left gaze prefrence and was in AFIB with RVR with a rate in the 140s.  This broke on it's own.  Patient also lost controls of his bladder.       Past Medical History:  Diagnosis Date  . CAD (coronary artery disease) 2016   a. NSTEMI 5/16 s/p DES to pLAD and dRCA // b. NSTEMI 4/18 >> 95% LM, oLAD stent ok, oLCx 80, mRCA stent ok, EF < 25 >> IABP // c. s/p CABG with Dr. Zenaida Niece Trig (L-LAD, S-OM, S-PDA)  . CHF (congestive heart failure) (HCC)   . Chronic combined systolic and diastolic CHF (congestive heart failure) (HCC) 05/03/2015  . CKD (chronic kidney disease) stage 3, GFR 30-59 ml/min (HCC) 04/23/2017  . Diabetes mellitus   . GERD (gastroesophageal reflux disease)   . HLD (hyperlipidemia)   . Hypertension   . Ischemic cardiomyopathy    a. 2D ECHO 05/02/15 with EF 25-30%, mild LV dilation. Mild LVH, mod HKof the mid-apicalanteroseptal myocardium, G1DD // b. Mild LVH, EF 25, diffuse  HK, grade 1 diastolic dysfunction, trivial MR // Echo 7/18: Mild concentric LVH, EF 25-30, inferior and inferolateral AK, grade 2 diastolic dysfunction, trivial MR/PI   . Left main coronary artery thrombosis (HCC)   . NSTEMI (non-ST elevated myocardial infarction) (HCC) 05/05/2015  . Tobacco abuse   . Type 2 diabetes mellitus with circulatory disorder (HCC) 10/31/2008   Qualifier: Diagnosis of  By: Daphine Deutscher FNP, Zena Amos      Patient Active Problem List   Diagnosis Date Noted  . NSVT (nonsustained ventricular tachycardia) (HCC) 05/17/2017  . 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 Left main coronary artery thrombosis (HCC)   . CKD (chronic kidney disease) stage 3, GFR 30-59 ml/min (HCC) 04/23/2017  . Cardiomyopathy, ischemic 09/09/2016  . Non compliance w medication regimen 09/09/2016  . NSTEMI (non-ST elevated myocardial infarction) (HCC) 05/05/2015  . GERD (gastroesophageal reflux disease)   . HLD (hyperlipidemia)   . CAD (coronary artery disease)   . Chronic combined systolic and diastolic CHF (congestive heart failure) (HCC) 05/03/2015  . Essential hypertension 05/03/2015  . TOBACCO ABUSE 01/19/2009  . Type 2 diabetes mellitus with circulatory disorder (HCC) 10/31/2008    Past Surgical History:  Procedure Laterality Date  . CARDIAC CATHETERIZATION N/A 05/03/2015   Procedure: Right/Left Heart Cath and Coronary Angiography;  Surgeon: Peter M Swaziland, MD;  Location: [facility] INVASIVE CV LAB CUPID;  Service: Cardiovascular;  Laterality: N/A;  .  CARDIAC CATHETERIZATION N/A 05/03/2015   Procedure: Intravascular Ultrasound/IVUS;  Surgeon: Peter M Swaziland, MD;  Location: [facility] INVASIVE CV LAB CUPID;  Service: Cardiovascular;  Laterality: N/A;  . CARDIAC CATHETERIZATION N/A 05/03/2015   Procedure: Coronary Stent Intervention;  Surgeon: Peter M Swaziland, MD;  Location: [facility] INVASIVE CV LAB CUPID;  Service: Cardiovascular;  Laterality: N/A;  rca  . CORONARY ARTERY BYPASS GRAFT N/A 04/24/2017   Procedure: CORONARY ARTERY BYPASS  GRAFTING (CABG) times three using left intenal mammary artery and right saphenous vein;  Surgeon: Kerin Perna, MD;  Location: [facility] OR;  Service: Open Heart Surgery;  Laterality: N/A;  . IABP INSERTION N/A 04/24/2017   Procedure: IABP Insertion;  Surgeon: Marykay Lex, MD;  Location: [facility] INVASIVE CV LAB;  Service: Cardiovascular;  Laterality: N/A;  . LEFT HEART CATH AND CORONARY ANGIOGRAPHY N/A 04/24/2017   Procedure: Left Heart Cath and Coronary Angiography;  Surgeon: Marykay Lex, MD;  Location: [facility] INVASIVE CV LAB;  Service: Cardiovascular;  Laterality: N/A;  . PERCUTANEOUS CORONARY STENT INTERVENTION (PCI-S)  05/03/2015   Procedure: Percutaneous Coronary Stent Intervention (Pci-S);  Surgeon: Peter M Swaziland, MD;  Location: [facility] INVASIVE CV LAB CUPID;  Service: Cardiovascular;;  Prox LAD  . TEE WITHOUT CARDIOVERSION N/A 04/24/2017   Procedure: TRANSESOPHAGEAL ECHOCARDIOGRAM (TEE);  Surgeon: Kerin Perna, MD;  Location: [facility] OR;  Service: Open Heart Surgery;  Laterality: N/A;  . WISDOM TOOTH EXTRACTION     bottom LT, top RT       Family History  Problem Relation Age of Onset  . Breast cancer Mother   . Heart disease Father   . Diabetes Sister   . Hypertension Sister   . Hypertension Brother   . Diabetes Brother     Social History   Tobacco Use  . Smoking status: Current Some Day Smoker    Packs/day: 0.10    Types: Cigarettes    Last attempt to quit: 05/03/2015    Years since quitting: 5.4  . Smokeless tobacco: Never Used  Vaping Use  . Vaping Use: Never used  Substance Use Topics  . Alcohol use: No    Alcohol/week: 0.0 standard drinks    Comment: very occasionally  . Drug use: No    Types: Marijuana    Comment: denies 06/28/14    Home Medications Prior to Admission medications   Medication Sig Start Date End Date Taking? Authorizing Provider  amiodarone (PACERONE) 200 MG tablet TAKE 1 TABLET(200 MG) BY MOUTH DAILY 06/01/20  Yes Pricilla Riffle, MD  atorvastatin (LIPITOR) 80  MG tablet Take 1 tablet (80 mg total) by mouth daily. 06/01/20  Yes Pricilla Riffle, MD  carvedilol (COREG) 12.5 MG tablet Take 1 tablet by mouth twice daily with a meal. Patient overdue for an appointment and must call and schedule for further refills1st attempt 05/24/20  Yes Pricilla Riffle, MD  clopidogrel (PLAVIX) 75 MG tablet Take 1 tablet (75 mg total) by mouth daily. 06/01/20  Yes Pricilla Riffle, MD  furosemide (LASIX) 40 MG tablet Take 1 tablet (40 mg total) by mouth daily. 06/01/20  Yes Pricilla Riffle, MD  methocarbamol (ROBAXIN) 500 MG tablet Take 500 mg by mouth every 8 (eight) hours as needed. 10/18/20  Yes [provider]  sacubitril-valsartan (ENTRESTO) 24-26 MG Take 1 tablet by mouth 2 (two) times daily. 05/24/20  Yes Leone Brand, NP  aspirin EC 81 MG tablet Take 81 mg by mouth daily.    [provider]  diclofenac Sodium (VOLTAREN) 1 % GEL Apply 4 g topically in the morning and at bedtime.    [provider]  metFORMIN (GLUCOPHAGE) 1000 MG tablet Take 1,000 mg by mouth two times a day 07/30/17   [provider]    Allergies    Other  Review of Systems   Review of Systems  Unable to perform ROS: Acuity of condition  Constitutional: Negative for fever.  HENT: Negative for facial swelling.   Cardiovascular: Negative for leg swelling.       AFIB  Gastrointestinal: Negative for abdominal pain.  Genitourinary: Negative for difficulty urinating.  Skin: Negative for wound.  Neurological: Positive for seizures, facial asymmetry and weakness.    Physical Exam Updated Vital Signs BP 105/68 (BP Location: Right Arm)   Pulse 99   Temp 98.6 F (37 C) (Oral)   Resp 20   Ht 6' (1.829 m)   Wt 86.4 kg   SpO2 94%   BMI 25.83 kg/m   Physical Exam Vitals and nursing note reviewed.  Constitutional:      Appearance: He is not diaphoretic.  HENT:     Head: Normocephalic and atraumatic.     Nose: Nose normal.     Mouth/Throat:     Mouth: Mucous membranes  are moist.     Pharynx: Oropharynx is clear.  Eyes:     Extraocular Movements: Extraocular movements intact.     Conjunctiva/sclera: Conjunctivae normal.     Pupils: Pupils are equal, round, and reactive to light.  Cardiovascular:     Rate and Rhythm: Regular rhythm. Tachycardia present.     Pulses: Normal pulses.     Heart sounds: Normal heart sounds.  Pulmonary:     Effort: Pulmonary effort is normal.     Breath sounds: Normal breath sounds.  Abdominal:     General: Abdomen is flat. Bowel sounds are normal.     Palpations: Abdomen is soft.     Tenderness: There is no abdominal tenderness. There is no guarding or rebound.  Musculoskeletal:        General: Normal range of motion.     Cervical back: Normal range of motion and neck supple.  Skin:    General: Skin is warm and dry.     Capillary Refill: Capillary refill takes less than 2 seconds.  Neurological:     GCS: GCS eye subscore is 2. GCS verbal subscore is 2. GCS motor subscore is 5.     Deep Tendon Reflexes: Reflexes normal.     Comments: Left facial droop      ED Results / Procedures / Treatments   Labs (all labs ordered are listed, but only abnormal results are displayed) Results for orders placed or performed during the hospital encounter of 10/27/20  Protime-INR  Result Value Ref Range   Prothrombin Time 12.3 11.4 - 15.2 seconds   INR 1.0 0.8 - 1.2  APTT  Result Value Ref Range   aPTT 25 24 - 36 seconds  CBC  Result Value Ref Range   WBC 9.5 4.0 - 10.5 K/uL   RBC 4.96 4.22 - 5.81 MIL/uL   Hemoglobin 15.8 13.0 - 17.0 g/dL   HCT 09.9 39 - 52 %   MCV 102.8 (H) 80.0 - 100.0 fL   MCH 31.9 26.0 - 34.0 pg   MCHC 31.0 30.0 - 36.0 g/dL   RDW 83.3 82.5 - 05.3 %   Platelets 276 150 - 400 K/uL   nRBC 0.0 0.0 - 0.2 %  Differential  Result Value Ref Range   Neutrophils Relative % 47 %   Neutro Abs 4.6 1.7 - 7.7 K/uL   Lymphocytes Relative 41 %   Lymphs Abs 3.9 0.7 - 4.0 K/uL   Monocytes Relative 9 %   Monocytes  Absolute 0.8 0.1 - 1.0 K/uL   Eosinophils Relative 1 %   Eosinophils Absolute 0.1 0.0 - 0.5 K/uL   Basophils Relative 1 %   Basophils Absolute 0.1 0.0 - 0.1 K/uL   Immature Granulocytes 1 %   Abs Immature Granulocytes 0.05 0.00 - 0.07 K/uL  Comprehensive metabolic panel  Result Value Ref Range   Sodium 134 (L) 135 - 145 mmol/L   Potassium 4.1 3.5 - 5.1 mmol/L   Chloride 96 (L) 98 - 111 mmol/L   CO2 11 (L) 22 - 32 mmol/L   Glucose, Bld 690 (HH) 70 - 99 mg/dL   BUN 23 (H) 6 - 20 mg/dL   Creatinine, Ser 1.61 (H) 0.61 - 1.24 mg/dL   Calcium 9.3 8.9 - 09.6 mg/dL   Total Protein 7.4 6.5 - 8.1 g/dL   Albumin 3.5 3.5 - 5.0 g/dL   AST 22 15 - 41 U/L   ALT PENDING 0 - 44 U/L   Alkaline Phosphatase 140 (H) 38 - 126 U/L   Total Bilirubin 0.5 0.3 - 1.2 mg/dL   GFR, Estimated 34 (L) >60 mL/min   Anion gap 27 (H) 5 - 15  I-stat chem 8, ED  Result Value Ref Range   Sodium 134 (L) 135 - 145 mmol/L   Potassium 4.1 3.5 - 5.1 mmol/L   Chloride 101 98 - 111 mmol/L   BUN 23 (H) 6 - 20 mg/dL   Creatinine, Ser 0.45 (H) 0.61 - 1.24 mg/dL   Glucose, Bld >409 (HH) 70 - 99 mg/dL   Calcium, Ion 8.11 (L) 1.15 - 1.40 mmol/L   TCO2 12 (L) 22 - 32 mmol/L   Hemoglobin 17.3 (H) 13.0 - 17.0 g/dL   HCT 91.4 39 - 52 %   Comment NOTIFIED PHYSICIAN   CBG monitoring, ED  Result Value Ref Range   Glucose-Capillary >600 (HH) 70 - 99 mg/dL  I-Stat arterial blood gas, ED  Result Value Ref Range   pH, Arterial 7.327 (L) 7.35 - 7.45   pCO2 arterial 38.9 32 - 48 mmHg   pO2, Arterial 70 (L) 83 - 108 mmHg   Bicarbonate 20.4 20.0 - 28.0 mmol/L   TCO2 22 22 - 32 mmol/L   O2 Saturation 93.0 %   Acid-base deficit 5.0 (H) 0.0 - 2.0 mmol/L   Sodium 133 (L) 135 - 145 mmol/L   Potassium 4.8 3.5 - 5.1 mmol/L   Calcium, Ion 1.22 1.15 - 1.40 mmol/L   HCT 44.0 39 - 52 %   Hemoglobin 15.0 13.0 - 17.0 g/dL   Patient temperature 78.2 F    Collection site Radial    Drawn by RT    Sample type ARTERIAL   CBG monitoring, ED    Result Value Ref Range   Glucose-Capillary 561 (HH) 70 - 99 mg/dL   Comment 1 Document in Chart    DG Chest 2 View  Result Date: 10/07/2020 CLINICAL DATA:  Shortness of breath.  Follow-up pneumonia. EXAM: CHEST - 2 VIEW COMPARISON:  05/24/2020 FINDINGS: Previous median sternotomy and CABG. Heart size upper limits of normal. Previously seen left lung pneumonia has resolved. There does remain a focal density in the posterior chest on the left that could  be some loculated pleural fluid. Mass lesion is not excluded but not favored. I do not see any new active pulmonary infiltrate. Areas of pulmonary scarring as seen previously. IMPRESSION: Resolution of left lung pneumonia. Some persistent well-circumscribed density in the posterior left chest probably representing loculated pleural fluid. Mass lesion less likely. No new areas of pulmonary infiltrate identified. Previous CABG. Electronically Signed   By: Paulina Fusi M.D.   On: 10/07/2020 16:47   CT HEAD CODE STROKE WO CONTRAST  Result Date: 10/27/2020 CLINICAL DATA:  Code stroke.  Stroke follow-up EXAM: CT HEAD WITHOUT CONTRAST TECHNIQUE: Contiguous axial images were obtained from the base of the skull through the vertex without intravenous contrast. COMPARISON:  10/30/2011 FINDINGS: Brain: Small area of apparent infarct along the high right occipital cortex, age indeterminate but possibly acute. No hemorrhage, hydrocephalus, or masslike finding. Vascular: No hyperdense vessel.  Atheromatous calcification. Skull: Normal. Negative for fracture or focal lesion.  This Sinuses/Orbits: Remote blowout fracture of the medial wall left orbit. Other: These results were communicated to Dr Thomasena Edis at 5:23 amon 10/28/2021by text page via the [facility] messaging system. ASPECTS [facility] Stroke Program Early CT Score) Not scored without localizing history. IMPRESSION: 1. Age-indeterminate right occipital cortex infarct which is small. 2. No acute hemorrhage. Electronically  Signed   By: Marnee Spring M.D.   On: 10/27/2020 05:25    EKG EKG Interpretation  Date/Time:  Thursday October 27 2020 05:36:08 EDT Ventricular Rate:  97 PR Interval:    QRS Duration: 120 QT Interval:  361 QTC Calculation: 459 R Axis:   34 Text Interpretation: Sinus rhythm Probable left atrial enlargement LVH with secondary repolarization abnormality Confirmed by Nicanor Alcon, Jacinta Penalver ([id number]) on 10/27/2020 5:59:45 AM   Radiology CT HEAD CODE STROKE WO CONTRAST  Result Date: 10/27/2020 CLINICAL DATA:  Code stroke.  Stroke follow-up EXAM: CT HEAD WITHOUT CONTRAST TECHNIQUE: Contiguous axial images were obtained from the base of the skull through the vertex without intravenous contrast. COMPARISON:  10/30/2011 FINDINGS: Brain: Small area of apparent infarct along the high right occipital cortex, age indeterminate but possibly acute. No hemorrhage, hydrocephalus, or masslike finding. Vascular: No hyperdense vessel.  Atheromatous calcification. Skull: Normal. Negative for fracture or focal lesion.  This Sinuses/Orbits: Remote blowout fracture of the medial wall left orbit. Other: These results were communicated to Dr Thomasena Edis at 5:23 amon 10/28/2021by text page via the [facility] messaging system. ASPECTS [facility] Stroke Program Early CT Score) Not scored without localizing history. IMPRESSION: 1. Age-indeterminate right occipital cortex infarct which is small. 2. No acute hemorrhage. Electronically Signed   By: Marnee Spring M.D.   On: 10/27/2020 05:25    Procedures Procedures (including critical care time)  Medications Ordered in ED Medications  insulin regular, human (MYXREDLIN) 100 units/ 100 mL infusion (13 Units/hr Intravenous New Bag/Given 10/27/20 0532)  lactated ringers infusion ( Intravenous New Bag/Given 10/27/20 0530)  dextrose 5 % in lactated ringers infusion (has no administration in time range)  dextrose 50 % solution 0-50 mL (has no administration in time range)  sodium chloride  flush (NS) 0.9 % injection 3 mL (3 mLs Intravenous Given 10/27/20 0527)  levETIRAcetam (KEPPRA) IVPB 1500 mg/ 100 mL premix (0 mg Intravenous Stopped 10/27/20 0547)    ED Course  I have reviewed the triage vital signs and the nursing notes.  Pertinent labs & imaging results that were available during my care of the patient were reviewed by me and considered in my medical decision making (see chart for details).  Patient is altered secondary to being post ictal and versed given by EMS.    MDM Reviewed: previous chart, nursing note and vitals Reviewed previous: labs and ECG Interpretation: labs, ECG, x-ray and CT scan (no bleed on CT by me.  Elevated anion gap with hyperglycemia by me.  acidosis on ABG by me ) Total time providing critical care: 75-105 minutes (insulin drip for DKA ). This excludes time spent performing separately reportable procedures and services. Consults: neurology and critical care  CRITICAL CARE Performed by: Fallon Haecker K Coleston Dirosa-Rasch Total critical care time: 75 minutes Critical care time was exclusive of separately billable procedures and treating other patients. Critical care was necessary to treat or prevent imminent or life-threatening deterioration. Critical care was time spent personally by me on the following activities: development of treatment plan with patient and/or surrogate as well as nursing, discussions with consultants, evaluation of patient's response to treatment, examination of patient, obtaining history from patient or surrogate, ordering and performing treatments and interventions, ordering and review of laboratory studies, ordering and review of radiographic studies, pulse oximetry and re-evaluation of patient's condition.  Final Clinical Impression(s) / ED Diagnoses Final diagnoses:  Hyperglycemia  Status epilepticus (HCC)  Stroke-like episode  Abnormal EKG   Admit to PCCM case d/w Dr, Daylene Katayama, Khari Lett, MD 10/27/20 [phone]

## 2020-10-28 DIAGNOSIS — R569 Unspecified convulsions: Secondary | ICD-10-CM

## 2020-10-28 LAB — BASIC METABOLIC PANEL
Anion gap: 8 (ref 5–15)
BUN: 13 mg/dL (ref 6–20)
CO2: 24 mmol/L (ref 22–32)
Calcium: 8.3 mg/dL — ABNORMAL LOW (ref 8.9–10.3)
Chloride: 104 mmol/L (ref 98–111)
Creatinine, Ser: 1.21 mg/dL (ref 0.61–1.24)
GFR, Estimated: >60 mL/min (ref >60)
Glucose, Bld: 197 mg/dL — ABNORMAL HIGH (ref 70–99)
Potassium: 3.4 mmol/L — ABNORMAL LOW (ref 3.5–5.1)
Sodium: 136 mmol/L (ref 135–145)

## 2020-10-28 LAB — CBC
HCT: 41.3 % (ref 39.0–52.0)
Hemoglobin: 13.9 g/dL (ref 13.0–17.0)
MCH: 32.2 pg (ref 26.0–34.0)
MCHC: 33.7 g/dL (ref 30.0–36.0)
MCV: 95.6 fL (ref 80.0–100.0)
Platelets: 216 10*3/uL (ref 150–400)
RBC: 4.32 MIL/uL (ref 4.22–5.81)
RDW: 13.1 % (ref 11.5–15.5)
WBC: 7.3 10*3/uL (ref 4.0–10.5)
nRBC: 0 % (ref 0.0–0.2)

## 2020-10-28 LAB — PHOSPHORUS: Phosphorus: 2.7 mg/dL (ref 2.5–4.6)

## 2020-10-28 LAB — URINE CULTURE: Culture: NO GROWTH

## 2020-10-28 LAB — GLUCOSE, CAPILLARY
Glucose-Capillary: 116 mg/dL — ABNORMAL HIGH (ref 70–99)
Glucose-Capillary: 214 mg/dL — ABNORMAL HIGH (ref 70–99)

## 2020-10-28 LAB — HEMOGLOBIN A1C
Hgb A1c MFr Bld: >15.5 % — ABNORMAL HIGH (ref 4.8–5.6)
Mean Plasma Glucose: >398 mg/dL

## 2020-10-28 LAB — MAGNESIUM: Magnesium: 1.8 mg/dL (ref 1.7–2.4)

## 2020-10-28 MED ORDER — CLOPIDOGREL BISULFATE 75 MG PO TABS: 75.0000 mg | ORAL_TABLET | Freq: Every day | ORAL | Status: DC

## 2020-10-28 MED ORDER — ASPIRIN 81 MG PO CHEW: 81.0000 mg | CHEWABLE_TABLET | Freq: Every day | ORAL | Status: DC

## 2020-10-28 MED ORDER — LEVETIRACETAM ER 500 MG PO TB24
1000.0000 mg | ORAL_TABLET | Freq: Every day | ORAL | Status: DC
  Administered 2020-10-28: 1000 mg via ORAL
  Filled 2020-10-28 (×2): qty 2

## 2020-10-28 MED ORDER — POTASSIUM CHLORIDE CRYS ER 20 MEQ PO TBCR
40.0000 meq | EXTENDED_RELEASE_TABLET | Freq: Once | ORAL | Status: AC
  Administered 2020-10-28: 40 meq via ORAL
  Filled 2020-10-28: qty 2

## 2020-10-28 MED ORDER — ASPIRIN 81 MG PO CHEW
81.0000 mg | CHEWABLE_TABLET | Freq: Once | ORAL | Status: AC
  Administered 2020-10-28: 81 mg via ORAL
  Filled 2020-10-28: qty 1

## 2020-10-28 MED ORDER — INSULIN ASPART PROT & ASPART (70-30 MIX) 100 UNIT/ML PEN: 20.0000 [IU] | PEN_INJECTOR | Freq: Two times a day (BID) | SUBCUTANEOUS | 11 refills | Status: AC

## 2020-10-28 MED ORDER — CLOPIDOGREL BISULFATE 75 MG PO TABS
300.0000 mg | ORAL_TABLET | Freq: Once | ORAL | Status: AC
  Administered 2020-10-28: 300 mg via ORAL
  Filled 2020-10-28: qty 4

## 2020-10-28 MED ORDER — BLOOD GLUCOSE METER KIT: PACK | 0 refills | Status: AC

## 2020-10-28 MED ORDER — INSULIN STARTER KIT- PEN NEEDLES (ENGLISH): 1.0000 | Freq: Once | 0 refills | Status: AC

## 2020-10-28 MED ORDER — INSULIN STARTER KIT- PEN NEEDLES (ENGLISH)
1.0000 | Freq: Once | Status: AC
  Administered 2020-10-28: 1
  Filled 2020-10-28: qty 1

## 2020-10-28 MED ORDER — LEVETIRACETAM ER 1000 MG PO TB24: 1000.0000 mg | ORAL_TABLET | Freq: Every day | ORAL | 0 refills | Status: AC

## 2020-10-28 MED ORDER — INSULIN PEN NEEDLE 32G X 4 MM MISC: 1.0000 "application " | Freq: Two times a day (BID) | 0 refills | Status: AC

## 2020-10-28 MED ORDER — MAGNESIUM SULFATE 2 GM/50ML IV SOLN
2.0000 g | Freq: Once | INTRAVENOUS | Status: AC
  Administered 2020-10-28: 2 g via INTRAVENOUS
  Filled 2020-10-28: qty 50

## 2020-10-28 NOTE — Discharge Instructions (Addendum)
Continue aspirin 81 mg and plavix 75 mg daily at discharge. Continue Keppra 1000 mg every day   No driving for 6 months.

## 2020-10-28 NOTE — TOC CAGE-AID Note (Signed)
Transition of Care [facility]) - CAGE-AID Screening   Patient Details  Name: Alan Diaz MRN: [id number] Date of Birth: November 25, 1965  [facility] CM/SW Contact:    Emeterio Reeve, [place] Phone Number: 10/28/2020, 4:16 PM   Clinical Narrative:  CSW met with pt at bedside. CSW introduced self and explained her role at the hospital.  Pt denies alcohol use. Pt reports occasional marijuana use. CSW informed pt he was positive for Benzos, pt denied using anything other than marijuana. Pt declined resources.   CAGE-AID Screening:    Have You Ever Felt You Ought to Cut Down on Your Drinking or Drug Use?: No Have People Annoyed You By Critizing Your Drinking Or Drug Use?: No Have You Felt Bad Or Guilty About Your Drinking Or Drug Use?: No Have You Ever Had a Drink or Used Drugs First Thing In The Morning to Steady Your Nerves or to Get Rid of a Hangover?: No CAGE-AID Score: 0  Substance Abuse Education Offered: Yes      Blima Ledger, [place] Social Worker [phone]

## 2020-10-28 NOTE — Evaluation (Signed)
Physical Therapy Evaluation Patient Details Name: Alan Diaz MRN: [id number] DOB: [birth year] Today's Date: 10/28/2020   History of Present Illness  The pt is a 55 yo male presenting from home due to x2 witnessed seizures. Upon admission pt also with L-sided weakness, facial droop, slurred speech, and CBG >500. 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 PMH includes: CAD, NSTEMI, s/p CABG, CHF, CKD III, DM II, HLD, HTN, ischemic cardiomyopathy, and current tobacco use.   Clinical Impression  Pt in bed upon arrival of PT, agreeable to evaluation at this time. Prior to admission the pt was independent with all mobility, receiving some assist from significant other for home making due to minor memory deficits at baseline. The pt now presents with minor limitations in safety awareness and functional mobility due to above dx, but is safe to d/c home with family support as needed. The pt was able to complete good hallway ambulation and stair navigation without assist or evidence of instability, and scored 23/24 on Dynamic Gait Index which indicates the pt is not at increase risk for falls. The pt will continue to benefit from PT acutely to further progress functional mobility and integration of cognitive tasks with mobility, but the pt is safe to return home without continued intervention.     Follow Up Recommendations No PT follow up;Supervision for mobility/OOB    Equipment Recommendations  None recommended by PT    Recommendations for Other Services       Precautions / Restrictions Precautions Precautions: None Restrictions Weight Bearing Restrictions: No      Mobility  Bed Mobility Overal bed mobility: Independent                  Transfers Overall transfer level: Independent                  Ambulation/Gait Ambulation/Gait assistance: Supervision Gait Distance (Feet): 250 Feet Assistive device:  None Gait Pattern/deviations: Step-through pattern;WFL(Within Functional Limits) Gait velocity: 0.7 m/s Gait velocity interpretation: 1.31 - 2.62 ft/sec, indicative of limited community ambulator General Gait Details: generally functional, even with higher level balance challenge. modI in room  Stairs Stairs: Yes Stairs assistance: Min guard Stair Management: No rails;Alternating pattern Number of Stairs: 12 (+ 10) General stair comments: alternating without rail, intermittent use of rail descending  Wheelchair Mobility    Modified Rankin (Stroke Patients Only) Modified Rankin (Stroke Patients Only) Pre-Morbid Rankin Score: No symptoms Modified Rankin: Moderate disability     Balance Overall balance assessment: No apparent balance deficits (not formally assessed)                               Standardized Balance Assessment Standardized Balance Assessment : Dynamic Gait Index   Dynamic Gait Index Level Surface: Normal Change in Gait Speed: Normal Gait with Horizontal Head Turns: Normal Gait with Vertical Head Turns: Normal Gait and Pivot Turn: Normal Step Over Obstacle: Mild Impairment Step Around Obstacles: Normal Steps: Normal Total Score: 23       Pertinent Vitals/Pain Pain Assessment: No/denies pain    Home Living Family/patient expects to be discharged to:: Private residence Living Arrangements: Spouse/significant other Available Help at Discharge: Family;Available PRN/intermittently Type of Home: House Home Access: Stairs to enter Entrance Stairs-Rails: Lawyer of Steps: 3 Home Layout: One level Home Equipment: Cane - single point      Prior Function Level of Independence: Needs assistance  Gait / Transfers Assistance Needed: independent with no AD, reports no falls or stumbles in past 6 month  ADL's / Homemaking Assistance Needed: Pt reports he is independent with ADLs.  Steffanie Rainwater' manages his medications.  He  drives, does not work due to cardiac history.  He enjoys carpentry work.  He does endorse memory difficulties        Hand Dominance   Dominant Hand: Right    Extremity/Trunk Assessment   Upper Extremity Assessment Upper Extremity Assessment: Overall WFL for tasks assessed    Lower Extremity Assessment Lower Extremity Assessment: Overall WFL for tasks assessed    Cervical / Trunk Assessment Cervical / Trunk Assessment: Normal  Communication   Communication: No difficulties  Cognition Arousal/Alertness: Awake/alert Behavior During Therapy: WFL for tasks assessed/performed Overall Cognitive Status: History of cognitive impairments - at baseline                                 General Comments: Pt with a few instances of STM deficits during session, difficulty with STM/wayfinding as he could not recognize or remember his room without cues. Difficulty with multi-step commands      General Comments General comments (skin integrity, edema, etc.): vss on RA    Exercises     Assessment/Plan    PT Assessment Patient needs continued PT services  PT Problem List Decreased balance;Decreased mobility;Decreased safety awareness;Decreased cognition       PT Treatment Interventions Stair training;Gait training;Cognitive remediation    PT Goals (Current goals can be found in the Care Plan section)  Acute Rehab PT Goals Patient Stated Goal: to go home  PT Goal Formulation: With patient Time For Goal Achievement: 11/11/20 Potential to Achieve Goals: Good    Frequency Min 4X/week    AM-PAC PT "6 Clicks" Mobility  Outcome Measure Help needed turning from your back to your side while in a flat bed without using bedrails?: None Help needed moving from lying on your back to sitting on the side of a flat bed without using bedrails?: None Help needed moving to and from a bed to a chair (including a wheelchair)?: None Help needed standing up from a chair using your arms  (e.g., wheelchair or bedside chair)?: None Help needed to walk in hospital room?: None Help needed climbing 3-5 steps with a railing? : A Little 6 Click Score: 23    End of Session Equipment Utilized During Treatment: Gait belt Activity Tolerance: Patient tolerated treatment well Patient left: in chair;with call bell/phone within reach Nurse Communication: Mobility status PT Visit Diagnosis: Other abnormalities of gait and mobility (R26.89)    Time: 2836-6294 PT Time Calculation (min) (ACUTE ONLY): 14 min   Charges:   PT Evaluation $PT Eval Low Complexity: 1 Low          Rolm Baptise, PT, DPT   Acute Rehabilitation Department Pager #: [phone]  Gaetana Michaelis 10/28/2020, 10:24 AM

## 2020-10-28 NOTE — Progress Notes (Addendum)
STROKE TEAM PROGRESS NOTE   INTERVAL HISTORY Seen at bedside working with PT. He is feeling much better today, strength increased. He feels his vision has improved significantly.  He states that he was taking a baby aspirin at home but is not sure about plavix. He was not taking his medications consistently.  MRI confirms right occipital infarct and MRA shows right vertebral occlusion likely from dissection. Vitals:   10/28/20 0500 10/28/20 0600 10/28/20 0700 10/28/20 0754  BP: 119/78 119/80 131/89   Pulse: 72 70 77 71  Resp: 11  16 17   Temp:      TempSrc:      SpO2: 95% 100% 93% 97%  Weight: 86.4 kg     Height:       CBC:  Recent Labs  Lab 10/27/20 0513 10/27/20 0546 10/27/20 0825 10/28/20 0147  WBC 9.5   < > 9.0 7.3  NEUTROABS 4.6  --   --   --   HGB 15.8   < > 14.7 13.9  HCT 51.0   < > 43.6 41.3  MCV 102.8*   < > 95.8 95.6  PLT 276   < > 222 216   < > = values in this interval not displayed.   Basic Metabolic Panel:  Recent Labs  Lab 10/27/20 0825 10/28/20 0147  NA 137 136  K 3.8 3.4*  CL 103 104  CO2 23 24  GLUCOSE 325* 197*  BUN 21* 13  CREATININE 1.58* 1.21  CALCIUM 9.1 8.3*  MG 2.3 1.8  PHOS 2.6 2.7   Urine Drug Screen:  Recent Labs  Lab 10/27/20 0826  LABOPIA NONE DETECTED  COCAINSCRNUR NONE DETECTED  LABBENZ POSITIVE*  AMPHETMU NONE DETECTED  THCU POSITIVE*  LABBARB NONE DETECTED    Alcohol Level No results for input(s): ETH in the last 168 hours.  IMAGING past 24 hours MR ANGIO HEAD WO CONTRAST  Result Date: 10/27/2020 CLINICAL DATA:  Visual field defect.  Seizure. EXAM: MR HEAD WITHOUT CONTRAST MR CIRCLE OF WILLIS WITHOUT CONTRAST MRA OF THE NECK WITHOUT AND WITH CONTRAST TECHNIQUE: Multiplanar, multiecho pulse sequences of the brain, circle of willis and surrounding structures were obtained without intravenous contrast. Angiographic images of the neck were obtained using MRA technique without and with intravenous contrast. CONTRAST:  8.50mL  GADAVIST GADOBUTROL 1 MMOL/ML IV SOLN COMPARISON:  None. FINDINGS: MR HEAD FINDINGS Brain: There is a small acute/early subacute infarct of the right occipital lobe. No other site of acute ischemia. There is petechial hemorrhage of the infarct site. Normal white matter signal. Normal volume of CSF spaces. No chronic microhemorrhage. Normal midline structures. There is no abnormal contrast enhancement. Vascular: Normal flow voids. Skull and upper cervical spine: Normal marrow signal. Sinuses/Orbits: Negative. Other: None. MR CIRCLE OF WILLIS FINDINGS POSTERIOR CIRCULATION: --Vertebral arteries: No flow related enhancement of the right V4 segment proximal to the PICA takeoff. Otherwise normal. --Inferior cerebellar arteries: Normal. --Basilar artery: Normal. --Superior cerebellar arteries: Normal. --Posterior cerebral arteries: Normal. ANTERIOR CIRCULATION: --Intracranial internal carotid arteries: Normal. --Anterior cerebral arteries (ACA): Normal. --Middle cerebral arteries (MCA): Normal. ANATOMIC VARIANTS: Fetal origin of the left PCA. MRA NECK FINDINGS The right vertebral artery is occluded along its entire cervical course. The left vertebral artery is normal. Both carotid systems are normal. IMPRESSION: 1. Small acute/early subacute infarct of the right occipital lobe with petechial hemorrhage. Heidelberg classification 1b: HI2, confluent petechiae, no mass effect. 2. Occlusion of the right vertebral artery V1-V3 segments and proximal V4 segment, likely indicating  dissection. 3. Otherwise normal MRA of the head and neck. Electronically Signed   By: Deatra Robinson M.D.   On: 10/27/2020 19:30   MR ANGIO NECK W WO CONTRAST  Result Date: 10/27/2020 CLINICAL DATA:  Visual field defect.  Seizure. EXAM: MR HEAD WITHOUT CONTRAST MR CIRCLE OF WILLIS WITHOUT CONTRAST MRA OF THE NECK WITHOUT AND WITH CONTRAST TECHNIQUE: Multiplanar, multiecho pulse sequences of the brain, circle of willis and surrounding structures were  obtained without intravenous contrast. Angiographic images of the neck were obtained using MRA technique without and with intravenous contrast. CONTRAST:  8.92mL GADAVIST GADOBUTROL 1 MMOL/ML IV SOLN COMPARISON:  None. FINDINGS: MR HEAD FINDINGS Brain: There is a small acute/early subacute infarct of the right occipital lobe. No other site of acute ischemia. There is petechial hemorrhage of the infarct site. Normal white matter signal. Normal volume of CSF spaces. No chronic microhemorrhage. Normal midline structures. There is no abnormal contrast enhancement. Vascular: Normal flow voids. Skull and upper cervical spine: Normal marrow signal. Sinuses/Orbits: Negative. Other: None. MR CIRCLE OF WILLIS FINDINGS POSTERIOR CIRCULATION: --Vertebral arteries: No flow related enhancement of the right V4 segment proximal to the PICA takeoff. Otherwise normal. --Inferior cerebellar arteries: Normal. --Basilar artery: Normal. --Superior cerebellar arteries: Normal. --Posterior cerebral arteries: Normal. ANTERIOR CIRCULATION: --Intracranial internal carotid arteries: Normal. --Anterior cerebral arteries (ACA): Normal. --Middle cerebral arteries (MCA): Normal. ANATOMIC VARIANTS: Fetal origin of the left PCA. MRA NECK FINDINGS The right vertebral artery is occluded along its entire cervical course. The left vertebral artery is normal. Both carotid systems are normal. IMPRESSION: 1. Small acute/early subacute infarct of the right occipital lobe with petechial hemorrhage. Heidelberg classification 1b: HI2, confluent petechiae, no mass effect. 2. Occlusion of the right vertebral artery V1-V3 segments and proximal V4 segment, likely indicating dissection. 3. Otherwise normal MRA of the head and neck. Electronically Signed   By: Deatra Robinson M.D.   On: 10/27/2020 19:30   MR BRAIN WO CONTRAST  Result Date: 10/27/2020 CLINICAL DATA:  Visual field defect.  Seizure. EXAM: MR HEAD WITHOUT CONTRAST MR CIRCLE OF WILLIS WITHOUT CONTRAST  MRA OF THE NECK WITHOUT AND WITH CONTRAST TECHNIQUE: Multiplanar, multiecho pulse sequences of the brain, circle of willis and surrounding structures were obtained without intravenous contrast. Angiographic images of the neck were obtained using MRA technique without and with intravenous contrast. CONTRAST:  8.33mL GADAVIST GADOBUTROL 1 MMOL/ML IV SOLN COMPARISON:  None. FINDINGS: MR HEAD FINDINGS Brain: There is a small acute/early subacute infarct of the right occipital lobe. No other site of acute ischemia. There is petechial hemorrhage of the infarct site. Normal white matter signal. Normal volume of CSF spaces. No chronic microhemorrhage. Normal midline structures. There is no abnormal contrast enhancement. Vascular: Normal flow voids. Skull and upper cervical spine: Normal marrow signal. Sinuses/Orbits: Negative. Other: None. MR CIRCLE OF WILLIS FINDINGS POSTERIOR CIRCULATION: --Vertebral arteries: No flow related enhancement of the right V4 segment proximal to the PICA takeoff. Otherwise normal. --Inferior cerebellar arteries: Normal. --Basilar artery: Normal. --Superior cerebellar arteries: Normal. --Posterior cerebral arteries: Normal. ANTERIOR CIRCULATION: --Intracranial internal carotid arteries: Normal. --Anterior cerebral arteries (ACA): Normal. --Middle cerebral arteries (MCA): Normal. ANATOMIC VARIANTS: Fetal origin of the left PCA. MRA NECK FINDINGS The right vertebral artery is occluded along its entire cervical course. The left vertebral artery is normal. Both carotid systems are normal. IMPRESSION: 1. Small acute/early subacute infarct of the right occipital lobe with petechial hemorrhage. Heidelberg classification 1b: HI2, confluent petechiae, no mass effect. 2. Occlusion of the right  vertebral artery V1-V3 segments and proximal V4 segment, likely indicating dissection. 3. Otherwise normal MRA of the head and neck. Electronically Signed   By: Deatra Robinson M.D.   On: 10/27/2020 19:30   EEG  adult  Result Date: 10/27/2020 Charlsie Quest, MD     10/27/2020  3:08 PM Patient Name: Alan Diaz MRN: [id number] Epilepsy Attending: Charlsie Quest Referring Physician/Provider: Dr. Delia Heady Date: 10/27/2020 Duration: 23.15 mins Patient history: 55 year old male presented with left visual field cut and sided weakness with left gaze deviation in setting of fingerstick glucose more than 500.  EEG to evaluate for seizures. Level of alertness: Awake AEDs during EEG study: Keppra Technical aspects: This EEG study was done with scalp electrodes positioned according to the 10-20 International system of electrode placement. Electrical activity was acquired at a sampling rate of 500Hz  and reviewed with a high frequency filter of 70Hz  and a low frequency filter of 1Hz . EEG data were recorded continuously and digitally stored. Description: No clear posterior dominant rhythm was seen.  EEG showed generalized mixed frequencies with predominantly 9 to 13 Hz generalized alpha-beta activity admixed with 2 to 3 Hz generalized delta activity.  Hyperventilation and photic stimulation were not performed.   ABNORMALITY -Continuous slow, generalized IMPRESSION: This study is suggestive of moderate diffuse encephalopathy, nonspecific etiology. No seizures or epileptiform discharges were seen throughout the recording.   ECHOCARDIOGRAM COMPLETE  Result Date: 10/27/2020    ECHOCARDIOGRAM REPORT   Patient Name:   LEALAND ELTING Date of Exam: 10/27/2020 Medical Rec #:  10/29/2020      Height:       72.0 in Accession #:    [name]     Weight:       190.5 lb Date of Birth:  02-23-1965      BSA:          2.087 m Patient Age:    55 years       BP:           130/92 mmHg Patient Gender: M              HR:           68 bpm. Exam Location:  Inpatient Procedure: 2D Echo, Cardiac Doppler and Color Doppler Indications:    Stroke 434.91 / I163.9  History:        Patient has prior history of Echocardiogram  examinations, most                 recent 09/13/2017. CHF and Cardiomyopathy, CAD and Previous                 Myocardial Infarction; Risk Factors:Hypertension, Diabetes,                 Dyslipidemia and Current Smoker. CKD. GERD.  Sonographer:    06/14/1965 Dance Referring Phys: [id number] Aayushi Solorzano S Surabhi Gadea IMPRESSIONS  1. Left ventricular ejection fraction, by estimation, is 20%. The left ventricle has severely decreased function. The left ventricle demonstrates global hypokinesis. The left ventricular internal cavity size was moderately dilated. Left ventricular diastolic parameters are consistent with Grade II diastolic dysfunction (pseudonormalization). Elevated left ventricular end-diastolic pressure.  2. Right ventricular systolic function is mildly reduced. The right ventricular size is normal. There is normal pulmonary artery systolic pressure. The estimated right ventricular systolic pressure is 21.0 mmHg.  3. Left atrial size was mildly dilated.  4. The mitral valve is normal in structure. Trivial mitral valve regurgitation.  No evidence of mitral stenosis.  5. The aortic valve is tricuspid. Aortic valve regurgitation is trivial. Mild aortic valve sclerosis is present, with no evidence of aortic valve stenosis.  6. The inferior vena cava is normal in size with greater than 50% respiratory variability, suggesting right atrial pressure of 3 mmHg. FINDINGS  Left Ventricle: Left ventricular ejection fraction, by estimation, is 20%. The left ventricle has severely decreased function. The left ventricle demonstrates global hypokinesis. The left ventricular internal cavity size was moderately dilated. There is  no left ventricular hypertrophy. Left ventricular diastolic parameters are consistent with Grade II diastolic dysfunction (pseudonormalization). Elevated left ventricular end-diastolic pressure. Right Ventricle: The right ventricular size is normal. No increase in right ventricular wall thickness. Right ventricular  systolic function is mildly reduced. There is normal pulmonary artery systolic pressure. The tricuspid regurgitant velocity is 2.12 m/s, and with an assumed right atrial pressure of 3 mmHg, the estimated right ventricular systolic pressure is 21.0 mmHg. Left Atrium: Left atrial size was mildly dilated. Right Atrium: Right atrial size was normal in size. Pericardium: There is no evidence of pericardial effusion. Mitral Valve: The mitral valve is normal in structure. Mild mitral annular calcification. Trivial mitral valve regurgitation. No evidence of mitral valve stenosis. Tricuspid Valve: The tricuspid valve is normal in structure. Tricuspid valve regurgitation is trivial. Aortic Valve: The aortic valve is tricuspid. Aortic valve regurgitation is trivial. Mild aortic valve sclerosis is present, with no evidence of aortic valve stenosis. Pulmonic Valve: The pulmonic valve was normal in structure. Pulmonic valve regurgitation is trivial. Aorta: The aortic root is normal in size and structure. Venous: The inferior vena cava is normal in size with greater than 50% respiratory variability, suggesting right atrial pressure of 3 mmHg. IAS/Shunts: No atrial level shunt detected by color flow Doppler.  LEFT VENTRICLE PLAX 2D LVIDd:         6.63 cm  Diastology LVIDs:         5.73 cm  LV e' medial:    4.03 cm/s LV PW:         1.43 cm  LV E/e' medial:  30.5 LV IVS:        0.90 cm  LV e' lateral:   6.42 cm/s LVOT diam:     2.30 cm  LV E/e' lateral: 19.2 LV SV:         44 LV SV Index:   21 LVOT Area:     4.15 cm  RIGHT VENTRICLE            IVC RV Basal diam:  2.85 cm    IVC diam: 1.84 cm RV S prime:     7.94 cm/s TAPSE (M-mode): 1.3 cm LEFT ATRIUM             Index       RIGHT ATRIUM           Index LA diam:        4.40 cm 2.11 cm/m  RA Area:     18.00 cm LA Vol (A2C):   86.8 ml 41.59 ml/m RA Volume:   50.80 ml  24.34 ml/m LA Vol (A4C):   68.2 ml 32.68 ml/m LA Biplane Vol: 78.3 ml 37.52 ml/m  AORTIC VALVE LVOT Vmax:    66.20 cm/s LVOT Vmean:  43.700 cm/s LVOT VTI:    0.107 m  AORTA Ao Asc diam: 3.40 cm MITRAL VALVE  TRICUSPID VALVE MV Area (PHT): 3.85 cm     TR Peak grad:   18.0 mmHg MV Decel Time: 197 msec     TR Vmax:        212.00 cm/s MV E velocity: 123.00 cm/s MV A velocity: 24.50 cm/s   SHUNTS MV E/A ratio:  5.02         Systemic VTI:  0.11 m                             Systemic Diam: 2.30 cm Marca Ancona MD Electronically signed by Marca Ancona MD Signature Date/Time: 10/27/2020/5:47:21 PM    Final     PHYSICAL EXAM Constitution: alert and oriented, standing, well-nourished   Eyes: no icterus or injection  Cardio: regular rate and rhythm Respiratory: non-labored breathing, on room air Neuro: Mental Status: Patient is awake, alert, oriented to person, place, month, year, and situation. Speech-shows no dysarthria or aphasia. Patient is able to give a clear history of events. Able to follow commands Cranial Nerves: II: visual fields intact  III,IV, VI: EOMI without ptosis or diploplia. Pupils equal, round and reactive to light V: Facial sensation is symmetric to temperature VII: no facial droop  VIII: hearing is intact to voice X: Palate elevates symmetrically XI: Shoulder shrug is symmetric. XII: tongue is midline without atrophy or fasciculations.  Motor: Strength is 5/5 all extremities  ASSESSMENT/PLAN Alan Diaz is a 55 y.o. male with history of CAD and MI s/p CABG 2018, TIIDM, and fall 2 weeks ago where he hit the left side of his head with subsequent L visual field cut, now presenting with L sided weakness and slurred speech and experiencing two witness seizures with left sided gaze deviation and loss of bladder control with hyperglycemia >700. Seizure resolved with 2.5 mg IV versed.   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  MRI/MRI  10/28 showed acute/early subacute infarct of the right occipital lobe and occlusion of right V1-V4 segments indicating arterial dissection  Previously on asa 81 mg, unsure if he is taking plavix 75 mg qd at home. He states he was not taking these consistently. Start plavix 300 mg loading dose, then 75 mg qd and asa 81 mg for three months, then aspirin alone.  2D Echo without thrombus noted, LVEF 20% w/global hypokinesis, elevated LVEDP and mildly reduced RV. Last echo 08/2017 showed EF 25-30% with diffuse hypokinesis  LDL 108  HgbA1c pending  VTE prophylaxis - SCDs    Diet   Diet heart healthy/carb modified Room service appropriate? Yes with Assist; Fluid consistency: Thin    aspirin 81 mg daily and clopidogrel 75 mg daily prior to admission, now on No antithrombotic.   PT/OT   Therapy recommendations: home health  Disposition:  Patient will follow-up in the neurology clinic in six weeks. Continue asa/plavix for 3 months, then aspirin alone. Neurology will sign off.   Provoked Seizure Possibly provoked by HHS with hyperglycemia >700, which can lower seizure threshold. Also cannot rule out vertebral artery dissection with recent fall and additional infarcts not seen on CT.   Loaded with keppra IV  Increase keppra xr to 1g/day  EEG with diffuse encephalopathy, nonspecific, no seizures or epileptiform discharges Per [facility] statutes, patients with seizures are not allowed to drive until  they have been seizure-free for six  months. Use caution when using heavy equipment or power tools. Avoid working on ladders or at heights. Take showers instead of baths. Ensure the water temperature is not too high on the home water heater. Do not go swimming alone. When caring for infants or small children, sit down when holding, feeding, or changing them to minimize risk of injury to the child in the event you have a seizure. Also, Maintain good sleep hygiene. Avoid  alcohol.  Hypertension  Home medications include pacerone 200 mg qd, coreg 12.5 mg bid, lasix 40 mg qd, and entresto 24-26 mg bid   Stable . Normotensive blood pressure control with vertebral artery dissection   Hyperlipidemia  Home meds:  lipitor 80  resumed in hospital  LDL 108, goal < 70  Continue statin at discharge.   Diabetes type II Uncontrolled Hyperosmotic Hyperglycemia - resolved hyperglycemic to >700 at admission, now off insulin gtt. Had shoulder injection earlier this week with orthopedics and was told his glucose might increase.   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 Agree with note above.  Patient likely had a post traumatic right vertebral artery dissection when he fell a week ago and hurt his shoulder in an.  Recommend dual antiplatelet therapy of aspirin and Plavix for 3 months followed by aspirin alone and aggressive risk factor modification.  Continue Keppra for seizure prophylaxis.  Follow-up as an outpatient stroke clinic in 6 weeks.  Greater than 50% time during this 25-minute visit was spent on counseling and coordination of care about his vertebral artery dissection stroke seizure prophylaxis.  Discussed with Canary Brim critical care nurse practitioner.  Stroke team will sign off.  Kindly call for  questions.  Delia Heady, MD Medical Director [facility] Pager: [phone] 10/28/2020 12:59 PM   To contact Stroke Continuity provider, please refer to [email or web address]. After hours, contact General Neurology

## 2020-10-28 NOTE — Discharge Summary (Addendum)
Physician Discharge Summary  Patient ID: Alan Diaz MRN: [id number] DOB/AGE: February 16, 1965 55 y.o.  Admit date: 10/27/2020 Discharge date: 10/28/2020    Discharge Diagnoses:  Right Occipital Lobe Hemorrhage Right Vetebral Artery Dissection Seizure  AKI  Anion Gap Metabolic Acidosis Hypokalemia  Hypomagnesemia  Hyperglycemia   Diabetes Mellitus - Poorly Controlled, Hgb A1c >15.5 Hx CAD s/p CABG  HLD Remote Blowout Fracture of Left Orbit                                                                       DISCHARGE PLAN BY DIAGNOSIS      Right Occipital Lobe Hemorrhage Right Vetebral Artery Dissection  Discharge Plan: -Plavix + ASA for 3 months, then ASA alone  -follow up Neurology as outpatient  -No driving for 6 months with visual field cut per Neurology recommendation  Seizure   Discharge Plan: -suspect secondary to hyperglycemia / CVA  -continue Keppra 1000 mg QD  -no driving for 6 months   AKI  Anion Gap Metabolic Acidosis Hypokalemia  Hypomagnesemia   Discharge Plan: Resolved, no acute follow up   Hyperglycemia   Diabetes Mellitus - Poorly Controlled, Hgb A1c >15.5  Discharge Plan: Novolog 70/30 20 units BID  Rx given for insulin pen needles, starter pack and CBG meter / supplies  Follow up with PCP regarding glucose control   Hx CAD s/p CABG  HLD   Discharge Plan: -continue home amiodarone, atorvastatin, carvedilol, entresto, lasix -follow up with Cardiology per routine   Remote Blowout Fracture of Left Orbit   Discharge Plan: No acute follow up at discharge                      DISCHARGE SUMMARY   55 y/o M admitted 10/28 with new onset seizure episodes x2, facial droop, slurred speech and left sided weakness.  He is medically non-compliant and carries a hx of CABG 03/2017, cardiomyopathy with LVEF 25% and ongoing smoking.  He reportedly fell twice in the week prior to presentation where he hit the left side of his head on a wall.   He noticed visual changes on the left related to his peripheral vision, spinning and flashes of light.    Initial evaluation found him to have significantly elevated glucose (>700).  He was treated with IVF and insulin for glucose control. COVID screening negative.  On initial CT head evaluation, he became combative.  CT head demonstrated no acute findings but showed an age-indeterminate right occipital cortex infarct which is small & remote blowout fracture of the medial wall left orbit. Subsequent MRI / MRA of the head & neck showed small acute / early subacute infarct of the right occipital lobe with petechial hemorrhage, occlusion of the right vertebral artery V1-V3 segments and proximal V4 segment likely indicating dissection. He was evaluated by Neurology with recommendation for Keppra for seizure and Plavix + ASA in the setting of CVA.  Hemoglobin A1c was >15.5.  UDS was positive for THC. The patient's glucose was quickly controlled. He was evaluated by the Diabetes Coordinator with recommendations for 20 units BID of 70/30, meter kit and supplies.  Physical therapy / Occupation therapy evaluation prior to discharge with no home needs recommended.  The patient  was medically cleared for discharge 10/29 with plans as above. Follow up arranged for patient at the [facility].             SIGNIFICANT DIAGNOSTIC STUDIES  CT Head Code Stroke 10/28 >> no acute findings but showed an age-indeterminate right occipital cortex infarct which is small & remote blowout fracture of the medial wall left orbit.   MRI / MRA Brain & Neck 10/28 >> small acute / early subacure infarct of the right occipital lobe with petechial hemorrhage, occlusion of the right vertebral artery V1-V3 segments and proximal V4 segment likely indicating dissection.   ECHO 10/28 >> LVEF 20%, LV has severely reduced function with global hypokinesis, Grade II diastolic dysfunction, elevated LVEDP, RV systolic function  mildly reduced, LA mildly dilated   MICRO DATA  COVID 10/28 >> negative  Influenza A/B 10/28 >> negative  MRSA PCR 10/28 >> negative  UC 10/28 >> negative  CONSULTS Neurology    Discharge Exam: General: adult male lying in bed in NAD HEENT: MM pink/moist Neuro: AAOx4, speech clear, MAE  CV: s1s2 RRR, no m/r/g PULM: non-labored on RA, lungs bilaterally clear  GI: soft, bsx4 active  Extremities: warm/dry, no edema  Skin: no rashes or lesions  Vitals:   10/28/20 0800 10/28/20 0900 10/28/20 1100 10/28/20 1200  BP: 119/86  (!) 131/98   Pulse: 78 75 83   Resp: 13 (!) 22 18   Temp: 98.5 F (36.9 C)   98.3 F (36.8 C)  TempSrc: Oral   Oral  SpO2: 98% 99% 95%   Weight:      Height:         Discharge Labs  BMET Recent Labs  Lab 10/27/20 0509 10/27/20 0509 10/27/20 0513 10/27/20 0513 10/27/20 0546 10/27/20 0546 10/27/20 0825 10/28/20 0147  NA 134*  --  134*  --  133*  --  137 136  K 4.1   < > 4.1   < > 4.8   < > 3.8 3.4*  CL 101  --  96*  --   --   --  103 104  CO2  --   --  11*  --   --   --  23 24  GLUCOSE >700*  --  690*  --   --   --  325* 197*  BUN 23*  --  23*  --   --   --  21* 13  CREATININE 1.70*  --  2.20*  --   --   --  1.58* 1.21  CALCIUM  --   --  9.3  --   --   --  9.1 8.3*  MG  --   --   --   --   --   --  2.3 1.8  PHOS  --   --   --   --   --   --  2.6 2.7   < > = values in this interval not displayed.    CBC Recent Labs  Lab 10/27/20 0513 10/27/20 0513 10/27/20 0546 10/27/20 0825 10/28/20 0147  HGB 15.8   < > 15.0 14.7 13.9  HCT 51.0   < > 44.0 43.6 41.3  WBC 9.5  --   --  9.0 7.3  PLT 276  --   --  222 216   < > = values in this interval not displayed.    Anti-Coagulation Recent Labs  Lab 10/27/20 0513 10/27/20 0825  INR 1.0  0.9    Allergies as of 10/28/2020      Reactions   Other Nausea And Vomiting, Other (See Comments)   Grape products      Medication List    TAKE these medications   acetaminophen 325 MG  tablet Commonly known as: TYLENOL Take 650 mg by mouth every 6 (six) hours as needed for mild pain.   amiodarone 200 MG tablet Commonly known as: PACERONE TAKE 1 TABLET(200 MG) BY MOUTH DAILY   aspirin EC 81 MG tablet Take 81 mg by mouth daily.   atorvastatin 80 MG tablet Commonly known as: LIPITOR Take 1 tablet (80 mg total) by mouth daily.   blood glucose meter kit and supplies Dispense based on patient and insurance preference. Use up to four times daily as directed. (FOR ICD-10 E10.9, E11.9).   carvedilol 12.5 MG tablet Commonly known as: COREG Take 1 tablet by mouth twice daily with a meal. Patient overdue for an appointment and must call and schedule for further refills1st attempt What changed:   how much to take  how to take this  when to take this  additional instructions   clopidogrel 75 MG tablet Commonly known as: PLAVIX Take 1 tablet (75 mg total) by mouth daily.   Entresto 24-26 MG Generic drug: sacubitril-valsartan Take 1 tablet by mouth 2 (two) times daily.   furosemide 40 MG tablet Commonly known as: LASIX Take 1 tablet (40 mg total) by mouth daily.   ibuprofen 200 MG tablet Commonly known as: ADVIL Take 400 mg by mouth every 6 (six) hours as needed for mild pain.   insulin aspart protamine - aspart (70-30) 100 UNIT/ML FlexPen Commonly known as: NOVOLOG 70/30 MIX Inject 0.2 mLs (20 Units total) into the skin 2 (two) times daily.   Insulin Pen Needle 32G X 4 MM Misc 1 application by Does not apply route 2 (two) times daily with a meal.   insulin starter kit- pen needles Misc 1 kit by Other route once for 1 dose.   levETIRAcetam ER 1000 MG Tb24 Take 1,000 mg by mouth daily. Start taking on: October 29, 2020   methocarbamol 500 MG tablet Commonly known as: ROBAXIN Take 500 mg by mouth every 8 (eight) hours as needed for muscle spasms.        Follow-up [facility] Follow up on 10/31/2020.    Why: Appt at 9:30 am. Please arrive at 9:15 am for check in.  Contact information: [place] [id number] [phone]       [facility] Follow up in 6 week(s).   Why: Follow-up with Dr. Leonie Man in 6 weeks Contact information: [address]     [place] [address] [phone]               Disposition: Home. No new home health needs identified at discharge.   Discharged Condition: Alan Diaz has met maximum benefit of inpatient care and is medically stable and cleared for discharge.  Patient is pending follow up as above.      Time spent on disposition:  Greater than 35 Minutes.   Signed: Noe Gens, NP-C, AGACNP-BC [facility]     CC: Cammie Fulp

## 2020-10-28 NOTE — Evaluation (Signed)
Occupational Therapy Evaluation Patient Details Name: Alan Diaz MRN: [id number] DOB: Jun 01, 1965 Today's Date: 10/28/2020    History of Present Illness The pt is a 55 yo male presenting from home due to x2 witnessed seizures. Upon admission pt also with L-sided weakness, facial droop, slurred speech, and CBG >500. 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 PMH includes: CAD, NSTEMI, s/p CABG, CHF, CKD III, DM II, HLD, HTN, ischemic cardiomyopathy, and current tobacco use.    Clinical Impression   Patient evaluated by Occupational Therapy with no further acute OT needs identified. All education has been completed and the patient has no further questions. Pt is able to perform ADLs independently.  He does demonstrate cognitive deficits, but he reports this is his baseline.  He does report frequently forgetting to take medications, and forgetting MD appointments . Discussed strategies to assist his memory.  See below for any follow-up Occupational Therapy or equipment needs. OT is signing off. Thank you for this referral.      Follow Up Recommendations  No OT follow up;Supervision - Intermittent    Equipment Recommendations  None recommended by OT    Recommendations for Other Services       Precautions / Restrictions Precautions Precautions: None      Mobility Bed Mobility Overal bed mobility: Independent                  Transfers Overall transfer level: Independent                    Balance Overall balance assessment: No apparent balance deficits (not formally assessed)                                         ADL either performed or assessed with clinical judgement   ADL Overall ADL's : Independent                                       General ADL Comments: performed bath standing at sink independently      Vision Baseline Vision/History: No visual  deficits Patient Visual Report: Peripheral vision impairment (which he reports has improved significantly since admit ) Vision Assessment?: Yes Eye Alignment: Within Functional Limits Ocular Range of Motion: Within Functional Limits Alignment/Gaze Preference: Within Defined Limits Tracking/Visual Pursuits: Able to track stimulus in all quads without difficulty Visual Fields: No apparent deficits Additional Comments: no deficits noted during confrontation testing.  Pt reports he had been seeing lights and swirling lights in his Left visual field which have improved significantly and are absent      Perception Perception Perception Tested?: Yes   Praxis Praxis Praxis tested?: Within functional limits    Pertinent Vitals/Pain Pain Assessment: No/denies pain     Hand Dominance Right   Extremity/Trunk Assessment Upper Extremity Assessment Upper Extremity Assessment: Overall WFL for tasks assessed   Lower Extremity Assessment Lower Extremity Assessment: Defer to PT evaluation   Cervical / Trunk Assessment Cervical / Trunk Assessment: Normal   Communication Communication Communication: No difficulties   Cognition Arousal/Alertness: Awake/alert Behavior During Therapy: WFL for tasks assessed/performed Overall Cognitive Status: History of cognitive impairments - at baseline  General Comments: Pt scored 10/28 on the Short Blessed Test.  He demonstrated difficulty with recall, sequencing and attention.  He does endorse difficulty with memory and reports he often forgets to take his medications, and sometimes forgets MD appointments    General Comments  Discussed memory strategies with him.  He does not have a cell phone.  His girlfriend sets up pill boxes for him.  Instructed him to think about using an alarm clock to alert him to take his pills and to use a calendar to keep track of appointments.  He verbally agreed     Exercises      Shoulder Instructions      Home Living Family/patient expects to be discharged to:: Private residence Living Arrangements: Spouse/significant other Available Help at Discharge: Family;Available PRN/intermittently Type of Home: House Home Access: Stairs to enter [employer] of Steps: 3 Entrance Stairs-Rails: Left;Right Home Layout: One level     Bathroom Shower/Tub: Chief Strategy Officer: Standard     Home Equipment: Cane - single point          Prior Functioning/Environment Level of Independence: Needs assistance  Gait / Transfers Assistance Needed: independent with no AD  ADL's / Homemaking Assistance Needed: Pt reports he is independent with ADLs.  Steffanie Rainwater' manages his medications.  He drives, does not work due to cardiac history.  He enjoys carpentry work.  He does endorse memory difficulties            OT Problem List: Decreased cognition      OT Treatment/Interventions:      OT Goals(Current goals can be found in the care plan section) Acute Rehab OT Goals Patient Stated Goal: to go home  OT Goal Formulation: All assessment and education complete, DC therapy  OT Frequency:     Barriers to D/C:            Co-evaluation              AM-PAC OT "6 Clicks" Daily Activity     Outcome Measure Help from another person eating meals?: None Help from another person taking care of personal grooming?: None Help from another person toileting, which includes using toliet, bedpan, or urinal?: None Help from another person bathing (including washing, rinsing, drying)?: None Help from another person to put on and taking off regular upper body clothing?: None Help from another person to put on and taking off regular lower body clothing?: None 6 Click Score: 24   End of Session Nurse Communication: Mobility status  Activity Tolerance: Patient tolerated treatment well Patient left: in chair;with call bell/phone within reach  OT Visit  Diagnosis: Cognitive communication deficit (R41.841) Symptoms and signs involving cognitive functions: Cerebral infarction                Time: 1224-8250 OT Time Calculation (min): 36 min Charges:  OT General Charges $OT Visit: 1 Visit OT Evaluation $OT Eval Low Complexity: 1 Low OT Treatments $Self Care/Home Management : 8-22 mins  Eber Jones., OTR/L Acute Rehabilitation Services Pager [phone] Office [phone]   Jeani Hawking M 10/28/2020, 9:44 AM

## 2020-10-28 NOTE — Progress Notes (Signed)
Spoke with patient about his diabetes. Patient is willing to take insulin, but hopes that he will not have to take for a long period of time. His fiance was also at the bedside and she takes insulin, as well. Reviewed his HgbA1C of 15.5%. stated that he had stopped taking his Metformin over a year ago and had not been checking CBGs at home. Instructed patient on how to use the insulin pen. Teach back went well with procedure. Reviewed hypoglycemia and how to treat.   Recommend Novolog 70/30 mixed insulin 20 units BID ( dosage will provide total 28 units of basal and 12 units short acting insulin)   Increased dosage slightly due to sliding scale and meal coverage that he has been getting in the hospital. Noted that he would take the 70/30 at 8 am before breakfast and around 8 pm at night. (his fiance takes her insulin at these times) Will need prescription for home blood glucose meter, strips, and lancets. Reminded him that he should always take his own insulin, not her insulin.   Patient will need to start the 70/30 insulin in the am on Saturday 10/30 since he will be getting Levemir 26 units today around 1500.   Order # [id number] Novolog 70/30 insulin Order # [id number]  32 Gauge x 4 mm insulin pen needles  Order $ [id number]  CBG meter and supplies  Smith Mince RN BSN CDE Diabetes Coordinator Pager: [phone]  8am-5pm

## 2020-10-28 NOTE — TOC Transition Note (Signed)
Transition of Care [facility]) - CM/SW Discharge Note   Patient Details  Name: Alan Diaz MRN: [id number] Date of Birth: 1965-04-19  Transition of Care [facility]) CM/SW Contact:  Glennon Mac, RN Phone Number: 10/28/2020, 12:04 PM   Clinical Narrative:  The pt is a 55 yo male presenting from home due to x2 witnessed seizures. Upon admission pt also with L-sided weakness, facial droop, slurred speech, and CBG >500. 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    PTA, pt independent and living with fiance.  PT/OT Recommending no OP follow up.  Pt states fiance able to provide assistance at discharge.  He currently has no PCP, but is agreeable to follow up at [facility] and [facility].  Will arrange follow up appointment for follow up of chronic conditions.      Final next level of care: Home/Self Care Barriers to Discharge: Barriers Resolved                         Discharge Plan and Services   Discharge Planning Services: CM Consult, [facility], Follow-up appt scheduled                                 Social Determinants of Health (SDOH) Interventions     Readmission Risk Interventions Readmission Risk Prevention Plan 10/28/2020  Post Dischage Appt Complete  Medication Screening Complete  Transportation Screening Complete  Some recent data might be hidden    Quintella Baton, RN, BSN  Trauma/Neuro ICU Case Manager [phone]

## 2020-10-28 NOTE — Progress Notes (Signed)
K+ 3.4, Mg 1.8 Replaced per protocol  

## 2020-10-31 ENCOUNTER — Inpatient Hospital Stay: Payer: Medicaid Other | Admitting: Family Medicine

## 2020-10-31 LAB — HEMOGLOBIN A1C
Hgb A1c MFr Bld: 15.2 % — ABNORMAL HIGH (ref 4.8–5.6)
Mean Plasma Glucose: 390 mg/dL

## 2020-11-01 ENCOUNTER — Telehealth: Payer: Self-pay | Admitting: Emergency Medicine

## 2020-11-01 LAB — CULTURE, BLOOD (ROUTINE X 2)
Culture: NO GROWTH
Culture: NO GROWTH
Special Requests: ADEQUATE

## 2020-11-01 NOTE — Telephone Encounter (Signed)
-----   Message from Micki Riley, MD sent at 11/01/2020  8:36 AM EDT ----- Alan Diaz inform the patient that screening test for diabetes is quite high and needs to see his primary care physician/endocrinologist for tighter diabetes control

## 2020-11-01 NOTE — Telephone Encounter (Signed)
Left message on VM to return call 

## 2020-11-01 NOTE — Progress Notes (Signed)
Kindly inform the patient that screening test for diabetes is quite high and needs to see his primary care physician/endocrinologist for tighter diabetes control

## 2020-11-02 ENCOUNTER — Telehealth: Payer: Self-pay | Admitting: Emergency Medicine

## 2020-11-02 NOTE — Telephone Encounter (Signed)
-----   Message from Pramod S Sethi, MD sent at 11/01/2020  8:36 AM EDT ----- °Kindly inform the patient that screening test for diabetes is quite high and needs to see his primary care physician/endocrinologist for tighter diabetes control °

## 2020-11-02 NOTE — Telephone Encounter (Signed)
Left message on 11/2 as well as today on VM to return call.

## 2020-11-03 ENCOUNTER — Ambulatory Visit (HOSPITAL_COMMUNITY): Payer: Medicaid Other | Attending: Internal Medicine

## 2020-11-07 NOTE — Telephone Encounter (Signed)
Wife called back, stated patient has been in hospital since he had the blood work and just came home in the last couple days.  Went over Dr. Marlis Edelson results regarding blood/diabetes.  She said he has a follow up already and will discuss that with his PCP.  She expressed appreciation.

## 2020-11-10 ENCOUNTER — Ambulatory Visit: Payer: Medicaid Other | Admitting: Internal Medicine

## 2020-11-18 ENCOUNTER — Encounter (HOSPITAL_COMMUNITY): Payer: Self-pay

## 2020-11-18 ENCOUNTER — Emergency Department (HOSPITAL_COMMUNITY)
Admission: EM | Admit: 2020-11-18 | Discharge: 2020-11-30 | Disposition: E | Payer: Medicaid Other | Attending: Emergency Medicine | Admitting: Emergency Medicine

## 2020-11-18 DIAGNOSIS — Z79899 Other long term (current) drug therapy: Secondary | ICD-10-CM | POA: Diagnosis not present

## 2020-11-18 DIAGNOSIS — I130 Hypertensive heart and chronic kidney disease with heart failure and stage 1 through stage 4 chronic kidney disease, or unspecified chronic kidney disease: Secondary | ICD-10-CM | POA: Diagnosis not present

## 2020-11-18 DIAGNOSIS — Z7982 Long term (current) use of aspirin: Secondary | ICD-10-CM | POA: Insufficient documentation

## 2020-11-18 DIAGNOSIS — Z794 Long term (current) use of insulin: Secondary | ICD-10-CM | POA: Insufficient documentation

## 2020-11-18 DIAGNOSIS — N183 Chronic kidney disease, stage 3 unspecified: Secondary | ICD-10-CM | POA: Diagnosis not present

## 2020-11-18 DIAGNOSIS — Z951 Presence of aortocoronary bypass graft: Secondary | ICD-10-CM | POA: Diagnosis not present

## 2020-11-18 DIAGNOSIS — I5042 Chronic combined systolic (congestive) and diastolic (congestive) heart failure: Secondary | ICD-10-CM | POA: Diagnosis not present

## 2020-11-18 DIAGNOSIS — I2510 Atherosclerotic heart disease of native coronary artery without angina pectoris: Secondary | ICD-10-CM | POA: Insufficient documentation

## 2020-11-18 DIAGNOSIS — I469 Cardiac arrest, cause unspecified: Secondary | ICD-10-CM | POA: Diagnosis not present

## 2020-11-18 DIAGNOSIS — E1159 Type 2 diabetes mellitus with other circulatory complications: Secondary | ICD-10-CM | POA: Insufficient documentation

## 2020-11-18 DIAGNOSIS — F17210 Nicotine dependence, cigarettes, uncomplicated: Secondary | ICD-10-CM | POA: Insufficient documentation

## 2020-11-18 LAB — MAGNESIUM: Magnesium: 2.6 mg/dL — ABNORMAL HIGH (ref 1.7–2.4)

## 2020-11-18 LAB — COMPREHENSIVE METABOLIC PANEL
ALT: <5 U/L (ref 0–44)
AST: 66 U/L — ABNORMAL HIGH (ref 15–41)
Albumin: 2.7 g/dL — ABNORMAL LOW (ref 3.5–5.0)
Alkaline Phosphatase: 83 U/L (ref 38–126)
Anion gap: 20 — ABNORMAL HIGH (ref 5–15)
BUN: 22 mg/dL — ABNORMAL HIGH (ref 6–20)
CO2: 14 mmol/L — ABNORMAL LOW (ref 22–32)
Calcium: 8.3 mg/dL — ABNORMAL LOW (ref 8.9–10.3)
Chloride: 95 mmol/L — ABNORMAL LOW (ref 98–111)
Creatinine, Ser: 2.5 mg/dL — ABNORMAL HIGH (ref 0.61–1.24)
GFR, Estimated: 29 mL/min — ABNORMAL LOW (ref >60)
Glucose, Bld: 779 mg/dL (ref 70–99)
Potassium: >7.5 mmol/L (ref 3.5–5.1)
Sodium: 129 mmol/L — ABNORMAL LOW (ref 135–145)
Total Bilirubin: 0.9 mg/dL (ref 0.3–1.2)
Total Protein: 5.5 g/dL — ABNORMAL LOW (ref 6.5–8.1)

## 2020-11-18 LAB — CBC WITH DIFFERENTIAL/PLATELET
Abs Immature Granulocytes: 0.29 10*3/uL — ABNORMAL HIGH (ref 0.00–0.07)
Basophils Absolute: 0 10*3/uL (ref 0.0–0.1)
Basophils Relative: 1 %
Eosinophils Absolute: 0 10*3/uL (ref 0.0–0.5)
Eosinophils Relative: 0 %
HCT: 42.2 % (ref 39.0–52.0)
Hemoglobin: 12.9 g/dL — ABNORMAL LOW (ref 13.0–17.0)
Immature Granulocytes: 4 %
Lymphocytes Relative: 26 %
Lymphs Abs: 2.1 10*3/uL (ref 0.7–4.0)
MCH: 32.4 pg (ref 26.0–34.0)
MCHC: 30.6 g/dL (ref 30.0–36.0)
MCV: 106 fL — ABNORMAL HIGH (ref 80.0–100.0)
Monocytes Absolute: 0.4 10*3/uL (ref 0.1–1.0)
Monocytes Relative: 5 %
Neutro Abs: 5.3 10*3/uL (ref 1.7–7.7)
Neutrophils Relative %: 64 %
Platelets: 182 10*3/uL (ref 150–400)
RBC: 3.98 MIL/uL — ABNORMAL LOW (ref 4.22–5.81)
RDW: 13.5 % (ref 11.5–15.5)
WBC: 8.1 10*3/uL (ref 4.0–10.5)
nRBC: 0 % (ref 0.0–0.2)

## 2020-11-18 LAB — PROTIME-INR
INR: 1.1 (ref 0.8–1.2)
Prothrombin Time: 13.4 seconds (ref 11.4–15.2)

## 2020-11-18 LAB — BRAIN NATRIURETIC PEPTIDE: B Natriuretic Peptide: 925.6 pg/mL — ABNORMAL HIGH (ref 0.0–100.0)

## 2020-11-18 LAB — ETHANOL: Alcohol, Ethyl (B): <10 mg/dL (ref <10)

## 2020-11-18 LAB — LACTIC ACID, PLASMA: Lactic Acid, Venous: 10.5 mmol/L (ref 0.5–1.9)

## 2020-11-18 LAB — TROPONIN I (HIGH SENSITIVITY): Troponin I (High Sensitivity): 52 ng/L — ABNORMAL HIGH (ref <18)

## 2020-11-18 MED ORDER — EPINEPHRINE 0.1 MG/10ML (10 MCG/ML) SYRINGE FOR IV PUSH (FOR BLOOD PRESSURE SUPPORT)
PREFILLED_SYRINGE | INTRAVENOUS | Status: AC | PRN
  Administered 2020-11-18: 30 ug via INTRAVENOUS
  Administered 2020-11-18: 50 ug via INTRAVENOUS

## 2020-11-18 MED ORDER — SODIUM CHLORIDE 0.9 % IV BOLUS
1000.0000 mL | Freq: Once | INTRAVENOUS | Status: AC
  Administered 2020-11-18: 1000 mL via INTRAVENOUS

## 2020-11-18 MED ORDER — NOREPINEPHRINE BITARTRATE 1 MG/ML IV SOLN
INTRAVENOUS | Status: AC | PRN
  Administered 2020-11-18: 50 ug/kg/min via INTRAVENOUS

## 2020-11-21 LAB — CULTURE, BLOOD (ROUTINE X 2): Special Requests: ADEQUATE

## 2020-11-30 ENCOUNTER — Inpatient Hospital Stay: Payer: Medicaid Other | Admitting: Family Medicine

## 2020-11-30 NOTE — ED Triage Notes (Signed)
Pt from home with ems for cardiac arrest. Pt LSN at 1550 today, had no complaints today. Pt found unresponsive. Asystole on the monitor at 1609, 10 mins of CPR given with 2 Epis and 300 amio. Pt went into v tach, 1 shock delivered at 300J. Pt also cardioverted at 100J. ROSC achieved at 1623. Pt being paced upon arrival to ED around 1710 and on epi gtt at 10 mcg/min 7.0 ETT placed by EMS BP 110/60 HR 60 pacing IO in R humerus, 18G LAC.

## 2020-11-30 NOTE — Code Documentation (Signed)
Pacer paused, asystole on the monitor, pulselss. Pacer turned back on at 140 mAmp at a rate of 60.

## 2020-11-30 NOTE — ED Notes (Signed)
Joyce Gross, girlfriend, [phone] would like an update when available

## 2020-11-30 NOTE — ED Provider Notes (Signed)
[facility] EMERGENCY DEPARTMENT Provider Note   CSN: [id number] Arrival date & time: 12-12-20  1717     History Chief Complaint  Patient presents with  . Cardiac Arrest    Alan Diaz is a 55 y.o. male with a history of CAD, CHF, CKD, diabetes, hypertension, ischemic cardiomyopathy presents after cardiac arrest.  Per report from EMS, patient was last seen normal by family at 3:50 PM today but was found unresponsive on the bathroom floor.  When EMS arrived they noted asystole on the monitor at 4:09 PM and started CPR.  He received a total of 10 minutes of CPR was given epi x2, and 300 of amnio.  He then went into V. tach and received 1 defibrillation shock at 300 J.  Patient then was reportedly in a V. tach rhythm with pulse so was cardioverted at 100 J.  ROSC was achieved at 4:23 PM.  Patient was placed on epi drip at 10 mcg/min and was externally paced due to intrinsic rate of 20.  During the code event, patient was also intubated by EMS with a 7.0 ET tube. Per EMS, hyperglycemic enroute. Being bagged and unresponsive on arrival.  Did not receive any prehospital sedation and not currently on sedation despite being paced and intubated.  The history is provided by the EMS personnel.       Past Medical History:  Diagnosis Date  . CAD (coronary artery disease) 2016   a. NSTEMI 5/16 s/p DES to pLAD and dRCA // b. NSTEMI 4/18 >> 95% LM, oLAD stent ok, oLCx 12, mRCA stent ok, EF < 25 >> IABP // c. s/p CABG with Dr. Lucianne Lei Trig (L-LAD, S-OM, S-PDA)  . CHF (congestive heart failure) ([place])   . Chronic combined systolic and diastolic CHF (congestive heart failure) ([place]) 05/03/2015  . CKD (chronic kidney disease) stage 3, GFR 30-59 ml/min (HCC) 04/23/2017  . Diabetes mellitus   . GERD (gastroesophageal reflux disease)   . HLD (hyperlipidemia)   . Hypertension   . Ischemic cardiomyopathy    a. 2D ECHO 05/02/15 with EF 25-30%, mild LV dilation. Mild LVH, mod HKof the  mid-apicalanteroseptal myocardium, G1DD // b. Mild LVH, EF 25, diffuse HK, grade 1 diastolic dysfunction, trivial MR // Echo 7/18: Mild concentric LVH, EF 25-30, inferior and inferolateral AK, grade 2 diastolic dysfunction, trivial MR/PI   . 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 NSTEMI (non-ST elevated myocardial infarction) ([place]) 05/05/2015  . Tobacco abuse   . Type 2 diabetes mellitus with circulatory disorder ([place]) 10/31/2008   Qualifier: Diagnosis of  By: Hassell Done FNP, Tori Milks      Patient Active Problem List   Diagnosis Date Noted  . AMS (altered mental status) 10/27/2020  . NSVT (nonsustained ventricular tachycardia) ([place]) 05/17/2017  . 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 CKD (chronic kidney disease) stage 3, GFR 30-59 ml/min (HCC) 04/23/2017  . Cardiomyopathy, ischemic 09/09/2016  . Non compliance w medication regimen 09/09/2016  . NSTEMI (non-ST elevated myocardial infarction) ([place]) 05/05/2015  . GERD (gastroesophageal reflux disease)   . HLD (hyperlipidemia)   . CAD (coronary artery disease)   . Chronic combined systolic and diastolic CHF (congestive heart failure) ([place]) 05/03/2015  . Essential hypertension 05/03/2015  . TOBACCO ABUSE 01/19/2009  . Type 2 diabetes mellitus with circulatory disorder ([place]) 10/31/2008    Past Surgical History:  Procedure Laterality Date  . CARDIAC CATHETERIZATION N/A 05/03/2015   Procedure:  Right/Left Heart Cath and Coronary Angiography;  Surgeon: Peter M Martinique, MD;  Location: [facility] INVASIVE CV [facility];  Service: Cardiovascular;  Laterality: N/A;  . CARDIAC CATHETERIZATION N/A 05/03/2015   Procedure: Intravascular Ultrasound/IVUS;  Surgeon: Peter M Martinique, MD;  Location: [facility];  Service: Cardiovascular;  Laterality: N/A;  . CARDIAC CATHETERIZATION N/A 05/03/2015   Procedure: Coronary Stent Intervention;  Surgeon: Peter M Martinique, MD;  Location: [facility] INVASIVE CV LAB [facility]UP[facility];  Service:  Cardiovascular;  Laterality: N/A;  rca  . CORONARY ARTERY BYPASS GRAFT N/A 04/24/2017   Procedure: CORONARY ARTERY BYPASS GRAFTING (CABG) times three using left intenal mammary artery and right saphenous vein;  Surgeon: Ivin Poot, MD;  Location: [facility];  Service: Open Heart Surgery;  Laterality: N/A;  . IABP INSERTION N/A 04/24/2017   Procedure: IABP Insertion;  Surgeon: Leonie Man, MD;  Location: [facility] CV LAB;  Service: Cardiovascular;  Laterality: N/A;  . LEFT HEART CATH AND CORONARY ANGIOGRAPHY N/A 04/24/2017   Procedure: Left Heart Cath and Coronary Angiography;  Surgeon: Leonie Man, MD;  Location: [facility];  Service: Cardiovascular;  Laterality: N/A;  . PERCUTANEOUS CORONARY STENT INTERVENTION (PCI-S)  05/03/2015   Procedure: Percutaneous Coronary Stent Intervention (Pci-S);  Surgeon: Peter M Martinique, MD;  Location: [facility] INVASIVE CV LAB [facility]UP[facility];  Service: Cardiovascular;;  Prox LAD  . TEE WITHOUT CARDIOVERSION N/A 04/24/2017   Procedure: TRANSESOPHAGEAL ECHOCARDIOGRAM (TEE);  Surgeon: Ivin Poot, MD;  Location: [facility];  Service: Open Heart Surgery;  Laterality: N/A;  . WISDOM TOOTH EXTRACTION     bottom LT, top RT       Family History  Problem Relation Age of Onset  . Breast cancer Mother   . Heart disease Father   . Diabetes Sister   . Hypertension Sister   . Hypertension Brother   . Diabetes Brother     Social History   Tobacco Use  . Smoking status: Current Some Day Smoker    Packs/day: 0.10    Types: Cigarettes    Last attempt to quit: 05/03/2015    Years since quitting: 5.5  . Smokeless tobacco: Never Used  Vaping Use  . Vaping Use: Never used  Substance Use Topics  . Alcohol use: No    Alcohol/week: 0.0 standard drinks    Comment: very occasionally  . Drug use: No    Types: Marijuana    Comment: denies 06/28/14    Home Medications Prior to Admission medications   Medication Sig Start Date End Date Taking? Authorizing Provider    acetaminophen (TYLENOL) 325 MG tablet Take 650 mg by mouth every 6 (six) hours as needed for mild pain.    [provider]  amiodarone (PACERONE) 200 MG tablet TAKE 1 TABLET(200 MG) BY MOUTH DAILY 06/01/20   Fay Records, MD  aspirin EC 81 MG tablet Take 81 mg by mouth daily.    [provider]  atorvastatin (LIPITOR) 80 MG tablet Take 1 tablet (80 mg total) by mouth daily. 06/01/20   Fay Records, MD  blood glucose meter kit and supplies Dispense based on patient and insurance preference. Use up to four times daily as directed. (FOR ICD-10 E10.9, E11.9). 10/28/20   Donita Brooks, NP  carvedilol (COREG) 12.5 MG tablet Take 1 tablet by mouth twice daily with a meal. Patient overdue for an appointment and must call and schedule for further refills1st attempt Patient taking differently: Take 12.5 mg by mouth  2 (two) times daily with a meal.  05/24/20   Fay Records, MD  clopidogrel (PLAVIX) 75 MG tablet Take 1 tablet (75 mg total) by mouth daily. 06/01/20   Fay Records, MD  furosemide (LASIX) 40 MG tablet Take 1 tablet (40 mg total) by mouth daily. 06/01/20   Fay Records, MD  ibuprofen (ADVIL) 200 MG tablet Take 400 mg by mouth every 6 (six) hours as needed for mild pain.    [provider]  insulin aspart protamine - aspart (NOVOLOG 70/30 MIX) (70-30) 100 UNIT/ML FlexPen Inject 0.2 mLs (20 Units total) into the skin 2 (two) times daily. 10/28/20   Donita Brooks, NP  Insulin Pen Needle 32G X 4 MM MISC 1 application by Does not apply route 2 (two) times daily with a meal. 10/28/20   Ollis, Cleaster Corin, NP  levETIRAcetam 1000 MG TB24 Take 1,000 mg by mouth daily. 10/29/20   Donita Brooks, NP  methocarbamol (ROBAXIN) 500 MG tablet Take 500 mg by mouth every 8 (eight) hours as needed for muscle spasms.  10/18/20   [provider]  sacubitril-valsartan (ENTRESTO) 24-26 MG Take 1 tablet by mouth 2 (two) times daily. 05/24/20   Isaiah Serge, NP    Allergies     Other  Review of Systems   Review of Systems  Unable to perform ROS: Patient unresponsive    Physical Exam Updated Vital Signs BP (!) 62/28 (BP Location: Right Arm)   Pulse 60   Temp (!) 85.5 F (29.7 C) (Temporal)   Resp (!) 40   Ht 6' (1.829 m)   Wt 86.4 kg   SpO2 97%   BMI 25.83 kg/m   Physical Exam Constitutional:      General: He is in acute distress.     Appearance: He is ill-appearing and toxic-appearing.  HENT:     Head: Normocephalic and atraumatic.  Eyes:     Comments: Pupils 77m, fixed  Cardiovascular:     Comments: Externally paced at 70bpm, switched to ER pacer, 140 mA at 60 bpm. Initially had palpable pedal pulses Pulmonary:     Effort: Respiratory distress (intubated) present.     Comments: Bilateral breath sounds Abdominal:     General: Abdomen is flat.     Palpations: Abdomen is soft.  Musculoskeletal:        General: No swelling, deformity or signs of injury.  Skin:    General: Skin is cool and dry.  Neurological:     Comments: No spontaneous movement     ED Results / Procedures / Treatments   Labs (all labs ordered are listed, but only abnormal results are displayed) Labs Reviewed  CULTURE, BLOOD (ROUTINE X 2)  CULTURE, BLOOD (ROUTINE X 2)  RESPIRATORY PANEL BY RT PCR (FLU A&B, COVID)  COMPREHENSIVE METABOLIC PANEL  ETHANOL  BRAIN NATRIURETIC PEPTIDE  CBC WITH DIFFERENTIAL/PLATELET  LACTIC ACID, PLASMA  LACTIC ACID, PLASMA  MAGNESIUM  URINALYSIS, ROUTINE W REFLEX MICROSCOPIC  RAPID URINE DRUG SCREEN, HOSP PERFORMED  PROTIME-INR  I-STAT ARTERIAL BLOOD GAS, ED  TROPONIN I (HIGH SENSITIVITY)    EKG None  Radiology No results found.  Procedures Procedures (including critical care time)  EMERGENCY DEPARTMENT [place] EXAM "Study: Limited Ultrasound of the Heart and Pericardium"  INDICATIONS:Cardiac arrest Multiple views of the heart and pericardium were obtained in real-time with a multi-frequency probe.  PERFORMED  BIR:CVELFYIMAGES ARCHIVED?: Yes LIMITATIONS:  Emergent procedure VIEWS USED: Subcostal 4 chamber and Apical  4 chamber  INTERPRETATION: No cardiac activity    Medications Ordered in ED Medications  norepinephrine (LEVOPHED) injection (80 mcg/kg/min  86.4 kg Intravenous Rate/Dose Change 12-09-2020 1730)  EPINEPhrine 10 mcg/mL Adult IV Push Syringe (For Blood Pressure Support) (50 mcg Intravenous Given Dec 09, 2020 1731)  sodium chloride 0.9 % bolus 1,000 mL (0 mLs Intravenous Stopped Dec 09, 2020 1736)    ED Course  I have reviewed the triage vital signs and the nursing notes.  Pertinent labs & imaging results that were available during my care of the patient were reviewed by me and considered in my medical decision making (see chart for details).    MDM Rules/Calculators/A&P                          MDM: Alan Diaz is a 55 y.o. male who presents with cardiac arrest as per above. I have reviewed the nursing documentation for past medical history, family history, and social history. Pertinent previous records reviewed. He is intubated, not on sedation, being externally paced with capture.  External pacing was switched to our monitor with appropriate capture and pulses. Pacing was paused to determine intrinsic rate.  Patient without any organized cardiac rhythm on EKG when pacing was paused.  External pacing restarted and patient initially had pulses; however, began decompensating with weaker pulses and BP 62/20.  Was requiring significant amounts of Levophed with worsening hypotension to 45/17.  Epi infusion added with max dose of 80 of Levophed and 50 of epi.  Patient hypothermic to 85.5 F.  Patient given fluids with pressure bag.  Patient remained unresponsive, not on any sedation, pupils fixed, no purposeful movement.  Echo demonstrated no cardiac activity despite vasopressors and external pacing.  Due to significant downtime, hypothermia, and exam concerning for severe hypoxic brain injury, do  not feel that patient would be able to make a significant meaningful recovery.  Time of death called by Dr. Vanita Panda 1726. Family unable to come to hospital - sister notified by Dr. Vanita Panda by phone. Per ME, will not be an ME case.  Labwork resulted after patient expired and were significant for lactic acid of 10.5, troponin 52, potassium above measurable limit (>7.5), glucose 780.  Patient has a significant cardiac history and has a history of MI.  Initial twelve-lead by EMS concerning for diffuse ST depressions although this was postarrest. Most likely etiology is presumed cardiac arrest secondary to cardiac cause.  The plan for this patient was discussed with Dr. Vanita Panda who was at bedside during patient care.   Final Clinical Impression(s) / ED Diagnoses Final diagnoses:  None    Rx / DC Orders ED Discharge Orders    None       Shaterica Mcclatchy, MD 09-Dec-2020 2346    Carmin Muskrat, MD 11/22/20 [phone]

## 2020-11-30 NOTE — Code Documentation (Signed)
Pacer paused: asystole on the monitor, pulseless.

## 2020-11-30 NOTE — ED Notes (Signed)
Time of Death at 1736 by Dr. Jeraldine Loots

## 2020-11-30 DEATH — deceased
